# Patient Record
Sex: Male | Born: 1965 | Race: White | Hispanic: No | Marital: Single | Smoking: Former smoker
Health system: Southern US, Community
[De-identification: ages and names within clinical notes are randomized; demographics above are authoritative.]

## PROBLEM LIST (undated history)

## (undated) ENCOUNTER — Emergency Department (HOSPITAL_COMMUNITY): Admission: EM | Payer: Medicaid Other | Source: Home / Self Care

## (undated) DIAGNOSIS — G7111 Myotonic muscular dystrophy: Secondary | ICD-10-CM

## (undated) DIAGNOSIS — I10 Essential (primary) hypertension: Secondary | ICD-10-CM

## (undated) DIAGNOSIS — R1310 Dysphagia, unspecified: Secondary | ICD-10-CM

## (undated) DIAGNOSIS — R066 Hiccough: Secondary | ICD-10-CM

## (undated) DIAGNOSIS — I82409 Acute embolism and thrombosis of unspecified deep veins of unspecified lower extremity: Secondary | ICD-10-CM

## (undated) DIAGNOSIS — Z87442 Personal history of urinary calculi: Secondary | ICD-10-CM

## (undated) HISTORY — PX: CHOLECYSTECTOMY: SHX55

## (undated) HISTORY — DX: Hiccough: R06.6

## (undated) HISTORY — DX: Essential (primary) hypertension: I10

## (undated) HISTORY — DX: Myotonic muscular dystrophy: G71.11

---

## 2000-04-28 ENCOUNTER — Encounter (INDEPENDENT_AMBULATORY_CARE_PROVIDER_SITE_OTHER): Payer: Self-pay | Admitting: *Deleted

## 2000-04-28 ENCOUNTER — Encounter: Payer: Self-pay | Admitting: Surgery

## 2000-04-28 ENCOUNTER — Ambulatory Visit (HOSPITAL_COMMUNITY): Admission: RE | Admit: 2000-04-28 | Discharge: 2000-04-29 | Payer: Self-pay | Admitting: Surgery

## 2002-05-15 ENCOUNTER — Ambulatory Visit (HOSPITAL_COMMUNITY): Admission: RE | Admit: 2002-05-15 | Discharge: 2002-05-15 | Payer: Self-pay | Admitting: Internal Medicine

## 2002-05-15 ENCOUNTER — Encounter: Payer: Self-pay | Admitting: Internal Medicine

## 2005-11-01 HISTORY — PX: MULTIPLE TOOTH EXTRACTIONS: SHX2053

## 2006-10-17 ENCOUNTER — Ambulatory Visit: Payer: Self-pay | Admitting: Family Medicine

## 2006-10-27 ENCOUNTER — Ambulatory Visit: Payer: Self-pay | Admitting: *Deleted

## 2006-11-14 ENCOUNTER — Ambulatory Visit: Payer: Self-pay | Admitting: Family Medicine

## 2007-09-15 ENCOUNTER — Ambulatory Visit: Payer: Self-pay | Admitting: Internal Medicine

## 2007-09-15 LAB — CONVERTED CEMR LAB
ALT: 27 units/L (ref 0–53)
AST: 28 units/L (ref 0–37)
Albumin: 4.7 g/dL (ref 3.5–5.2)
Alkaline Phosphatase: 78 units/L (ref 39–117)
Anti Nuclear Antibody(ANA): NEGATIVE
BUN: 10 mg/dL (ref 6–23)
Basophils Absolute: 0 10*3/uL (ref 0.0–0.1)
Basophils Relative: 1 % (ref 0–1)
CO2: 25 meq/L (ref 19–32)
Calcium: 9.8 mg/dL (ref 8.4–10.5)
Chloride: 107 meq/L (ref 96–112)
Creatinine, Ser: 0.6 mg/dL (ref 0.40–1.50)
Eosinophils Absolute: 0 10*3/uL — ABNORMAL LOW (ref 0.2–0.7)
Eosinophils Relative: 1 % (ref 0–5)
Glucose, Bld: 84 mg/dL (ref 70–99)
HCT: 49.8 % (ref 39.0–52.0)
Hemoglobin: 15.7 g/dL (ref 13.0–17.0)
Lymphocytes Relative: 33 % (ref 12–46)
Lymphs Abs: 1.2 10*3/uL (ref 0.7–4.0)
MCHC: 31.5 g/dL (ref 30.0–36.0)
MCV: 96.7 fL (ref 78.0–100.0)
Monocytes Absolute: 0.3 10*3/uL (ref 0.1–1.0)
Monocytes Relative: 7 % (ref 3–12)
Neutro Abs: 2.2 10*3/uL (ref 1.7–7.7)
Neutrophils Relative %: 59 % (ref 43–77)
Platelets: 286 10*3/uL (ref 150–400)
Potassium: 5.4 meq/L — ABNORMAL HIGH (ref 3.5–5.3)
RBC: 5.15 M/uL (ref 4.22–5.81)
RDW: 15.5 % (ref 11.5–15.5)
Sed Rate: 10 mm/hr (ref 0–16)
Sodium: 145 meq/L (ref 135–145)
TSH: 0.886 microintl units/mL (ref 0.350–5.50)
Total Bilirubin: 0.6 mg/dL (ref 0.3–1.2)
Total Protein: 6.8 g/dL (ref 6.0–8.3)
Vitamin B-12: 223 pg/mL (ref 211–911)
WBC: 3.8 10*3/uL — ABNORMAL LOW (ref 4.0–10.5)

## 2007-09-18 ENCOUNTER — Ambulatory Visit: Payer: Self-pay | Admitting: Internal Medicine

## 2007-09-24 ENCOUNTER — Ambulatory Visit (HOSPITAL_COMMUNITY): Admission: RE | Admit: 2007-09-24 | Discharge: 2007-09-24 | Payer: Self-pay | Admitting: Family Medicine

## 2007-10-02 ENCOUNTER — Encounter (INDEPENDENT_AMBULATORY_CARE_PROVIDER_SITE_OTHER): Payer: Self-pay | Admitting: Family Medicine

## 2007-10-02 ENCOUNTER — Ambulatory Visit: Payer: Self-pay | Admitting: Internal Medicine

## 2007-10-02 LAB — CONVERTED CEMR LAB
CK-MB: 3.6 ng/mL (ref 0.3–4.0)
Cortisol, Plasma: 6 ug/dL
Relative Index: 1.8 (ref 0.0–2.5)
Rhuematoid fact SerPl-aCnc: <20 intl units/mL (ref 0–20)
Total CK: 198 units/L (ref 7–232)

## 2007-10-03 ENCOUNTER — Ambulatory Visit (HOSPITAL_COMMUNITY): Admission: RE | Admit: 2007-10-03 | Discharge: 2007-10-03 | Payer: Self-pay | Admitting: Family Medicine

## 2007-11-06 ENCOUNTER — Ambulatory Visit: Payer: Self-pay | Admitting: Internal Medicine

## 2007-11-07 ENCOUNTER — Encounter (INDEPENDENT_AMBULATORY_CARE_PROVIDER_SITE_OTHER): Payer: Self-pay | Admitting: Internal Medicine

## 2007-11-07 LAB — CONVERTED CEMR LAB
ALT: 18 units/L (ref 0–53)
AST: 22 units/L (ref 0–37)
Albumin: 4.5 g/dL (ref 3.5–5.2)
Alkaline Phosphatase: 88 units/L (ref 39–117)
BUN: 10 mg/dL (ref 6–23)
Basophils Absolute: 0 10*3/uL (ref 0.0–0.1)
Basophils Relative: 0 % (ref 0–1)
CO2: 20 meq/L (ref 19–32)
Calcium: 9.9 mg/dL (ref 8.4–10.5)
Chloride: 107 meq/L (ref 96–112)
Creatinine, Ser: 0.58 mg/dL (ref 0.40–1.50)
Eosinophils Absolute: 0 10*3/uL (ref 0.0–0.7)
Eosinophils Relative: 0 % (ref 0–5)
Glucose, Bld: 88 mg/dL (ref 70–99)
HCT: 49.7 % (ref 39.0–52.0)
Hemoglobin: 15.8 g/dL (ref 13.0–17.0)
Lymphocytes Relative: 29 % (ref 12–46)
Lymphs Abs: 2.2 10*3/uL (ref 0.7–4.0)
MCHC: 31.8 g/dL (ref 30.0–36.0)
MCV: 93.6 fL (ref 78.0–100.0)
Monocytes Absolute: 0.3 10*3/uL (ref 0.1–1.0)
Monocytes Relative: 4 % (ref 3–12)
Neutro Abs: 5 10*3/uL (ref 1.7–7.7)
Neutrophils Relative %: 67 % (ref 43–77)
Platelets: 226 10*3/uL (ref 150–400)
Potassium: 4.5 meq/L (ref 3.5–5.3)
RBC: 5.31 M/uL (ref 4.22–5.81)
RDW: 16.4 % — ABNORMAL HIGH (ref 11.5–15.5)
Sodium: 143 meq/L (ref 135–145)
Total Bilirubin: 0.4 mg/dL (ref 0.3–1.2)
Total Protein: 7.2 g/dL (ref 6.0–8.3)
WBC: 7.5 10*3/uL (ref 4.0–10.5)

## 2007-12-11 ENCOUNTER — Ambulatory Visit: Payer: Self-pay | Admitting: Internal Medicine

## 2008-01-24 ENCOUNTER — Encounter (INDEPENDENT_AMBULATORY_CARE_PROVIDER_SITE_OTHER): Payer: Self-pay | Admitting: Family Medicine

## 2008-01-24 ENCOUNTER — Ambulatory Visit: Payer: Self-pay | Admitting: Internal Medicine

## 2008-01-24 LAB — CONVERTED CEMR LAB: Helicobacter Pylori Antibody-IgG: <0.4

## 2008-04-26 ENCOUNTER — Ambulatory Visit: Payer: Self-pay | Admitting: Internal Medicine

## 2008-04-26 LAB — CONVERTED CEMR LAB
Basophils Absolute: 0 10*3/uL (ref 0.0–0.1)
Basophils Relative: 1 % (ref 0–1)
Eosinophils Absolute: 0.1 10*3/uL (ref 0.0–0.7)
Eosinophils Relative: 1 % (ref 0–5)
HCT: 44.8 % (ref 39.0–52.0)
HCV Ab: NEGATIVE
Hemoglobin: 14.3 g/dL (ref 13.0–17.0)
Hep A Total Ab: NEGATIVE
Hep B Core Total Ab: NEGATIVE
Hep B E Ab: NEGATIVE
Hep B S Ab: NEGATIVE
Lymphocytes Relative: 32 % (ref 12–46)
Lymphs Abs: 1.9 10*3/uL (ref 0.7–4.0)
MCHC: 31.9 g/dL (ref 30.0–36.0)
MCV: 94.1 fL (ref 78.0–100.0)
Monocytes Absolute: 0.4 10*3/uL (ref 0.1–1.0)
Monocytes Relative: 7 % (ref 3–12)
Neutro Abs: 3.7 10*3/uL (ref 1.7–7.7)
Neutrophils Relative %: 60 % (ref 43–77)
Platelets: 258 10*3/uL (ref 150–400)
RBC: 4.76 M/uL (ref 4.22–5.81)
RDW: 15.6 % — ABNORMAL HIGH (ref 11.5–15.5)
WBC: 6.1 10*3/uL (ref 4.0–10.5)

## 2010-05-01 ENCOUNTER — Encounter (INDEPENDENT_AMBULATORY_CARE_PROVIDER_SITE_OTHER): Payer: Self-pay | Admitting: Internal Medicine

## 2010-05-01 ENCOUNTER — Ambulatory Visit: Payer: Self-pay | Admitting: Internal Medicine

## 2010-05-01 ENCOUNTER — Inpatient Hospital Stay (HOSPITAL_COMMUNITY)
Admission: EM | Admit: 2010-05-01 | Discharge: 2010-05-02 | Payer: Self-pay | Source: Home / Self Care | Admitting: Emergency Medicine

## 2010-11-22 ENCOUNTER — Encounter: Payer: Self-pay | Admitting: Family Medicine

## 2010-12-26 ENCOUNTER — Inpatient Hospital Stay (INDEPENDENT_AMBULATORY_CARE_PROVIDER_SITE_OTHER)
Admission: RE | Admit: 2010-12-26 | Discharge: 2010-12-26 | Disposition: A | Discharge status: Home / Self Care | Payer: Self-pay | Source: Ambulatory Visit | Attending: Family Medicine | Admitting: Family Medicine

## 2010-12-26 DIAGNOSIS — S0990XA Unspecified injury of head, initial encounter: Secondary | ICD-10-CM

## 2011-01-17 LAB — CBC
HCT: 40.8 % (ref 39.0–52.0)
HCT: 44.8 % (ref 39.0–52.0)
Hemoglobin: 13.7 g/dL (ref 13.0–17.0)
Hemoglobin: 15.3 g/dL (ref 13.0–17.0)
MCH: 31.6 pg (ref 26.0–34.0)
MCH: 31.6 pg (ref 26.0–34.0)
MCHC: 33.6 g/dL (ref 30.0–36.0)
MCHC: 34.2 g/dL (ref 30.0–36.0)
MCV: 92.6 fL (ref 78.0–100.0)
MCV: 94 fL (ref 78.0–100.0)
Platelets: 196 10*3/uL (ref 150–400)
Platelets: 210 10*3/uL (ref 150–400)
RBC: 4.34 MIL/uL (ref 4.22–5.81)
RBC: 4.83 MIL/uL (ref 4.22–5.81)
RDW: 15.4 % (ref 11.5–15.5)
RDW: 15.4 % (ref 11.5–15.5)
WBC: 6.7 10*3/uL (ref 4.0–10.5)
WBC: 6.9 10*3/uL (ref 4.0–10.5)

## 2011-01-17 LAB — CULTURE, BLOOD (ROUTINE X 2)
Culture: NO GROWTH
Culture: NO GROWTH

## 2011-01-17 LAB — HIV ANTIBODY (ROUTINE TESTING W REFLEX): HIV: NONREACTIVE

## 2011-01-17 LAB — POCT CARDIAC MARKERS
CKMB, poc: 2.9 ng/mL (ref 1.0–8.0)
Myoglobin, poc: 206 ng/mL (ref 12–200)
Troponin i, poc: <0.05 ng/mL (ref 0.00–0.09)

## 2011-01-17 LAB — COMPREHENSIVE METABOLIC PANEL
ALT: 56 U/L — ABNORMAL HIGH (ref 0–53)
AST: 45 U/L — ABNORMAL HIGH (ref 0–37)
Albumin: 3.6 g/dL (ref 3.5–5.2)
Alkaline Phosphatase: 109 U/L (ref 39–117)
BUN: 10 mg/dL (ref 6–23)
CO2: 26 mEq/L (ref 19–32)
Calcium: 9.5 mg/dL (ref 8.4–10.5)
Chloride: 106 mEq/L (ref 96–112)
Creatinine, Ser: 0.57 mg/dL (ref 0.4–1.5)
GFR calc Af Amer: >60 mL/min (ref >60)
GFR calc non Af Amer: >60 mL/min (ref >60)
Glucose, Bld: 94 mg/dL (ref 70–99)
Potassium: 4.8 mEq/L (ref 3.5–5.1)
Sodium: 141 mEq/L (ref 135–145)
Total Bilirubin: 1.1 mg/dL (ref 0.3–1.2)
Total Protein: 6.1 g/dL (ref 6.0–8.3)

## 2011-01-17 LAB — HEPATIC FUNCTION PANEL
ALT: 47 U/L (ref 0–53)
AST: 30 U/L (ref 0–37)
Albumin: 3.3 g/dL — ABNORMAL LOW (ref 3.5–5.2)
Alkaline Phosphatase: 99 U/L (ref 39–117)
Bilirubin, Direct: <0.1 mg/dL (ref 0.0–0.3)
Total Bilirubin: 0.5 mg/dL (ref 0.3–1.2)
Total Protein: 5.7 g/dL — ABNORMAL LOW (ref 6.0–8.3)

## 2011-01-17 LAB — T4, FREE: Free T4: 0.98 ng/dL (ref 0.80–1.80)

## 2011-01-17 LAB — DIFFERENTIAL
Basophils Absolute: 0 10*3/uL (ref 0.0–0.1)
Basophils Relative: 1 % (ref 0–1)
Eosinophils Absolute: 0.1 10*3/uL (ref 0.0–0.7)
Eosinophils Relative: 1 % (ref 0–5)
Lymphocytes Relative: 34 % (ref 12–46)
Lymphs Abs: 2.3 10*3/uL (ref 0.7–4.0)
Monocytes Absolute: 0.4 10*3/uL (ref 0.1–1.0)
Monocytes Relative: 6 % (ref 3–12)
Neutro Abs: 4 10*3/uL (ref 1.7–7.7)
Neutrophils Relative %: 59 % (ref 43–77)

## 2011-01-17 LAB — TSH: TSH: 0.582 u[IU]/mL (ref 0.350–4.500)

## 2011-01-17 LAB — URINALYSIS, ROUTINE W REFLEX MICROSCOPIC
Bilirubin Urine: NEGATIVE
Glucose, UA: NEGATIVE mg/dL
Hgb urine dipstick: NEGATIVE
Ketones, ur: NEGATIVE mg/dL
Nitrite: NEGATIVE
Protein, ur: NEGATIVE mg/dL
Specific Gravity, Urine: 1.008 (ref 1.005–1.030)
Urobilinogen, UA: 0.2 mg/dL (ref 0.0–1.0)
pH: 7.5 (ref 5.0–8.0)

## 2011-01-17 LAB — LIPID PANEL
Cholesterol: 153 mg/dL (ref 0–200)
HDL: 41 mg/dL (ref >39)
LDL Cholesterol: 85 mg/dL (ref 0–99)
Total CHOL/HDL Ratio: 3.7 RATIO
Triglycerides: 134 mg/dL (ref <150)
VLDL: 27 mg/dL (ref 0–40)

## 2011-01-17 LAB — CK TOTAL AND CKMB (NOT AT ARMC)
CK, MB: 3.7 ng/mL (ref 0.3–4.0)
Relative Index: 2.8 — ABNORMAL HIGH (ref 0.0–2.5)
Total CK: 132 U/L (ref 7–232)

## 2011-01-17 LAB — D-DIMER, QUANTITATIVE: D-Dimer, Quant: 0.29 ug/mL-FEU (ref 0.00–0.48)

## 2011-01-17 LAB — URINE CULTURE
Colony Count: NO GROWTH
Culture: NO GROWTH

## 2011-01-17 LAB — CARDIAC PANEL(CRET KIN+CKTOT+MB+TROPI)
CK, MB: 3.7 ng/mL (ref 0.3–4.0)
CK, MB: 4.1 ng/mL — ABNORMAL HIGH (ref 0.3–4.0)
Relative Index: 2.8 — ABNORMAL HIGH (ref 0.0–2.5)
Relative Index: 3.6 — ABNORMAL HIGH (ref 0.0–2.5)
Total CK: 115 U/L (ref 7–232)
Total CK: 133 U/L (ref 7–232)
Troponin I: 0.02 ng/mL (ref 0.00–0.06)
Troponin I: 0.03 ng/mL (ref 0.00–0.06)

## 2011-01-17 LAB — LIPASE, BLOOD: Lipase: 37 U/L (ref 11–59)

## 2011-01-17 LAB — LACTIC ACID, PLASMA: Lactic Acid, Venous: 2 mmol/L (ref 0.5–2.2)

## 2011-01-17 LAB — PROTIME-INR
INR: 0.88 (ref 0.00–1.49)
Prothrombin Time: 11.9 seconds (ref 11.6–15.2)

## 2011-01-17 LAB — T3, FREE: T3, Free: 2.8 pg/mL (ref 2.3–4.2)

## 2011-01-17 LAB — TROPONIN I: Troponin I: 0.01 ng/mL (ref 0.00–0.06)

## 2011-01-17 LAB — APTT: aPTT: 26 seconds (ref 24–37)

## 2011-03-19 NOTE — Op Note (Signed)
[place]  Patient:    Malik [name], Malik [name]                     MRN: [id number] Proc. Date: 04/28/00 Adm. Date:  [id number] Attending:  Charlton Haws CC:         Elvina Sidle, M.D.                           Operative Report  CCS#: [id number]  PREOPERATIVE DIAGNOSIS:  Chronic calculus cholecystitis.  POSTOPERATIVE DIAGNOSIS:  Chronic calculus cholecystitis.  OPERATION:  Laparoscopic cholecystectomy.  SURGEON:  Currie Paris, M.D.  ASSISTANT:  Velora Heckler, M.D.  ANESTHESIA:  General endotracheal.  CLINICAL HISTORY:  This patient is a 45 year old male with some symptoms typical for biliary colic.  He had a gallbladder with multiple gallstones and was contracted around the gallstones.  DESCRIPTION OF PROCEDURE:  The patient was brought to the operating room. After satisfactory general endotracheal anesthesia had been obtained, the abdomen was prepped and draped as a sterile field, and 0.25% plain Xylocaine was infiltrated around the umbilicus.  An incision was made, and the fascia was picked up and entered.  A Hasson was introduced.  The abdomen was insufflated to 15.  No gross abnormalities, other than some adhesions around the gallbladder, were noted.  The ______ were placed in the usual positions, and the patient was placed in the reverse Trendelenburg.  The gallbladder was grasped, and the cystic duct area was dissected out.  The gallbladder was contracted around multiple stones, and there was some sclerotic tissue here consistent with old, chronic cholecystitis.  I was able to identify the cystic duct and its junction with the gallbladder, and down to the common duct.  I could see the cystic artery with what appeared to be the right hepatic arching up a little bit towards the gallbladder.  This was all dissected out and nicely identified with the peritoneum opened on both sides of the gallbladder to see the Triangle of  Calot well.  Once the anatomy appeared assured, we put two clips on the stay side of the gallbladder, two on the go side, and divided the cystic duct.  The cystic artery was likewise divided.  The gallbladder was removed from below to above with cautery.  A few bleeding areas were coagulated at the end to make sure that everything was dry.  The gallbladder was brought out through the umbilical port.  I had to make the incision a little bit bigger because of so many stones in the gallbladder, but we got the gallbladder out intact.  The abdomen was re-insufflated and checked for hemostasis to make sure that everything was dry.  Again, no blood had accumulated, and we suctioned out the irrigant.  The lateral port was removed and remained dry.  The purse-string was closed at the umbilicus, but there was still a little bit of a gap where we had opened it a little longer, so I closed that with another suture of #0 Vicryl.  The skin was closed with 4-0 Monocryl subcuticular after removing the final epigastric trocar and deflating the abdomen.  The patient tolerated the procedure well.  There were no operative complications.  All counts were correct. DD:  04/28/00 TD:  04/29/00 Job: [id number]

## 2011-04-16 ENCOUNTER — Encounter: Payer: Self-pay | Admitting: Family Medicine

## 2011-04-16 ENCOUNTER — Ambulatory Visit (INDEPENDENT_AMBULATORY_CARE_PROVIDER_SITE_OTHER): Payer: Medicaid Other | Admitting: Family Medicine

## 2011-04-16 VITALS — BP 118/81 | HR 78 | Temp 97.6°F | Ht 68.0 in | Wt 124.0 lb

## 2011-04-16 DIAGNOSIS — R638 Other symptoms and signs concerning food and fluid intake: Secondary | ICD-10-CM | POA: Insufficient documentation

## 2011-04-16 DIAGNOSIS — F17200 Nicotine dependence, unspecified, uncomplicated: Secondary | ICD-10-CM

## 2011-04-16 DIAGNOSIS — Z87891 Personal history of nicotine dependence: Secondary | ICD-10-CM | POA: Insufficient documentation

## 2011-04-16 DIAGNOSIS — R634 Abnormal weight loss: Secondary | ICD-10-CM

## 2011-04-16 DIAGNOSIS — M6281 Muscle weakness (generalized): Secondary | ICD-10-CM

## 2011-04-16 DIAGNOSIS — Z720 Tobacco use: Secondary | ICD-10-CM

## 2011-04-16 LAB — CBC WITH DIFFERENTIAL/PLATELET
Basophils Absolute: 0 10*3/uL (ref 0.0–0.1)
Basophils Relative: 0 % (ref 0–1)
Eosinophils Absolute: 0.1 10*3/uL (ref 0.0–0.7)
Eosinophils Relative: 1 % (ref 0–5)
HCT: 47.6 % (ref 39.0–52.0)
Hemoglobin: 15.8 g/dL (ref 13.0–17.0)
Lymphocytes Relative: 28 % (ref 12–46)
Lymphs Abs: 1.7 10*3/uL (ref 0.7–4.0)
MCH: 31.5 pg (ref 26.0–34.0)
MCHC: 33.2 g/dL (ref 30.0–36.0)
MCV: 95 fL (ref 78.0–100.0)
Monocytes Absolute: 0.3 10*3/uL (ref 0.1–1.0)
Monocytes Relative: 5 % (ref 3–12)
Neutro Abs: 4 10*3/uL (ref 1.7–7.7)
Neutrophils Relative %: 66 % (ref 43–77)
Platelets: 227 10*3/uL (ref 150–400)
RBC: 5.01 MIL/uL (ref 4.22–5.81)
RDW: 15.6 % — ABNORMAL HIGH (ref 11.5–15.5)
WBC: 6.1 10*3/uL (ref 4.0–10.5)

## 2011-04-16 LAB — URINALYSIS
Bilirubin Urine: NEGATIVE
Glucose, UA: NEGATIVE mg/dL
Hgb urine dipstick: NEGATIVE
Ketones, ur: NEGATIVE mg/dL
Leukocytes, UA: NEGATIVE
Nitrite: NEGATIVE
Protein, ur: NEGATIVE mg/dL
Specific Gravity, Urine: 1.024 (ref 1.005–1.030)
Urobilinogen, UA: 0.2 mg/dL (ref 0.0–1.0)
pH: 6 (ref 5.0–8.0)

## 2011-04-16 LAB — TSH: TSH: 1.042 u[IU]/mL (ref 0.350–4.500)

## 2011-04-16 LAB — COMPREHENSIVE METABOLIC PANEL
ALT: 14 U/L (ref 0–53)
AST: 19 U/L (ref 0–37)
Albumin: 4.6 g/dL (ref 3.5–5.2)
Alkaline Phosphatase: 71 U/L (ref 39–117)
BUN: 12 mg/dL (ref 6–23)
CO2: 30 mEq/L (ref 19–32)
Calcium: 10.2 mg/dL (ref 8.4–10.5)
Chloride: 110 mEq/L (ref 96–112)
Creat: 0.57 mg/dL (ref 0.50–1.35)
Glucose, Bld: 89 mg/dL (ref 70–99)
Potassium: 4.3 mEq/L (ref 3.5–5.3)
Sodium: 146 mEq/L — ABNORMAL HIGH (ref 135–145)
Total Bilirubin: 0.6 mg/dL (ref 0.3–1.2)
Total Protein: 6.9 g/dL (ref 6.0–8.3)

## 2011-04-16 LAB — CK: Total CK: 176 U/L (ref 7–232)

## 2011-04-16 LAB — RPR

## 2011-04-16 LAB — HIV ANTIBODY (ROUTINE TESTING W REFLEX): HIV: NONREACTIVE

## 2011-04-16 NOTE — Assessment & Plan Note (Signed)
I feel this and the weakness are connected.  Please see A/P for Weakness.

## 2011-04-16 NOTE — Patient Instructions (Signed)
It was nice to meet you.  I am sorry you have been feeling weak so long.  I am going to draw some lab tests today to see if I can find out what is causing your weakness and weight loss.   I am going to do a TB skin test.  You have to come back on Monday to have it checked by a nurse.  Please let the front office know you need a nurse visit for Monday.  I am going to send you home with some stool cards, these are to test your stool for blood.   I want you to get a Chest X-ray done, it can be done at [facility] on the first floor in the radiology department, or it can be done at the [facility] Imaging office on [address].   Please make an appointment on your way out to see me again in one week (next Thursday or Friday).  We will go over your labs then and decide what the next step is.

## 2011-04-16 NOTE — Assessment & Plan Note (Signed)
Pt appears cachectic and ill, extreme weakness abnormal for age.  Differential wide and symptoms non-specific. Ddx includes Cancer (most concerned for lung vs. Colon), Inflammatory bowel disease, HIV, Syphilis, Hepatitis, TB, or some sort of autoimmune disease, especially a myositis.  Will draw initial labs today.    Will have pt follow up in one week to go over labs and decide next part of work up.

## 2011-04-16 NOTE — Progress Notes (Signed)
  Subjective:    Patient ID: Malik [name], male    DOB: [birth year], 45 y.o.   MRN: [id number]  HPI  Patient presents to establish care.  He complains that he is having a lot of trouble with weakness.  He has been getting gradually weaker for the past 2 years.  He has also lost weight in the past two years, at least 20 lbs.  He says he has had a few falls because of his weakness.  The weakness is in all large muscle groups.  He has difficulty walking, is now walking with a cane.  He cannot stand from sitting position without pushing off of something.    [facility] but his doctor left and he has not been evaluated for his weight loss and weakness.    Review of Systems  Constitutional: Positive for fatigue and unexpected weight change. Negative for fever and chills.  HENT: Negative for rhinorrhea.   Eyes: Negative for visual disturbance.  Respiratory: Negative for shortness of breath.   Cardiovascular: Negative for chest pain.  Gastrointestinal: Positive for diarrhea, blood in stool and abdominal distention.       Pt states he that about 6 months ago he had blood in his stool about once a month for 3-4 months.  He says currently he is having loose stools every day and that beans and any junk food in particular bother him.   Genitourinary: Negative for dysuria, discharge and difficulty urinating.  Musculoskeletal: Negative for myalgias and arthralgias.  Skin: Negative for rash.  Neurological: Positive for weakness. Negative for dizziness and numbness.  Hematological: Negative for adenopathy. Does not bruise/bleed easily.       Objective:   Physical Exam  Constitutional: Vital signs are normal. He appears cachectic. He is cooperative.  Non-toxic appearance. He has a sickly appearance.  HENT:  Mouth/Throat: He does not have dentures. Abnormal dentition.       Tongue with white film that does not scrape off.   Eyes: EOM are normal. Pupils are equal, round, and reactive  to light.  Neck: Neck supple. No thyromegaly present.  Cardiovascular: Normal rate, regular rhythm and normal heart sounds.   Pulmonary/Chest: Effort normal and breath sounds normal.  Abdominal: Soft. Bowel sounds are normal. He exhibits no distension and no mass. There is no tenderness.  Musculoskeletal: He exhibits no edema and no tenderness.       Gait: Pt unable to stand from sitting position without assistance of cane and someone on opposite arm.  Walks with wide gait, stooped over.   Lymphadenopathy:    He has no cervical adenopathy.    He has no axillary adenopathy.  Neurological: He is alert. He has normal reflexes. He displays atrophy. No cranial nerve deficit or sensory deficit. Gait abnormal. Coordination normal. GCS eye subscore is 4. GCS verbal subscore is 5. GCS motor subscore is 6.       Strength 3/5 in upper and lower extremities, both flexor and extensor muscle groups bilaterally.   Skin: No rash noted.          Assessment & Plan:

## 2011-04-17 LAB — SEDIMENTATION RATE: Sed Rate: 4 mm/hr (ref 0–16)

## 2011-04-19 ENCOUNTER — Ambulatory Visit
Admission: RE | Admit: 2011-04-19 | Discharge: 2011-04-19 | Disposition: A | Discharge status: Home / Self Care | Payer: Medicaid Other | Source: Ambulatory Visit | Attending: Family Medicine | Admitting: Family Medicine

## 2011-04-19 ENCOUNTER — Ambulatory Visit (INDEPENDENT_AMBULATORY_CARE_PROVIDER_SITE_OTHER): Payer: Medicaid Other | Admitting: *Deleted

## 2011-04-19 DIAGNOSIS — R634 Abnormal weight loss: Secondary | ICD-10-CM

## 2011-04-19 DIAGNOSIS — Z111 Encounter for screening for respiratory tuberculosis: Secondary | ICD-10-CM

## 2011-04-19 DIAGNOSIS — IMO0001 Reserved for inherently not codable concepts without codable children: Secondary | ICD-10-CM

## 2011-04-19 DIAGNOSIS — M6281 Muscle weakness (generalized): Secondary | ICD-10-CM

## 2011-04-19 LAB — TB SKIN TEST
Induration: 0
TB Skin Test: NEGATIVE mm

## 2011-04-23 ENCOUNTER — Ambulatory Visit (INDEPENDENT_AMBULATORY_CARE_PROVIDER_SITE_OTHER): Payer: Medicaid Other | Admitting: Family Medicine

## 2011-04-23 ENCOUNTER — Encounter: Payer: Self-pay | Admitting: Family Medicine

## 2011-04-23 DIAGNOSIS — Z720 Tobacco use: Secondary | ICD-10-CM

## 2011-04-23 DIAGNOSIS — F17200 Nicotine dependence, unspecified, uncomplicated: Secondary | ICD-10-CM

## 2011-04-23 DIAGNOSIS — R634 Abnormal weight loss: Secondary | ICD-10-CM

## 2011-04-23 DIAGNOSIS — M6281 Muscle weakness (generalized): Secondary | ICD-10-CM

## 2011-04-23 LAB — VITAMIN B12: Vitamin B-12: 194 pg/mL — ABNORMAL LOW (ref 211–911)

## 2011-04-23 NOTE — Patient Instructions (Addendum)
It was good to see you.  I am sorry we have not found out what is causing your weakness yet.  I am referring you to see the Neurologist and the Physical therapist. The office will call you with those appointments.  I will call you when I see the results of your labs.  I am going to give you another set of stool cards to test your stool for blood.  Please bring those back to the office (to the lab) to be tested within the next few weeks.  Please call the office with any worsening symptoms or questions or concerns. I am giving you a prescription for an ankle brace, I want you to go to the [facility] to have it made.

## 2011-04-23 NOTE — Assessment & Plan Note (Signed)
On further history taking, Patient's symptoms have been going on longer than initially stated, perhaps 10 years, but with a very gradual onset of weakness, not truly noticeable until 2 or 2 1/2 years ago.  Patient with normal CBC, CMET (including liver function, renal function, and albumin), PPD, HIV, RPR, ESR, TSH, CK, UA, and CXR.  Etiology unclear at this time, but Infectious etiology (TB, HIV, Syphilis) ruled out, Poor nutritional status or malabsorption syndromes much less likely considering normal albumin and serum protein, Liver disease (including hepatitis) less likely, Autoimmune syndromes less likely due to normal ESR and CK, TSH, and WBC count. Cardio pulmonary causes (including CHF and Lung metastasis) unlikely due to normal CXR.  Will give patient another set of stool cards as colon cancer could still be a possibility, but less likely considering WBC, Hemoglobin, and inflammatory markers normal.  Will order UDS, Vitamin D, and Vitamin B 12, while I do not think any of these labs could fully explain patients symptoms, they could be contributing factors.  Will refer patient to Neurology for continued work up. He could benefit from an EMG study.  Gave Rx Referal to Production assistant, radio for an AFO for left foot to help with pronation and help prevent falls.  Will refer patient to Physical Therapy- while etiology of weakness unclear at this time the patient would likely benefit from PT, especially considering he states his biggest fear is "being stuck in a wheel chair the rest of his life."

## 2011-04-23 NOTE — Assessment & Plan Note (Signed)
Please see weakness for A & P.

## 2011-04-23 NOTE — Progress Notes (Signed)
Subjective:    Patient ID: Malik [name], male    DOB: 11-Mar-1966, 45 y.o.   MRN: [id number]  HPI  Patient presents for follow up of his weakness and to review lab work.  He has had no changes in the last week in his weakness or functional status.  Patient says he has never had elevated BP before but admits to feeling anxious about labs.    Labs reviewed, pt notified they were all WNL.  Patient states he lost his stool cards, but says he will bring them in if we give him another set.    On further questioning, patient mentions that he has been in jail a few times, last time for about  9 years, he was released in 2000.  He says he was in for theft.  He reports that the toothpaste he got in there was not very good, and that is what he thinks caused his teeth to rot.  He says that when he got out of jail he worked at a [employer] and had some trouble moving heavy (30 lb) boxes, but no trouble with walking.  Patient states his mom died about 2 or 2 1/2 years ago, and since then is when he really started to notice being weak, and having trouble walking.  He admits to no real spinal, neck, knee, ankle injury.  He says sometimes when he is on his feet for a long time his knee and ankle (left greater than right) will hurt, but admits to no muscle pains.    Review of Systems  Constitutional: Negative for fever and activity change.  HENT: Negative for neck pain.   Eyes: Negative for visual disturbance.  Respiratory: Negative for cough.   Cardiovascular: Negative for chest pain.  Gastrointestinal: Positive for diarrhea.  Genitourinary: Negative for hematuria and difficulty urinating.  Musculoskeletal: Positive for arthralgias and gait problem. Negative for myalgias and joint swelling.  Skin: Negative for rash and wound.  Neurological: Positive for weakness. Negative for dizziness.  Hematological: Negative for adenopathy. Does not bruise/bleed easily.       Objective:   Physical Exam  Vitals  reviewed. Constitutional: He appears cachectic.  Eyes: EOM are normal. Pupils are equal, round, and reactive to light.  Neck: Neck supple. No thyromegaly present.  Cardiovascular: Normal rate, regular rhythm and normal heart sounds.   Pulmonary/Chest: Effort normal and breath sounds normal. He has no wheezes. He has no rales.  Abdominal: Soft. Bowel sounds are normal. He exhibits no distension and no mass.  Musculoskeletal:       Patient with no joint tenderness or effusions of knees or ankles.  Atrophy of muscles.  Left foot with significant pronation.   Gait: Patient must use both arms on counter to stand.  He walks with wide based gait, with left ankle pronating when weight on that foot.   Lymphadenopathy:    He has no cervical adenopathy.  Neurological: He is alert. He has normal reflexes. He displays atrophy.       Strength 3/5 in upper extremities proximally, 4/5 distally.  4/5 in lower extremities both proximally and distally.   Skin: No rash noted.          Assessment & Plan:  Weakness and weight loss:   On further history taking, Patient's symptoms have been going on longer than initially stated, perhaps 10 years, but with a very gradual onset of weakness, not truly noticeable until 2 or 2 1/2 years ago.   Patient with  normal CBC, CMET (including liver function, renal function, and albumin), PPD, HIV, RPR, ESR, TSH, CK, UA, and CXR.   Etiology unclear at this time, but Infectious etiology (TB, HIV, Syphilis) ruled out, Poor nutritional status or malabsorption syndromes much less likely considering normal albumin and serum protein, Liver disease (including hepatitis) less likely, Autoimmune syndromes less likely due to normal ESR and CK, TSH, and WBC count.  Cardio pulmonary causes (including CHF and Lung metastasis) unlikely due to normal CXR.    Will give patient another set of stool cards as colon cancer could still be a possibility, but less likely considering WBC,  Hemoglobin, and inflammatory markers normal.   Will order UDS, Vitamin D, and Vitamin B 12, while I do not think any of these labs could fully explain patients symptoms, they could be contributing factors.   Will refer patient to Neurology for continued work up.  He could benefit from an EMG study.  Gave Rx Referal to Production assistant, radio for an AFO for left foot to help with pronation and help prevent falls.  Will refer patient to Physical Therapy- while etiology of weakness unclear at this time the patient would likely benefit from PT, especially considering he states his biggest fear is "being stuck in a wheel chair the rest of his life."

## 2011-04-24 LAB — DRUG SCREEN, URINE
Amphetamine Screen, Ur: NEGATIVE
Barbiturate Quant, Ur: NEGATIVE
Benzodiazepines.: NEGATIVE
Cocaine Metabolites: NEGATIVE
Creatinine,U: 209.2 mg/dL
Marijuana Metabolite: POSITIVE — AB
Methadone: NEGATIVE
Opiates: NEGATIVE
Phencyclidine (PCP): NEGATIVE
Propoxyphene: NEGATIVE

## 2011-04-24 LAB — VITAMIN D 25 HYDROXY (VIT D DEFICIENCY, FRACTURES): Vit D, 25-Hydroxy: 13 ng/mL — ABNORMAL LOW (ref 30–89)

## 2011-05-18 ENCOUNTER — Encounter: Payer: Self-pay | Admitting: Family Medicine

## 2011-05-18 DIAGNOSIS — G7111 Myotonic muscular dystrophy: Secondary | ICD-10-CM | POA: Insufficient documentation

## 2011-05-27 ENCOUNTER — Ambulatory Visit (INDEPENDENT_AMBULATORY_CARE_PROVIDER_SITE_OTHER): Payer: Medicaid Other

## 2011-05-27 ENCOUNTER — Other Ambulatory Visit (HOSPITAL_COMMUNITY): Payer: Self-pay | Admitting: Radiology

## 2011-05-27 ENCOUNTER — Ambulatory Visit (HOSPITAL_COMMUNITY): Payer: Medicaid Other | Attending: Family Medicine | Admitting: Radiology

## 2011-05-27 DIAGNOSIS — I428 Other cardiomyopathies: Secondary | ICD-10-CM

## 2011-05-27 DIAGNOSIS — F17200 Nicotine dependence, unspecified, uncomplicated: Secondary | ICD-10-CM | POA: Insufficient documentation

## 2011-05-27 DIAGNOSIS — G7111 Myotonic muscular dystrophy: Secondary | ICD-10-CM

## 2011-05-27 DIAGNOSIS — I379 Nonrheumatic pulmonary valve disorder, unspecified: Secondary | ICD-10-CM | POA: Insufficient documentation

## 2011-05-27 DIAGNOSIS — M6281 Muscle weakness (generalized): Secondary | ICD-10-CM | POA: Insufficient documentation

## 2011-05-27 DIAGNOSIS — Z79899 Other long term (current) drug therapy: Secondary | ICD-10-CM

## 2011-05-28 ENCOUNTER — Encounter (HOSPITAL_COMMUNITY): Payer: Self-pay | Admitting: Neurology

## 2011-07-14 ENCOUNTER — Telehealth: Payer: Self-pay | Admitting: *Deleted

## 2011-07-14 ENCOUNTER — Other Ambulatory Visit: Payer: Self-pay | Admitting: Family Medicine

## 2011-07-14 DIAGNOSIS — E538 Deficiency of other specified B group vitamins: Secondary | ICD-10-CM | POA: Insufficient documentation

## 2011-07-14 MED ORDER — CYANOCOBALAMIN 1000 MCG/ML IJ SOLN: INTRAMUSCULAR | Status: DC

## 2011-07-14 NOTE — Progress Notes (Signed)
Addended by: Ardyth Gal on: 07/14/2011 02:32 PM   Modules accepted: Orders

## 2011-07-14 NOTE — Telephone Encounter (Signed)
Patient notified to come in 07/19/2011 to start B12 injections as directed.

## 2011-07-14 NOTE — Progress Notes (Signed)
Received fax from Mr. Jiron's Neurologist, Dr. Terrace Arabia at [facility].  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  The patient has asked to do the B 12 shots at our office as it is more convenient for him.     Plan: Vitamin B 12 1000 mcg IM Once daily x 5 days, Then once weekly x 4 weeks, Then once monthly.

## 2011-07-19 ENCOUNTER — Ambulatory Visit (INDEPENDENT_AMBULATORY_CARE_PROVIDER_SITE_OTHER): Payer: Medicaid Other | Admitting: *Deleted

## 2011-07-19 DIAGNOSIS — E538 Deficiency of other specified B group vitamins: Secondary | ICD-10-CM

## 2011-07-19 MED ORDER — CYANOCOBALAMIN 1000 MCG/ML IJ SOLN
1000.0000 ug | Freq: Once | INTRAMUSCULAR | Status: AC
  Administered 2011-07-19: 1000 ug via INTRAMUSCULAR

## 2011-07-20 ENCOUNTER — Ambulatory Visit (INDEPENDENT_AMBULATORY_CARE_PROVIDER_SITE_OTHER): Payer: Medicaid Other | Admitting: *Deleted

## 2011-07-20 DIAGNOSIS — E538 Deficiency of other specified B group vitamins: Secondary | ICD-10-CM

## 2011-07-20 MED ORDER — CYANOCOBALAMIN 1000 MCG/ML IJ SOLN
1000.0000 ug | Freq: Once | INTRAMUSCULAR | Status: AC
  Administered 2011-07-20: 1000 ug via INTRAMUSCULAR

## 2011-07-21 ENCOUNTER — Ambulatory Visit (INDEPENDENT_AMBULATORY_CARE_PROVIDER_SITE_OTHER): Payer: Medicaid Other | Admitting: *Deleted

## 2011-07-21 VITALS — Temp 97.8°F

## 2011-07-21 DIAGNOSIS — E538 Deficiency of other specified B group vitamins: Secondary | ICD-10-CM

## 2011-07-21 DIAGNOSIS — Z23 Encounter for immunization: Secondary | ICD-10-CM

## 2011-07-21 MED ORDER — CYANOCOBALAMIN 1000 MCG/ML IJ SOLN
1000.0000 ug | Freq: Once | INTRAMUSCULAR | Status: AC
  Administered 2011-07-21: 1000 ug via INTRAMUSCULAR

## 2011-07-22 ENCOUNTER — Ambulatory Visit (INDEPENDENT_AMBULATORY_CARE_PROVIDER_SITE_OTHER): Payer: Medicaid Other | Admitting: *Deleted

## 2011-07-22 DIAGNOSIS — E538 Deficiency of other specified B group vitamins: Secondary | ICD-10-CM

## 2011-07-22 MED ORDER — CYANOCOBALAMIN 1000 MCG/ML IJ SOLN
1000.0000 ug | Freq: Once | INTRAMUSCULAR | Status: AC
  Administered 2011-07-22: 1000 ug via INTRAMUSCULAR

## 2011-07-23 ENCOUNTER — Ambulatory Visit (INDEPENDENT_AMBULATORY_CARE_PROVIDER_SITE_OTHER): Payer: Medicaid Other | Admitting: *Deleted

## 2011-07-23 DIAGNOSIS — E538 Deficiency of other specified B group vitamins: Secondary | ICD-10-CM

## 2011-07-23 MED ORDER — CYANOCOBALAMIN 1000 MCG/ML IJ SOLN
1000.0000 ug | Freq: Once | INTRAMUSCULAR | Status: AC
  Administered 2011-07-23: 1000 ug via INTRAMUSCULAR

## 2011-07-23 NOTE — Progress Notes (Signed)
Patient requests handicap card. Form placed in MD box.

## 2011-07-30 ENCOUNTER — Ambulatory Visit (INDEPENDENT_AMBULATORY_CARE_PROVIDER_SITE_OTHER): Payer: Medicaid Other | Admitting: Family Medicine

## 2011-07-30 ENCOUNTER — Ambulatory Visit: Payer: Medicaid Other

## 2011-07-30 ENCOUNTER — Encounter: Payer: Self-pay | Admitting: Family Medicine

## 2011-07-30 VITALS — BP 120/90 | HR 64 | Temp 98.1°F | Wt 118.0 lb

## 2011-07-30 DIAGNOSIS — R42 Dizziness and giddiness: Secondary | ICD-10-CM

## 2011-07-30 DIAGNOSIS — E538 Deficiency of other specified B group vitamins: Secondary | ICD-10-CM

## 2011-07-30 LAB — COMPREHENSIVE METABOLIC PANEL
ALT: 33 U/L (ref 0–53)
AST: 21 U/L (ref 0–37)
Albumin: 4.3 g/dL (ref 3.5–5.2)
Alkaline Phosphatase: 128 U/L — ABNORMAL HIGH (ref 39–117)
BUN: 14 mg/dL (ref 6–23)
CO2: 24 mEq/L (ref 19–32)
Calcium: 9.8 mg/dL (ref 8.4–10.5)
Chloride: 105 mEq/L (ref 96–112)
Creat: 0.55 mg/dL (ref 0.50–1.35)
Glucose, Bld: 80 mg/dL (ref 70–99)
Potassium: 4.4 mEq/L (ref 3.5–5.3)
Sodium: 144 mEq/L (ref 135–145)
Total Bilirubin: 0.8 mg/dL (ref 0.3–1.2)
Total Protein: 6.9 g/dL (ref 6.0–8.3)

## 2011-07-30 LAB — CBC
HCT: 46.2 % (ref 39.0–52.0)
Hemoglobin: 15.5 g/dL (ref 13.0–17.0)
MCH: 31 pg (ref 26.0–34.0)
MCHC: 33.5 g/dL (ref 30.0–36.0)
MCV: 92.4 fL (ref 78.0–100.0)
Platelets: 261 10*3/uL (ref 150–400)
RBC: 5 MIL/uL (ref 4.22–5.81)
RDW: 15.5 % (ref 11.5–15.5)
WBC: 9.5 10*3/uL (ref 4.0–10.5)

## 2011-07-30 MED ORDER — CYANOCOBALAMIN 1000 MCG/ML IJ SOLN
1000.0000 ug | Freq: Once | INTRAMUSCULAR | Status: AC
  Administered 2011-07-30: 1000 ug via INTRAMUSCULAR

## 2011-07-30 NOTE — Progress Notes (Signed)
Patient in for B12 injection. Complains with dizziness and weakness for 3-4 days. Especially when rising from a lying to sitting position.   Ask Dr. Ashley Royalty to see patient to evaluate.

## 2011-08-02 NOTE — Assessment & Plan Note (Signed)
Orthostatics normal.  Differential includes anemia, hypovolemia, or cardiogenic causes.  Encouraged to increase fluid intake.  Anemia a possibility given B12 deficiency although he did have a normal CBC when checked in June.  I will recheck chemistry and cbc today.  Has MMD listed as diagnosis but he says they are still working him up for this, I could not find any information in epic from his neurologist regarding this.  If he does have MMD he may warrant a cardiac workup if he continues to have symptoms to be sure this is not related to a cardiomyopathy.

## 2011-08-02 NOTE — Progress Notes (Signed)
  Subjective:    Patient ID: Malik [name], male    DOB: October 03, 1966, 45 y.o.   MRN: [id number]  HPI 1.  Weakness/Dizziness:  Was here today for nurse visit for B12 injection. Recently diagnosed with B12 deficiency by his neurologist Dr. Terrace Arabia.  During nurse visit today he complained of increasing dizziness and weakness over the past few days.  Has feels like he has less energy since starting the B12 shots.  Describes dizziness as light headed feeling when going from a lying to sitting position.  Denies nausea with this.  Review of his chart shows that he has MMD but he says that they are not sure that is what he has.  He has had multiple tests that he says he has not heard the results of.  He has not had a muscle biopsy.   Review of Systems     Objective:   Physical Exam  Constitutional: He is oriented to person, place, and time. No distress.       Cachectic, with diffuse atrophy.   Temporal wasting present.  Eyes: Conjunctivae and EOM are normal. Pupils are equal, round, and reactive to light.       No conjunctival pallor   Neck: Normal range of motion. Neck supple. No thyromegaly present.  Cardiovascular: Normal rate, regular rhythm and normal heart sounds.   Pulmonary/Chest: Effort normal and breath sounds normal. No respiratory distress.  Abdominal: Soft. Bowel sounds are normal.  Neurological: He is alert and oriented to person, place, and time. He displays atrophy. No cranial nerve deficit.  Skin: No pallor.  Psychiatric: He has a normal mood and affect. His behavior is normal. Judgment and thought content normal.          Assessment & Plan:

## 2011-08-05 ENCOUNTER — Ambulatory Visit (INDEPENDENT_AMBULATORY_CARE_PROVIDER_SITE_OTHER): Payer: Medicaid Other | Admitting: Family Medicine

## 2011-08-05 ENCOUNTER — Encounter: Payer: Self-pay | Admitting: Family Medicine

## 2011-08-05 VITALS — BP 135/85 | HR 68 | Temp 97.5°F | Ht 68.0 in | Wt 119.8 lb

## 2011-08-05 DIAGNOSIS — E559 Vitamin D deficiency, unspecified: Secondary | ICD-10-CM

## 2011-08-05 DIAGNOSIS — G7111 Myotonic muscular dystrophy: Secondary | ICD-10-CM

## 2011-08-05 DIAGNOSIS — M6281 Muscle weakness (generalized): Secondary | ICD-10-CM

## 2011-08-05 DIAGNOSIS — E538 Deficiency of other specified B group vitamins: Secondary | ICD-10-CM

## 2011-08-05 MED ORDER — ERGOCALCIFEROL 1.25 MG (50000 UT) PO CAPS: 50000.0000 [IU] | ORAL_CAPSULE | ORAL | Status: DC

## 2011-08-05 MED ORDER — CYANOCOBALAMIN 1000 MCG/ML IJ SOLN
1000.0000 ug | Freq: Once | INTRAMUSCULAR | Status: AC
  Administered 2011-08-05: 1000 ug via INTRAMUSCULAR

## 2011-08-05 NOTE — Progress Notes (Signed)
  Subjective:    Patient ID: Malik [name], male    DOB: 1965-11-26, 45 y.o.   MRN: [id number]  HPI  Patient presents for follow up.  He has been to see Neurology and he says that they did not do anything but "take his blood and his money."  He says they have not done any muscle biopsy or EMG testing, and have not discussed a diagnosis with him.   Malik [name] says he never heard from Physical therapy.   Pt says he is no longer feeling dizzy.  He is tolerating is B12 shots well.  He is taking an over the counter Vitamin D and Calcium supplement.    Review of Systems No chest pain, dizziness, no tarry stools, no further light-headedness.     Objective:   Physical Exam  Vitals reviewed. Constitutional: He appears cachectic.  Eyes: Conjunctivae and EOM are normal. Pupils are equal, round, and reactive to light.  Cardiovascular: Normal rate and regular rhythm.   Pulmonary/Chest: Effort normal and breath sounds normal.  Abdominal: Soft. There is no tenderness.  Musculoskeletal: Normal range of motion.  Neurological: He is alert. He displays atrophy. No cranial nerve deficit.       Pt with 3/5 strength in upper extremities, 4/5 in lower.  Gait improved with left ankle brace.           Assessment & Plan:

## 2011-08-05 NOTE — Assessment & Plan Note (Signed)
Will re-refer to physical therapy.  Brace seems to be helping with gain.

## 2011-08-05 NOTE — Assessment & Plan Note (Signed)
Received diagnosis from Neuro, but unclear how this diagnosis was made.  Will obtain records to better understand and help patient understand.

## 2011-08-05 NOTE — Assessment & Plan Note (Signed)
Continue B12 injections.   

## 2011-08-05 NOTE — Assessment & Plan Note (Signed)
Will Rx replacement dose Vit D.

## 2011-08-13 ENCOUNTER — Ambulatory Visit (INDEPENDENT_AMBULATORY_CARE_PROVIDER_SITE_OTHER): Payer: Medicaid Other | Admitting: *Deleted

## 2011-08-13 DIAGNOSIS — E538 Deficiency of other specified B group vitamins: Secondary | ICD-10-CM

## 2011-08-13 MED ORDER — CYANOCOBALAMIN 1000 MCG/ML IJ SOLN
1000.0000 ug | Freq: Once | INTRAMUSCULAR | Status: AC
  Administered 2011-08-13: 1000 ug via INTRAMUSCULAR

## 2011-08-17 ENCOUNTER — Ambulatory Visit: Payer: Medicaid Other | Attending: Physical Therapy | Admitting: Physical Therapy

## 2011-08-20 ENCOUNTER — Ambulatory Visit (INDEPENDENT_AMBULATORY_CARE_PROVIDER_SITE_OTHER): Payer: Medicaid Other | Admitting: *Deleted

## 2011-08-20 DIAGNOSIS — E538 Deficiency of other specified B group vitamins: Secondary | ICD-10-CM

## 2011-08-20 MED ORDER — CYANOCOBALAMIN 1000 MCG/ML IJ SOLN
1000.0000 ug | Freq: Once | INTRAMUSCULAR | Status: AC
  Administered 2011-08-20: 1000 ug via INTRAMUSCULAR

## 2011-09-01 ENCOUNTER — Encounter: Payer: Self-pay | Admitting: Family Medicine

## 2011-09-01 ENCOUNTER — Ambulatory Visit (INDEPENDENT_AMBULATORY_CARE_PROVIDER_SITE_OTHER): Payer: Medicaid Other | Admitting: Family Medicine

## 2011-09-01 DIAGNOSIS — R634 Abnormal weight loss: Secondary | ICD-10-CM

## 2011-09-01 DIAGNOSIS — E559 Vitamin D deficiency, unspecified: Secondary | ICD-10-CM

## 2011-09-01 DIAGNOSIS — G7111 Myotonic muscular dystrophy: Secondary | ICD-10-CM

## 2011-09-01 DIAGNOSIS — E538 Deficiency of other specified B group vitamins: Secondary | ICD-10-CM

## 2011-09-01 MED ORDER — CALCIUM CITRATE 950 (200 CA) MG PO TABS: 1.0000 | ORAL_TABLET | Freq: Every day | ORAL | Status: DC

## 2011-09-01 MED ORDER — ERGOCALCIFEROL 1.25 MG (50000 UT) PO CAPS: 50000.0000 [IU] | ORAL_CAPSULE | ORAL | Status: DC

## 2011-09-01 NOTE — Assessment & Plan Note (Signed)
Continue B12 injections monthly

## 2011-09-01 NOTE — Assessment & Plan Note (Signed)
Continue replacement dose of D3.  Will re-check level next visit and plan to switch to maintenance dose.

## 2011-09-01 NOTE — Patient Instructions (Signed)
It was good to see you.  I would like to refer you to a clinic that is specific for your disorder. Please call them on Friday and ask to speak to Georgiann Hahn.  Please try to see the Physical Therapist to work on your strength.  Also, you need to get your dentures made so you can eat enough calories.  Also, there is a website for Muscular Dystrophy: http://robles.biz/ that can help you educate yourself and give information about resources.    Here's the Clinic's information: [facility] [address] [place], [place] [address]  Darden Amber   Phone:  [phone]  Office Email:  [email or web address]

## 2011-09-01 NOTE — Assessment & Plan Note (Signed)
This is most likely due to Myotonic Muscular dystrophy.  However, patient has had blood in his stool from time to time, will refer for Colonoscopy to rule out confounding illness.

## 2011-09-01 NOTE — Progress Notes (Signed)
  Subjective:    Patient ID: Malik [name], male    DOB: 1966-05-07, 45 y.o.   MRN: [id number]  HPI  Patient comes in for followup of his myotonic muscular dystrophy. He says that he has not made it to physical therapy, because there was confusion about the address and his transportation, so he could not go. He says he has not been back to see his neurologist. Also, she has not gotten his dentures maybe yet. He reports no real change in his physical condition.  Patient does have questions about his diagnosis, and prognosis. He is agreeable to going to the muscular dystrophy clinic in [place]. He denies any further symptoms of dizziness, worsened fatigue.    He says that his ankle brace helps a lot.  However, he complains that it is hard to hold his head up when he walks, and that he gets tired easily.    Review of Systems Per HPI    Objective:   Physical Exam BP 157/93  Pulse 76  Temp(Src) 98.1 F (36.7 C) (Oral)  Ht 5\' 8"  (1.727 m)  Wt 120 lb (54.432 kg)  BMI 18.25 kg/m2 General appearance: alert, cooperative and cachectic Head: Normocephalic, without obvious abnormality, atraumatic Eyes: EOMIT Extremities: atrophy of musculature of all 4 limbs.  Neurologic: Mental status: Alert, oriented, thought content appropriate Cranial nerves: normal Sensory: normal Motor: grade 3 In all 4 extreimities.  Gait: Abnormal, pt walks with cane.  Unable to go from sitting to standing without assistance of arms.        Assessment & Plan:

## 2011-09-01 NOTE — Assessment & Plan Note (Signed)
Discussed diagnosis with patient.  Gave information from mda.org about this specific illness.  I am recommending he see the [facility] clinic in [place], and will make the referral.  Patient is to call the clinic to schedule an appointment.

## 2011-09-08 ENCOUNTER — Ambulatory Visit
Admission: RE | Admit: 2011-09-08 | Discharge: 2011-09-08 | Disposition: A | Discharge status: Home / Self Care | Payer: Medicaid Other | Source: Ambulatory Visit | Attending: Family Medicine | Admitting: Family Medicine

## 2011-09-08 DIAGNOSIS — R634 Abnormal weight loss: Secondary | ICD-10-CM

## 2011-09-08 DIAGNOSIS — E538 Deficiency of other specified B group vitamins: Secondary | ICD-10-CM

## 2011-09-14 ENCOUNTER — Ambulatory Visit: Payer: Medicaid Other | Attending: Family Medicine | Admitting: Physical Therapy

## 2011-09-14 DIAGNOSIS — M6281 Muscle weakness (generalized): Secondary | ICD-10-CM | POA: Insufficient documentation

## 2011-09-14 DIAGNOSIS — IMO0001 Reserved for inherently not codable concepts without codable children: Secondary | ICD-10-CM | POA: Insufficient documentation

## 2011-09-14 DIAGNOSIS — G7111 Myotonic muscular dystrophy: Secondary | ICD-10-CM | POA: Insufficient documentation

## 2011-09-20 ENCOUNTER — Ambulatory Visit (INDEPENDENT_AMBULATORY_CARE_PROVIDER_SITE_OTHER): Payer: Medicaid Other | Admitting: *Deleted

## 2011-09-20 DIAGNOSIS — E538 Deficiency of other specified B group vitamins: Secondary | ICD-10-CM

## 2011-09-20 MED ORDER — CYANOCOBALAMIN 1000 MCG/ML IJ SOLN
1000.0000 ug | Freq: Once | INTRAMUSCULAR | Status: AC
  Administered 2011-09-20: 1000 ug via INTRAMUSCULAR

## 2011-09-30 ENCOUNTER — Ambulatory Visit: Payer: Medicaid Other | Admitting: Physical Therapy

## 2011-10-14 ENCOUNTER — Ambulatory Visit: Payer: Medicaid Other | Admitting: Physical Therapy

## 2011-10-18 ENCOUNTER — Ambulatory Visit (INDEPENDENT_AMBULATORY_CARE_PROVIDER_SITE_OTHER): Payer: Medicaid Other | Admitting: *Deleted

## 2011-10-18 DIAGNOSIS — E538 Deficiency of other specified B group vitamins: Secondary | ICD-10-CM

## 2011-10-18 MED ORDER — CYANOCOBALAMIN 1000 MCG/ML IJ SOLN
1000.0000 ug | Freq: Once | INTRAMUSCULAR | Status: AC
  Administered 2011-10-18: 1000 ug via INTRAMUSCULAR

## 2011-10-27 ENCOUNTER — Ambulatory Visit (INDEPENDENT_AMBULATORY_CARE_PROVIDER_SITE_OTHER): Payer: Medicaid Other | Admitting: Family Medicine

## 2011-10-27 ENCOUNTER — Encounter: Payer: Self-pay | Admitting: Family Medicine

## 2011-10-27 DIAGNOSIS — E559 Vitamin D deficiency, unspecified: Secondary | ICD-10-CM

## 2011-10-27 DIAGNOSIS — E538 Deficiency of other specified B group vitamins: Secondary | ICD-10-CM

## 2011-10-27 DIAGNOSIS — G7111 Myotonic muscular dystrophy: Secondary | ICD-10-CM

## 2011-10-27 NOTE — Assessment & Plan Note (Signed)
Will check level in one month and anticipate switch from replacement to maintenance dose.

## 2011-10-27 NOTE — Patient Instructions (Signed)
It was nice to see you.  Please come back for lab work to have your Vitamin D checked in about 1 month.  Please make an appointment to see me in about 2 months.  Please ask the Neurologist to send me a note when you see them.

## 2011-10-27 NOTE — Assessment & Plan Note (Signed)
Pt's Lower extremity function improved with PT, he will have several more sessions.  Will make referral for OT as pt having difficulty with some ADL's and upper extremity strength.   Discussed MMD and risks of general anesthesia.  He and his Aunt do not know what kind of Anesthesia dentist is planning to use.  I wrote note saying pt has had cardiac eval, ECHO normal, no known involvement of heart or respiratory muscles.  Also, pt had conscious sedation recently for colonoscopy, and no difficulty.  Attached hand out from MD website for general principals of anesthesia in patients with MD.   Pt to see [facility] neuro next month, asked them to send records.

## 2011-10-27 NOTE — Progress Notes (Signed)
  Subjective:    Patient ID: Malik [name], male    DOB: 09/26/1966, 45 y.o.   MRN: [id number]  HPI  [name] comes in for follow up of his Myotonic Muscular Dystrophy.  He has not yet seen the [facility] clinic that specializes in muscular dystrophy, but is supposed to see them in January.  He has been going to Physical therapy, which has been helping him with his strength, and his walking.  However, he is still having difficulty with things like feeding himself and shaving.  He and his Aunt would like to know if I think Occupational therapy would help him.   He is now taking oral Vitamin B 12, no side effects from the pills.   He is completing his second month of Vitamin D replacement, taking the once weekly pills without difficulty.   Pt and Aunt curious about recent test results (Bone Density and Colonoscopy).  Bone density was normal.  Pt had a polyp removed during colonoscopy, tubular adenoma, no malignancy, will need to follow up in 5 years.   The patient has not yet gotten his dentures, he needs some sort of dental procedure to remove a bone that is poking through his gums (although he says it is not bothering him) to get dentures.  His dentist has requested a clearance note.   Review of Systems Pertinent Items noted in HPI.     Objective:   Physical Exam BP 143/87  Pulse 66  Temp(Src) 97.8 F (36.6 C) (Oral)  Ht 5\' 8"  (1.727 m)  Wt 117 lb (53.071 kg)  BMI 17.79 kg/m2 General appearance: alert, cooperative and cachectic Head: Normocephalic, without obvious abnormality, atraumatic Eyes: PERRL, EOMIT Throat: Oral mucosa moist, no lesions.  Teeth s/p removal, no obvious bony abnormality.  Lungs: clear to auscultation bilaterally Heart: regular rate and rhythm, S1, S2 normal, no murmur, click, rub or gallop Extremities: atrophy in all 5 extremities.  Pulses: 2+ and symmetric Skin: Skin color, texture, turgor normal. No rashes or lesions Neuro: Strength 3/5 in flexor and  extensor muscles in upper extremities.  Strength 4/5 in lower extremities in flexor and extensor muscle groups.        Assessment & Plan:

## 2011-10-27 NOTE — Assessment & Plan Note (Signed)
S/P injections, now taking oral replacement, continue.

## 2011-10-28 ENCOUNTER — Ambulatory Visit: Payer: Medicaid Other | Admitting: Physical Therapy

## 2011-11-18 ENCOUNTER — Ambulatory Visit (INDEPENDENT_AMBULATORY_CARE_PROVIDER_SITE_OTHER): Payer: Medicaid Other | Admitting: *Deleted

## 2011-11-18 ENCOUNTER — Other Ambulatory Visit: Payer: Self-pay | Admitting: Family Medicine

## 2011-11-18 ENCOUNTER — Other Ambulatory Visit: Payer: Medicaid Other

## 2011-11-18 DIAGNOSIS — E538 Deficiency of other specified B group vitamins: Secondary | ICD-10-CM

## 2011-11-18 NOTE — Progress Notes (Signed)
Patient in for B12 injection. However after reviewing Dr. Melina Modena last office note  Unsure whether patient needs to continue. Consulted with her and she advises that if patient is taking B12 by mouth he does not need the injection. Patient states he has been taking OTC B12 for 2.5 months.   Will send message to Dr. Lula Olszewski to put in D/C order.

## 2011-11-18 NOTE — Progress Notes (Signed)
Vit d done today [name] 

## 2011-11-19 LAB — VITAMIN D 25 HYDROXY (VIT D DEFICIENCY, FRACTURES): Vit D, 25-Hydroxy: 24 ng/mL — ABNORMAL LOW (ref 30–89)

## 2011-11-21 ENCOUNTER — Encounter: Payer: Self-pay | Admitting: Family Medicine

## 2011-11-23 ENCOUNTER — Encounter: Payer: Self-pay | Admitting: Family Medicine

## 2011-11-23 ENCOUNTER — Ambulatory Visit (INDEPENDENT_AMBULATORY_CARE_PROVIDER_SITE_OTHER): Payer: Medicaid Other | Admitting: Family Medicine

## 2011-11-23 DIAGNOSIS — E538 Deficiency of other specified B group vitamins: Secondary | ICD-10-CM

## 2011-11-23 DIAGNOSIS — E559 Vitamin D deficiency, unspecified: Secondary | ICD-10-CM

## 2011-11-23 MED ORDER — ERGOCALCIFEROL 1.25 MG (50000 UT) PO CAPS: 50000.0000 [IU] | ORAL_CAPSULE | ORAL | Status: DC

## 2011-11-23 NOTE — Patient Instructions (Addendum)
Thank you for coming in today. Please schedule with Dr. Lula Olszewski in 2 months.  You can follow up your visit with [facility] with her.  Get your labs about 1 week before you see Dr. Lula Olszewski. We will check B12 and Vit D.

## 2011-11-23 NOTE — Progress Notes (Signed)
Malik [name] is a 46 year old male presents to clinic today to followup his low vitamin D.  He had labs performed last week.  His vitamin D level was 25. He currently is taking 50,000 units of vitamin D weekly.  He feels about the same.  PMH reviewed.  ROS as above otherwise neg Medications reviewed. Current Outpatient Prescriptions  Medication Sig Dispense Refill  . calcium citrate (CALCITRATE - DOSED IN MG ELEMENTAL CALCIUM) 950 MG tablet Take 1 tablet (200 mg of elemental calcium total) by mouth daily.  30 tablet  11  . ergocalciferol (VITAMIN D2) 50000 UNITS capsule Take 1 capsule (50,000 Units total) by mouth once a week.  4 capsule  2    Exam:  BP 140/90  Pulse 78  Ht 5\' 8"  (1.727 m)  Wt 118 lb 12.8 oz (53.887 kg)  BMI 18.06 kg/m2 Gen: Well NAD, thin Gait: Walks well with a cane

## 2011-11-23 NOTE — Assessment & Plan Note (Signed)
Vitamin D deficiency continues. However it is a bit improved from the last measurement. Plan to continue weekly vitamin D 50,000 units.  Plan to recheck a level in 2 months and followup with primary care provider at that point.

## 2011-11-23 NOTE — Assessment & Plan Note (Signed)
Plan to check vitamin B12 at the next visit

## 2012-01-20 ENCOUNTER — Ambulatory Visit (INDEPENDENT_AMBULATORY_CARE_PROVIDER_SITE_OTHER): Payer: Medicaid Other | Admitting: Family Medicine

## 2012-01-20 VITALS — BP 150/83 | HR 60 | Temp 98.5°F | Ht 68.0 in | Wt 117.0 lb

## 2012-01-20 DIAGNOSIS — G7111 Myotonic muscular dystrophy: Secondary | ICD-10-CM

## 2012-01-20 DIAGNOSIS — I10 Essential (primary) hypertension: Secondary | ICD-10-CM

## 2012-01-20 DIAGNOSIS — E559 Vitamin D deficiency, unspecified: Secondary | ICD-10-CM

## 2012-01-20 MED ORDER — HYDROCHLOROTHIAZIDE 25 MG PO TABS: 25.0000 mg | ORAL_TABLET | Freq: Every day | ORAL | Status: DC

## 2012-01-20 NOTE — Assessment & Plan Note (Addendum)
Unfortunately I do not have any records about his visits with [facility]. Will try to obtain records, they signed release of info form today.

## 2012-01-20 NOTE — Progress Notes (Signed)
  Subjective:    Patient ID: Malik [name], male    DOB: 1966-04-14, 46 y.o.   MRN: [id number]  HPI  Malik [name] comes in for follow up.  He and his aunt are here today, and they are under the impression that I have records from [facility].  He has been to see them twice, and was told they were 99% sure of his diagnosis, but wanted to be 100% sure.   MDD- He is doing OK with his weaknss- wearing left ankle brace, which helps tremendously.  He is also walking with a 4 point cane.  He felt that PT helped. He has not had any falls, and is getting around [facility].   He is currently living with his aunt, but is interested in talking with our social worker about getting his own housing and meeting his needs as far as current and possible future disability.   HTN- He has had several elevated blood pressure readings- he denies ever having chest pain, dyspnea, palpitations, or LE swelling.  He does not like taking pills but is agreeable to taking a BP med if it dose not make him feel bad.   Vitamin deficiencies- taking D and B12 PO as prescribed.   Review of Systems Pertinent items in HPI.     Objective:   Physical Exam BP 150/83  Pulse 60  Temp(Src) 98.5 F (36.9 C) (Oral)  Ht 5\' 8"  (1.727 m)  Wt 117 lb (53.071 kg)  BMI 17.79 kg/m2 General appearance: alert, cooperative and no distress, cachectic.   Head: temporal wasting.  Neck: No thyromegaly, adenopathy, or JVD Eyes: conjunctivae/corneas clear. PERRL, EOM's intact. Fundi benign. Lungs: clear to auscultation bilaterally Heart: regular rate and rhythm, S1, S2 normal, no murmur, click, rub or gallop Extremities: thin with atropy.  Pulses: 2+ and symmetric Neuro: upper extremities with 3/5 strength and symmetric, lower with 4/5 and symmetric.       Assessment & Plan:

## 2012-01-20 NOTE — Assessment & Plan Note (Signed)
Patient is taking D replacement dose, will plan to re-check in 2 months, and hopefully at that time change to maintenance dosing.

## 2012-01-20 NOTE — Assessment & Plan Note (Signed)
Patient with multiple elevated pressures over past 6 months.  Discussed risks of uncontrolled HTN, advised starting HCTZ, patient agrees.

## 2012-01-20 NOTE — Patient Instructions (Signed)
Please make a lab appointment for around the middle of May to have your Vitamin D level checked again.  When you get that blood work done, please make an appointment to see me a week or two later.   We will try to get records from [facility], and I will call you when I see those results.

## 2012-02-04 ENCOUNTER — Emergency Department (HOSPITAL_COMMUNITY)
Admission: EM | Admit: 2012-02-04 | Discharge: 2012-02-04 | Disposition: A | Discharge status: Home / Self Care | Payer: Medicaid Other | Attending: Emergency Medicine | Admitting: Emergency Medicine

## 2012-02-04 ENCOUNTER — Encounter (HOSPITAL_COMMUNITY): Payer: Self-pay | Admitting: *Deleted

## 2012-02-04 ENCOUNTER — Emergency Department (HOSPITAL_COMMUNITY): Payer: Medicaid Other

## 2012-02-04 DIAGNOSIS — M79609 Pain in unspecified limb: Secondary | ICD-10-CM | POA: Insufficient documentation

## 2012-02-04 DIAGNOSIS — W19XXXA Unspecified fall, initial encounter: Secondary | ICD-10-CM | POA: Insufficient documentation

## 2012-02-04 DIAGNOSIS — G7100 Muscular dystrophy, unspecified: Secondary | ICD-10-CM

## 2012-02-04 DIAGNOSIS — G7111 Myotonic muscular dystrophy: Secondary | ICD-10-CM | POA: Insufficient documentation

## 2012-02-04 DIAGNOSIS — T07XXXA Unspecified multiple injuries, initial encounter: Secondary | ICD-10-CM | POA: Insufficient documentation

## 2012-02-04 DIAGNOSIS — M25469 Effusion, unspecified knee: Secondary | ICD-10-CM | POA: Insufficient documentation

## 2012-02-04 DIAGNOSIS — M25461 Effusion, right knee: Secondary | ICD-10-CM

## 2012-02-04 HISTORY — DX: Essential (primary) hypertension: I10

## 2012-02-04 MED ORDER — MORPHINE SULFATE 4 MG/ML IJ SOLN
4.0000 mg | Freq: Once | INTRAMUSCULAR | Status: AC
  Administered 2012-02-04: 4 mg via INTRAVENOUS
  Filled 2012-02-04: qty 1

## 2012-02-04 MED ORDER — HYDROCODONE-ACETAMINOPHEN 5-325 MG PO TABS: 1.0000 | ORAL_TABLET | Freq: Four times a day (QID) | ORAL | Status: AC | PRN

## 2012-02-04 MED ORDER — HYDROCODONE-ACETAMINOPHEN 5-325 MG PO TABS
1.0000 | ORAL_TABLET | Freq: Once | ORAL | Status: AC
  Administered 2012-02-04: 1 via ORAL
  Filled 2012-02-04: qty 1

## 2012-02-04 NOTE — Progress Notes (Signed)
Faxed copy of Rx for standard walker to [facility] at [phone] with return confirmation at 1530

## 2012-02-04 NOTE — Progress Notes (Signed)
Ed Cm contacted by ED RN to assist with a standard walker for pt Spoke with pt and sister at bedside. Answered questions about levels of therapy care and DME.  [facility]ose [facility] for DME but [facility] only carries standard walkers.  Referral completed to [facility] via fax Walker to be delivered to hospital prior to d/c EDP, Lynelle Doctor, wants pt to discuss home health therapies with pcp

## 2012-02-04 NOTE — ED Provider Notes (Signed)
History     CSN: [id number]  Arrival date & time 02/04/12  1112   First MD Initiated Contact with Patient 02/04/12 1114      Chief Complaint  Patient presents with  . Leg Pain    right    HPI Pt started having pain in his right leg today.  Pt has history of muscular dystrophy.  He has to use a cane to walk but he cannot even do that without pain today.  The pain starts in the foot and shoots up his leg.  He did fall yesterday.  He is not sure if he injured himself.  NO trouble breathing.  No numbness.  No vomiting. Past Medical History  Diagnosis Date  . Myotonic muscular dystrophy   . Hypertension     Past Surgical History  Procedure Date  . Multiple tooth extractions 2007    Pt had all teeth pulled, has not yet gotten dentures.   . Cholecystectomy     Family History  Problem Relation Age of Onset  . COPD Mother   . Diabetes Father     History  Substance Use Topics  . Smoking status: Current Some Day Smoker -- 1.0 packs/day    Types: Cigarettes  . Smokeless tobacco: Never Used   Comment: Pt states that he has not bought any since mid september, but will smoke if he finds one  . Alcohol Use: No      Review of Systems  Constitutional: Negative for fever.  Respiratory: Negative for shortness of breath.   Cardiovascular: Negative for chest pain.  Neurological: Negative for syncope and headaches.  All other systems reviewed and are negative.    Allergies  Review of patient's allergies indicates no known allergies.  Home Medications   Current Outpatient Rx  Name Route Sig Dispense Refill  . CALCIUM CITRATE 950 MG PO TABS Oral Take 1 tablet (200 mg of elemental calcium total) by mouth daily. 30 tablet 11  . ERGOCALCIFEROL 50000 UNITS PO CAPS Oral Take 1 capsule (50,000 Units total) by mouth once a week. 4 capsule 2  . HYDROCHLOROTHIAZIDE 25 MG PO TABS Oral Take 1 tablet (25 mg total) by mouth daily. 30 tablet 5  . VITAMIN B-12 1000 MCG PO TABS Oral Take 1,000  mcg by mouth daily.      BP 127/81  Pulse 71  Temp(Src) 97.8 F (36.6 C) (Oral)  Resp 20  SpO2 100%  Physical Exam  Nursing note and vitals reviewed. Constitutional: No distress.  HENT:  Head: Normocephalic and atraumatic.  Right Ear: External ear normal.  Left Ear: External ear normal.  Eyes: Conjunctivae are normal. Right eye exhibits no discharge. Left eye exhibits no discharge. No scleral icterus.  Neck: Neck supple. No tracheal deviation present.  Cardiovascular: Normal rate, regular rhythm and intact distal pulses.   Pulmonary/Chest: Effort normal and breath sounds normal. No stridor. No respiratory distress. He has no wheezes. He has no rales.  Abdominal: Soft. Bowel sounds are normal. He exhibits no distension. There is no tenderness. There is no rebound and no guarding.  Musculoskeletal: He exhibits no edema and no tenderness.       Left shoulder: He exhibits tenderness and bony tenderness. He exhibits no swelling and no deformity.       Right knee: He exhibits effusion. tenderness found.       Right ankle: He exhibits decreased range of motion. He exhibits no deformity. tenderness. No lateral malleolus and no medial malleolus tenderness found.  Right foot: He exhibits tenderness. He exhibits no swelling and no deformity.  Neurological: He is alert. He displays atrophy. No sensory deficit. Cranial nerve deficit:  no gross defecits noted. He exhibits normal muscle tone. He displays no seizure activity. Coordination normal.       General weakness in extremities,   Skin: Skin is warm and dry. No rash noted.  Psychiatric: He has a normal mood and affect.    ED Course  Procedures (including critical care time)  Labs Reviewed - No data to display Dg Ankle Complete Right  02/04/2012  *RADIOLOGY REPORT*  Clinical Data:  Right knee pain.  Unable to bear weight.  RIGHT ANKLE - COMPLETE 3+ VIEW  Comparison:  None.  Findings:  There is no evidence of fracture, dislocation, or  joint effusion.  There is no evidence of arthropathy or other focal bone abnormality.  Soft tissues are unremarkable.  IMPRESSION: Negative.  Original Report Authenticated By: [name], M.D.   Dg Shoulder Left  02/04/2012  *RADIOLOGY REPORT*  Clinical Data: Pain  LEFT SHOULDER - 2+ VIEW  Comparison:  None.  Findings:  There is no evidence of fracture or dislocation.  There is no evidence of arthropathy or other focal bone abnormality. Soft tissues are unremarkable.  IMPRESSION: Negative.  Original Report Authenticated By: [name], M.D.   Dg Knee Complete 4 Views Right  02/04/2012  *RADIOLOGY REPORT*  Clinical Data: Right knee swelling.  RIGHT KNEE - COMPLETE 4+ VIEW  Comparison: None.  Findings: There is no visible fracture or dislocation.  An effusion is present.  IMPRESSION: Positive for effusion.  No visible osseous injury.  Original Report Authenticated By: [name], M.D.   Dg Foot Complete Right  02/04/2012  *RADIOLOGY REPORT*  Clinical Data: Swelling with right knee pain.  Unable to bear weight.  RIGHT FOOT COMPLETE - 3+ VIEW  Comparison:  None.  Findings:  There is no evidence of fracture or dislocation.  There is no evidence of arthropathy or other focal bone abnormality. Soft tissues are unremarkable.  IMPRESSION: Negative.  Original Report Authenticated By: [name], M.D.     1. Multiple contusions   2. Knee effusion, right   3. Muscular dystrophy       MDM  Patient does not have evidence of a fracture. He does have an effusion of the right knee. At this time I do not feel there is any need for acute orthopedic intervention.  He'll be discharged home with a walker and a prescription for oral pain medications.      Celene Kras, MD 02/04/12 [phone]

## 2012-02-04 NOTE — ED Notes (Signed)
EMS called to home.  Found patient sitting in chair.  Complaining of right leg pain with Left shoulder pain Patient denies any LOC.  Patient has edema in the right lower extremity.  Pain rated 8 of 10 currently

## 2012-02-04 NOTE — Progress Notes (Signed)
CM received call from [facility] [place] to assist with standard walker. [facility] unable to delivery to hospital.  Spoke with Dr Lynelle Doctor and sister Sister agrees to pick up standard walker from [place] on [address] and copy of directions provided to sister

## 2012-02-04 NOTE — Progress Notes (Signed)
Ht 5'8" wt 177 lbs

## 2012-03-22 ENCOUNTER — Other Ambulatory Visit: Payer: Self-pay | Admitting: Family Medicine

## 2012-03-22 ENCOUNTER — Encounter: Payer: Self-pay | Admitting: Family Medicine

## 2012-03-22 ENCOUNTER — Ambulatory Visit (INDEPENDENT_AMBULATORY_CARE_PROVIDER_SITE_OTHER): Payer: Medicaid Other | Admitting: Family Medicine

## 2012-03-22 VITALS — BP 133/85 | HR 74 | Ht 68.0 in | Wt 112.0 lb

## 2012-03-22 DIAGNOSIS — I10 Essential (primary) hypertension: Secondary | ICD-10-CM

## 2012-03-22 DIAGNOSIS — G7111 Myotonic muscular dystrophy: Secondary | ICD-10-CM

## 2012-03-22 DIAGNOSIS — E559 Vitamin D deficiency, unspecified: Secondary | ICD-10-CM

## 2012-03-22 DIAGNOSIS — R634 Abnormal weight loss: Secondary | ICD-10-CM

## 2012-03-22 LAB — COMPREHENSIVE METABOLIC PANEL
ALT: 16 U/L (ref 0–53)
AST: 18 U/L (ref 0–37)
Albumin: 4.5 g/dL (ref 3.5–5.2)
Alkaline Phosphatase: 88 U/L (ref 39–117)
BUN: 14 mg/dL (ref 6–23)
CO2: 26 mEq/L (ref 19–32)
Calcium: 9.6 mg/dL (ref 8.4–10.5)
Chloride: 109 mEq/L (ref 96–112)
Creat: 0.49 mg/dL — ABNORMAL LOW (ref 0.50–1.35)
Glucose, Bld: 88 mg/dL (ref 70–99)
Potassium: 3.8 mEq/L (ref 3.5–5.3)
Sodium: 146 mEq/L — ABNORMAL HIGH (ref 135–145)
Total Bilirubin: 0.4 mg/dL (ref 0.3–1.2)
Total Protein: 6.3 g/dL (ref 6.0–8.3)

## 2012-03-22 MED ORDER — HYDROCHLOROTHIAZIDE 12.5 MG PO TABS: 12.5000 mg | ORAL_TABLET | Freq: Every day | ORAL | Status: DC

## 2012-03-22 NOTE — Assessment & Plan Note (Signed)
Received records from [facility], but nor results of blood work.  The notes did indicate that he is to follow up with them every 3 months or so, but patient did not realize this.  He also says they were supposed to send him a booklet in the mail which he never received.  I advised him to call them to schedule an appointment for follow up and to ask them to send him the book.  

## 2012-03-22 NOTE — Assessment & Plan Note (Deleted)
Received records from [facility], but nor results of blood work.  The notes did indicate that he is to follow up with them every 3 months or so, but patient did not realize this.  He also says they were supposed to send him a booklet in the mail which he never received.  I advised him to call them to schedule an appointment for follow up and to ask them to send him the book.

## 2012-03-22 NOTE — Assessment & Plan Note (Signed)
Will decrease HCTZ from 25 po daily to 12.5 po daily and see if BP controlled without side effects.  Follow up in 3 months.

## 2012-03-22 NOTE — Assessment & Plan Note (Signed)
Patient has lost more weight, which is probably due to MDD, but will check CMET to ensure nutrition is adequate.

## 2012-03-22 NOTE — Assessment & Plan Note (Signed)
Will check Vit D level today, anticipate changing to daily low dose Vit D maintenance.

## 2012-03-22 NOTE — Progress Notes (Signed)
  Subjective:    Patient ID: Malik [name], male    DOB: [birth year], 46 y.o.   MRN: [id number]  HPI  [name] comes in for follow up.  He says he has been doing OK lately, but is still wanting to know what the blood work the [facility] did showed.    HTN- He was having some light headedness, so he stopped taking his HCTZ a few days ago.  He denies chest pain or dyspnea.   Weight loss- he has his dentures now and says he is "eating like a horse" and he is surprised he has lost more weight. Denies further hematochezia. Denies any illnesses.   Vit D deficiency- taking replacement vitamin D pills weekly.  Level was checked a few months ago and it was still low, so replacement dose was continued.   Review of Systems Pertinent items in HPI.     Objective:   Physical Exam BP 133/85  Pulse 74  Ht 5\' 8"  (1.727 m)  Wt 112 lb (50.803 kg)  BMI 17.03 kg/m2 General appearance: alert, cooperative, cachectic and no distress Eyes: conjunctivae/corneas clear. PERRL, EOM's intact. Fundi benign. Lungs: clear to auscultation bilaterally Heart: regular rate and rhythm, S1, S2 normal, no murmur, click, rub or gallop Extremities: diffuse atrophy, no edema.  Pulses: 2+ and symmetric       Assessment & Plan:

## 2012-03-22 NOTE — Patient Instructions (Addendum)
It was good to see you.  I have received the records from [facility], and they do want you to follow up with them, so please call them this week to schedule an appointment for when your Malik [name] is back in town and can take you to [facility] to see them.    Since the blood pressure pill was making you dizzy, I am going to decrease the dose.  I have sent a new prescription to the pharmacy for the smaller pill, but you can cut the ones you have in half until you run out of the bottle you have.    I am checking your Vitamin D level to see if it is normal.  If it is, we will change your vitamin D pill from the high dose replacement to a low dose maintenance medication.  I am also checking your electrolytes, kidney function, and protein levels to make sure they are all OK.  I will send you a letter with your lab results when I get them back.

## 2012-03-23 ENCOUNTER — Encounter: Payer: Self-pay | Admitting: Family Medicine

## 2012-03-23 LAB — VITAMIN D 25 HYDROXY (VIT D DEFICIENCY, FRACTURES): Vit D, 25-Hydroxy: 37 ng/mL (ref 30–89)

## 2012-06-16 ENCOUNTER — Ambulatory Visit (INDEPENDENT_AMBULATORY_CARE_PROVIDER_SITE_OTHER): Payer: Medicaid Other | Admitting: Family Medicine

## 2012-06-16 ENCOUNTER — Encounter: Payer: Self-pay | Admitting: Family Medicine

## 2012-06-16 VITALS — BP 129/80 | HR 64 | Temp 97.9°F | Ht 68.0 in | Wt 111.0 lb

## 2012-06-16 DIAGNOSIS — G7111 Myotonic muscular dystrophy: Secondary | ICD-10-CM

## 2012-06-16 DIAGNOSIS — I10 Essential (primary) hypertension: Secondary | ICD-10-CM

## 2012-06-16 DIAGNOSIS — R634 Abnormal weight loss: Secondary | ICD-10-CM

## 2012-06-16 DIAGNOSIS — H5200 Hypermetropia, unspecified eye: Secondary | ICD-10-CM

## 2012-06-16 NOTE — Progress Notes (Signed)
I have seen and examined [name] with Malik [name].  I agree with his history, physical, and assessment and plan.   Briefly, Malik [name] is a 46 year old man with mild HTN and Myotonic Muscular Dystrophy.  He has noted some difficulty reading small print over the past 2-3 months.  He feels better on the reduced dose of HCTZ. He and his Malik [name] are asking for proof of his diagnosis, and want the records of the genetic test that was sent by his initial neurologist visit at [facility].    - Referral ordered for Opthalmology - Release of info faxed again to [facility] - continue HCTZ - F/U with Dr. Alphonzo Dublin at [facility] in September.

## 2012-06-16 NOTE — Patient Instructions (Signed)
It was good to see you.  Your blood pressure today was BP: 129/80 mmHg.  Remember your goal blood pressure is about 130/80.  Please be sure to take your medication every day.    I am going to make a referral for you to see an eye doctor to evaluate the vision changes you are having.  The office will contact you with that appointment.

## 2012-06-16 NOTE — Progress Notes (Signed)
Subjective:     Patient ID: Malik [name], male   DOB: 1966-07-18, 46 y.o.   MRN: [id number]  HPI  Malik [name] is a 46 yo male with myotonic muscular dystrophy who comes in for follow up and is accompanied by his aunt. He says he has been doing well other than a cold 2 weeks ago that lasted 1 week.  During his cold, he stopped taking all medications for fear that they would interact with his cold therapies (pseudophed, cough syrup, ibuprofen).  Pt was educated that his medications will not interact with the cold medicines and he was encouraged to continue to take his medications in the event he gets another cold.  He is currently taking all his medications.   Eyes: Pt reports worsening of close vision (farsightedness) over the pass 2-3 months.  He reports needing reading glasses recently to read small print.  Denies eye pain.  Pt continues to want to know what the blood work the [employer] showed.   HTN- no reported light headedness on HCTZ 12.5 mg.   He denies chest pain or dyspnea.   Weight loss- has dentures but reports not wearing them often.  Patient encouraged to keep wearing his dentures and continue to eat to prevent further weight loss.         Review of Systems See HPI    Objective:   Physical Exam Eyes: EOM intact, pupils reactive bilaterally Lungs: clear to ascultation bilaterally, no wheezes, no rales Heart: RRR, no M/R/G,  Neuro: generalized weakness    Assessment:     46 yo man with hx of myotonic muscular dystrophy here for f/u on HTN    Plan:     HTN: -continue taking HCTz 12.5 mg  Eyes: -referral to ophthalmologist for further evaluation  Weight loss: -encouraged to wear dentures and eat to maintain weight and prevent weight loss  Myotonic Muscular Dystrophy: -pt desires to know results for  [employer].  Pt signed release for to have result sent to clinic -appt scheduled with [facility] specialist for September 26th    -legs are much better with braces

## 2012-10-12 ENCOUNTER — Ambulatory Visit: Payer: Medicaid Other | Admitting: Family Medicine

## 2012-10-13 ENCOUNTER — Encounter: Payer: Self-pay | Admitting: Family Medicine

## 2012-10-13 ENCOUNTER — Ambulatory Visit (INDEPENDENT_AMBULATORY_CARE_PROVIDER_SITE_OTHER): Payer: Medicaid Other | Admitting: Family Medicine

## 2012-10-13 VITALS — BP 158/93 | HR 83 | Temp 98.2°F | Ht 68.0 in | Wt 108.0 lb

## 2012-10-13 DIAGNOSIS — I10 Essential (primary) hypertension: Secondary | ICD-10-CM

## 2012-10-13 DIAGNOSIS — G7111 Myotonic muscular dystrophy: Secondary | ICD-10-CM

## 2012-10-13 DIAGNOSIS — Z23 Encounter for immunization: Secondary | ICD-10-CM

## 2012-10-13 DIAGNOSIS — H5200 Hypermetropia, unspecified eye: Secondary | ICD-10-CM

## 2012-10-13 MED ORDER — HYDROCHLOROTHIAZIDE 12.5 MG PO TABS: 12.5000 mg | ORAL_TABLET | Freq: Every day | ORAL | Status: DC

## 2012-10-13 NOTE — Patient Instructions (Signed)
Please call your case manager or social worker to try to have your medicaid card changed from [facility] to [facility].  When this is done, please call the office and leave a message, I will refer you to the eye doctor.    Your blood pressure today was BP: 158/93 mmHg.  Remember your goal blood pressure is about 130/80.  Please be sure to take your medication every day.    I am referring you to physical therapy, they will contact you to schedule an appointment.

## 2012-10-13 NOTE — Assessment & Plan Note (Signed)
With increasing weakness, will refer back to PT for therapy.

## 2012-10-13 NOTE — Assessment & Plan Note (Signed)
Asked him to have Office name on medicaid card changed, then will refer him to eye doctor.

## 2012-10-13 NOTE — Assessment & Plan Note (Signed)
Poorly controlled today, but pt did not take medication today. Stressed importance of taking it daily, no changes today.

## 2012-10-13 NOTE — Progress Notes (Signed)
  Subjective:    Patient ID: Malik [name], male    DOB: 1966/05/28, 46 y.o.   MRN: [id number]  HPI  Pearce comes in for follow up.  He is doing OK.  He has seen Neurology at [facility] a few months ago, who advised the felt his Myotonic Muscular Dystrophy diagnosis was accurate.    He complains of having increasing difficulty with getting up out of bed, stepping into the shower, going up and down stairs.    HTN: Takes HCTZ daily, but did not take it this morning because he was in a rush.  Denies chest pain, dyspnea, palpitations, LE edema.   Eyes- still having problems with near sightedness.  He has not seen the eye doctor because he needs a referral (has Medicaid), but his medicaid card has a different practice on it.    I have reviewed the patient's medical history in detail and updated the computerized patient record.   Review of Systems Pertinent items in HPI    Objective:   Physical Exam  BP 158/93  Pulse 83  Temp 98.2 F (36.8 C) (Oral)  Ht 5\' 8"  (1.727 m)  Wt 108 lb (48.988 kg)  BMI 16.42 kg/m2 General appearance: alert, cooperative, cachectic and no distress Lungs: clear to auscultation bilaterally Heart: regular rate and rhythm, S1, S2 normal, no murmur, click, rub or gallop Pulses: 2+ and symmetric      Assessment & Plan:

## 2012-10-20 ENCOUNTER — Other Ambulatory Visit: Payer: Self-pay | Admitting: Family Medicine

## 2012-12-20 ENCOUNTER — Emergency Department (HOSPITAL_COMMUNITY)
Admission: EM | Admit: 2012-12-20 | Discharge: 2012-12-20 | Disposition: A | Discharge status: Home / Self Care | Payer: Medicaid Other | Attending: Emergency Medicine | Admitting: Emergency Medicine

## 2012-12-20 ENCOUNTER — Encounter (HOSPITAL_COMMUNITY): Payer: Self-pay | Admitting: Emergency Medicine

## 2012-12-20 ENCOUNTER — Emergency Department (HOSPITAL_COMMUNITY): Payer: Medicaid Other

## 2012-12-20 DIAGNOSIS — R296 Repeated falls: Secondary | ICD-10-CM | POA: Insufficient documentation

## 2012-12-20 DIAGNOSIS — F17200 Nicotine dependence, unspecified, uncomplicated: Secondary | ICD-10-CM | POA: Insufficient documentation

## 2012-12-20 DIAGNOSIS — Y929 Unspecified place or not applicable: Secondary | ICD-10-CM | POA: Insufficient documentation

## 2012-12-20 DIAGNOSIS — S82009A Unspecified fracture of unspecified patella, initial encounter for closed fracture: Secondary | ICD-10-CM | POA: Insufficient documentation

## 2012-12-20 DIAGNOSIS — I10 Essential (primary) hypertension: Secondary | ICD-10-CM | POA: Insufficient documentation

## 2012-12-20 DIAGNOSIS — Z79899 Other long term (current) drug therapy: Secondary | ICD-10-CM | POA: Insufficient documentation

## 2012-12-20 DIAGNOSIS — Z8739 Personal history of other diseases of the musculoskeletal system and connective tissue: Secondary | ICD-10-CM | POA: Insufficient documentation

## 2012-12-20 DIAGNOSIS — S92109A Unspecified fracture of unspecified talus, initial encounter for closed fracture: Secondary | ICD-10-CM | POA: Insufficient documentation

## 2012-12-20 DIAGNOSIS — Y939 Activity, unspecified: Secondary | ICD-10-CM | POA: Insufficient documentation

## 2012-12-20 DIAGNOSIS — S92253A Displaced fracture of navicular [scaphoid] of unspecified foot, initial encounter for closed fracture: Secondary | ICD-10-CM

## 2012-12-20 MED ORDER — OXYCODONE-ACETAMINOPHEN 5-325 MG PO TABS
2.0000 | ORAL_TABLET | Freq: Once | ORAL | Status: AC
  Administered 2012-12-20: 2 via ORAL
  Filled 2012-12-20: qty 2

## 2012-12-20 MED ORDER — OXYCODONE-ACETAMINOPHEN 5-325 MG PO TABS
1.0000 | ORAL_TABLET | Freq: Once | ORAL | Status: AC
  Administered 2012-12-20: 1 via ORAL
  Filled 2012-12-20: qty 1

## 2012-12-20 MED ORDER — HYDROCODONE-ACETAMINOPHEN 5-325 MG PO TABS: 2.0000 | ORAL_TABLET | ORAL | Status: DC | PRN

## 2012-12-20 NOTE — ED Provider Notes (Signed)
Medical screening examination/treatment/procedure(s) were performed by non-physician practitioner and as supervising physician I was immediately available for consultation/collaboration.  Omunique Pederson, MD 12/20/12 1654 

## 2012-12-20 NOTE — ED Notes (Signed)
Pt here via ems s/p fall 2/18/ atfer pt stated he was fine.Presented today with c/o of rt ankle and lt knee pain

## 2012-12-20 NOTE — ED Notes (Signed)
ZOX:[id number]<AV> Expected date:12/20/12<BR> Expected time:10:54 AM<BR> Means of arrival:Ambulance<BR> Comments:<BR> Fall-ankle, knee pain

## 2012-12-20 NOTE — Progress Notes (Signed)
[facility] ED CM consulted by ED SW after received a call from ED PA.  CM spoke with pt and his father at bedside about CM consult for home health. CM reviewed home health disciplines RN, PT/OT, SW aide and DME CM provided [place] home health agencies to assist with choice of agency.  Pt and father's questions answered about primary caregiver status CM discussed that home health covered by medicaid and that the agency will assist with the scheduling of the services in the home Pending orders and pt agency of choice

## 2012-12-20 NOTE — Progress Notes (Signed)
Pt states he lives with his aunt, his dad lives on the opposite side of town and he has a cousin or friend that can also come in to assist or to be primary care giver as needed

## 2012-12-20 NOTE — Progress Notes (Signed)
[facility] ED CM spoke with pt and his father who first choice of agency "unable to take case", second choice request faxing of clinicals and pt and dad chose [facility] CM called in referral to [facility] CM spoke with EDP Kohut for Face to face completion CM spoke with Darren for DME (bedside commode and w/c) to be delivered to the home The father spoke also with Darren.  EDP and PA updated Pt request d/c home via [facility] ED RN updated.  Pt has sheet of home health agencies to contact for questions and concerns  HOME HEALTH AGENCIES SERVING [place]   Agencies that are Medicare-Certified and are affiliated with The [facility] Agency  Telephone Number Address  [facility]   The [facility] has ownership interest in this company; however, you are under no obligation to use this agency. [phone] or  [phone] [address] [place], [place] [address] [email or web address]   Agencies that are Medicare-Certified and are not affiliated with [facility] Agency Telephone Number Address  [facility] [phone] Fax [phone] [address], [address] [place], [place]  [address] [email or web address]  [facility] [phone] or [phone] Fax [phone] [address] [place], [place] [address] [email or web address]  [facility] [phone] Fax [phone] [address] [place], [place] [address] [email or web address]  [facility] [phone] Fax [phone] [address] [place], [place]  [address] [email or web address]  [facility] [phone] Fax [phone] [address] [place], [place] [address] [email or web address]  [facility] [phone] [address] [place], [place] [address] [email or web address].dk  [facility] [phone]  [address] [place], [place] [address] [email or web address]  [facility] [phone] or [phone] Fax number [phone] [address] [place], [place]  [address] [email or web address]  [facility] [phone] Fax [phone] [address] [place], [place]  [address]  [facility]  [phone] Fax [phone] [address] [place], [place] [address] [email or web address]

## 2012-12-20 NOTE — ED Provider Notes (Signed)
History     CSN: [id number]  Arrival date & time 12/20/12  1100   First MD Initiated Contact with Patient 12/20/12 1108      Chief Complaint  Patient presents with  . Ankle Pain  . Knee Pain    (Consider location/radiation/quality/duration/timing/severity/associated sxs/prior treatment) The history is provided by the patient and medical records.    Thatcher Doberstein is a 47 y.o. male  with a hx of muscular dystrophy, HTN presents to the Emergency Department complaining of gradual, persistent, progressively worsening R ankle pain, L knee pain onset yesterday morning after a fall.  Pt states he got out of bed and his Right knee buckled creating a mechanical fall. Pt did not have an LOC, did not hit his head and has no neck or back pain. Associated symptoms include swelling and pain, gait disturbance 2/2 pain.  Rest, ice makes it better and movement, palpation, attempting to walk makes it worse.  Pt denies fever, chills headache, neck pain, back pain, numbness, tingling, syncope.       Past Medical History  Diagnosis Date  . Myotonic muscular dystrophy   . Hypertension     Past Surgical History  Procedure Laterality Date  . Multiple tooth extractions  2007    Pt had all teeth pulled, has not yet gotten dentures.   . Cholecystectomy      Family History  Problem Relation Age of Onset  . COPD Mother   . Diabetes Father     History  Substance Use Topics  . Smoking status: Current Some Day Smoker -- 0.30 packs/day    Types: Cigarettes  . Smokeless tobacco: Never Used     Comment: Pt states that he has not bought any since mid september, but will smoke if he finds one  . Alcohol Use: No      Review of Systems  Constitutional: Negative for fever, diaphoresis, appetite change, fatigue and unexpected weight change.  HENT: Negative for mouth sores and neck stiffness.   Eyes: Negative for visual disturbance.  Respiratory: Negative for cough, chest tightness, shortness of breath  and wheezing.   Cardiovascular: Negative for chest pain.  Gastrointestinal: Negative for nausea, vomiting, abdominal pain, diarrhea and constipation.  Endocrine: Negative for polydipsia, polyphagia and polyuria.  Genitourinary: Negative for dysuria, urgency, frequency and hematuria.  Musculoskeletal: Positive for joint swelling, arthralgias and gait problem (2/2 pain). Negative for back pain.  Skin: Negative for rash.  Allergic/Immunologic: Negative for immunocompromised state.  Neurological: Negative for syncope, light-headedness and headaches.  Hematological: Does not bruise/bleed easily.  Psychiatric/Behavioral: Negative for sleep disturbance. The patient is not nervous/anxious.   All other systems reviewed and are negative.    Allergies  Review of patient's allergies indicates no known allergies.  Home Medications   Current Outpatient Rx  Name  Route  Sig  Dispense  Refill  . hydrochlorothiazide (HYDRODIURIL) 12.5 MG tablet   Oral   Take 1 tablet (12.5 mg total) by mouth daily.   30 tablet   5   . ibuprofen (ADVIL,MOTRIN) 200 MG tablet   Oral   Take 400 mg by mouth every 6 (six) hours as needed for pain. For pain.         . vitamin C (ASCORBIC ACID) 500 MG tablet   Oral   Take 500 mg by mouth daily.         Marland Kitchen HYDROcodone-acetaminophen (NORCO/VICODIN) 5-325 MG per tablet   Oral   Take 2 tablets by mouth every 4 (four)  hours as needed for pain.   30 tablet   0   . vitamin B-12 (CYANOCOBALAMIN) 1000 MCG tablet   Oral   Take 1,000 mcg by mouth daily.         . Vitamin D, Ergocalciferol, (DRISDOL) 50000 UNITS CAPS      TAKE 1 CAPSULE BY MOUTH ONCE A WEEK   4 capsule   0     BP 138/87  Pulse 77  Temp(Src) 97.6 F (36.4 C) (Oral)  Resp 16  SpO2 100%  Physical Exam  Nursing note and vitals reviewed. Constitutional: He appears well-developed and well-nourished. No distress.  HENT:  Head: Normocephalic and atraumatic.  Mouth/Throat: Oropharynx is  clear and moist. No oropharyngeal exudate.  Eyes: Conjunctivae are normal. No scleral icterus.  Neck: Normal range of motion. Neck supple.  Cardiovascular: Normal rate, regular rhythm and intact distal pulses.   Pulmonary/Chest: Effort normal and breath sounds normal. No respiratory distress. He has no wheezes.  Abdominal: Soft. Bowel sounds are normal. He exhibits no mass. There is no tenderness. There is no rebound and no guarding.  Musculoskeletal: He exhibits no edema.       Left knee: He exhibits decreased range of motion, swelling, deformity and bony tenderness. He exhibits no ecchymosis, no laceration, no erythema and normal alignment. Tenderness found. Medial joint line tenderness noted.       Right ankle: He exhibits decreased range of motion, swelling and ecchymosis. He exhibits no deformity, no laceration and normal pulse. Tenderness. Lateral malleolus tenderness found. Achilles tendon normal.       Cervical back: He exhibits normal range of motion, no tenderness, no bony tenderness, no swelling, no pain and no spasm.       Thoracic back: He exhibits normal range of motion, no tenderness, no bony tenderness, no swelling, no pain and no spasm.       Lumbar back: Normal. He exhibits normal range of motion, no tenderness, no bony tenderness, no swelling, no pain and no spasm.       Right foot: He exhibits tenderness and swelling. He exhibits normal capillary refill, no crepitus and no deformity.       Feet:  Swelling of L knee that appears chronic, but is tender to palpation.   Swelling and ecchymosis of the R ankle and forefoot - pt is unable to ambulate or bear weight 2/2 pain   Neurological: He is alert.  Speech is clear and goal oriented Moves extremities without ataxia  Skin: Skin is warm and dry. He is not diaphoretic.  Psychiatric: He has a normal mood and affect.    ED Course  Procedures (including critical care time)  Labs Reviewed - No data to display Dg Ankle Complete  Right  12/20/2012  *RADIOLOGY REPORT*  Clinical Data: Fall, pain.  RIGHT ANKLE - COMPLETE 3+ VIEW  Comparison: Foot series performed today.  Findings: Again noted is the fracture through the anterior aspect of the navicular.  In addition, linear fragments noted inferior to the lateral malleolus, likely avulsed fragments from the lateral talus.  No fibula or tibia abnormality.  IMPRESSION: Fracture through the anterior navicular seen on the lateral view.  Linear avulsed fragments from the lateral talus inferior to the lateral malleolus.   Original Report Authenticated By: Charlett Nose, M.D.    Dg Knee Complete 4 Views Left  12/20/2012  *RADIOLOGY REPORT*  Clinical Data: Fall, pain.  LEFT KNEE - COMPLETE 4+ VIEW  Comparison: None.  Findings: Curvilinear lucency through  the patella, best seen on an oblique view.  Findings concerning for nondisplaced patellar fracture.  Mild overlying soft tissue swelling.  Trace joint effusion suspected.  IMPRESSION: Finding suspicious for nondisplaced patellar fracture.   Original Report Authenticated By: Charlett Nose, M.D.    Dg Foot Complete Right  12/20/2012  *RADIOLOGY REPORT*  Clinical Data: Fall.  RIGHT FOOT COMPLETE - 3+ VIEW  Comparison: 02/04/2012  Findings: Mild degenerative changes in the first MTP joint. Cortical irregularity and disruption noted along the anterior aspect of the navicular, likely avulsion fracture.  No additional acute bony abnormality.  Soft tissues are intact.  IMPRESSION: Suspect avulsed fragment off the anterior aspect of the navicular.   Original Report Authenticated By: Charlett Nose, M.D.      1. Navicular fracture of ankle   2. Fracture, talus closed   3. Patella fracture       MDM  Rene Kocher presents with swelling, pain and ecchymosis of the R ankle and pain, swelling in the L knee.  X-rays pending, concern for broken foot/ankle.  X-ray with Finding suspicious for nondisplaced patellar fracture along with Fracture through the  anterior navicular seen on the lateral view. Linear avulsed fragments from the lateral talus inferior to the lateral malleolus.  Pt to be placed in posterior ankle splint and knee immobilizer.  Discussed situation with pt and father.  There is no one to care for the pt at home.  Social work contacted for home health assistance.    Pt set up with home health, wheelchair and bedside commode.  Case Management is following and pt is otherwise ready for discharge.    1. Medications: vicodin, usual home medications 2. Treatment: rest, drink plenty of fluids, keep braces in place until evaluated by ortho; alternate ice and heat 3. Follow Up: Please followup with your primary doctor for discussion of your diagnoses and further evaluation after today's visit;               Dahlia Client Kyshawn Teal, PA-C 12/20/12 1511

## 2012-12-21 ENCOUNTER — Telehealth: Payer: Self-pay | Admitting: *Deleted

## 2012-12-21 NOTE — Telephone Encounter (Signed)
Patient was seen in ED yesterday with ankle fx.  Will need follow-up appts with [facility].  Due to patient having Medicaid, office calling to request NPI #  to authorize appt and number of visits.  Called office back and left message to call our office back.    Gaylene Brooks, RN

## 2012-12-22 ENCOUNTER — Telehealth: Payer: Self-pay | Admitting: Family Medicine

## 2012-12-22 NOTE — Telephone Encounter (Signed)
Patient fell and fractured knee and ankle . Patient needs a Microbiologist. [facility] needs a rx for this. [phone]

## 2012-12-22 NOTE — Telephone Encounter (Addendum)
Patient has multiple fractures--ankle, talus, and patella.  Office requesting unlimited visits for one year.  NPI # given.  Gaylene Brooks, RN

## 2012-12-22 NOTE — Telephone Encounter (Signed)
To MD. Contina Strain Dawn  

## 2012-12-27 ENCOUNTER — Ambulatory Visit: Payer: Medicaid Other | Admitting: Family Medicine

## 2012-12-27 MED ORDER — TRANSFER BOARD MISC: 1.0000 | Freq: Once | Status: DC

## 2012-12-27 NOTE — Telephone Encounter (Signed)
Called and left message to clarify- I think they mean Transfer Board, not transport board.  Will Print Rx and fax.

## 2012-12-27 NOTE — Telephone Encounter (Signed)
Rx placed in fax box.

## 2013-01-04 ENCOUNTER — Other Ambulatory Visit (HOSPITAL_COMMUNITY): Payer: Self-pay | Admitting: Orthopedic Surgery

## 2013-01-04 DIAGNOSIS — S8263XA Displaced fracture of lateral malleolus of unspecified fibula, initial encounter for closed fracture: Secondary | ICD-10-CM

## 2013-01-04 DIAGNOSIS — M25562 Pain in left knee: Secondary | ICD-10-CM

## 2013-01-05 ENCOUNTER — Ambulatory Visit: Payer: Medicaid Other | Admitting: Family Medicine

## 2013-01-09 ENCOUNTER — Ambulatory Visit (HOSPITAL_COMMUNITY)
Admission: RE | Admit: 2013-01-09 | Discharge: 2013-01-09 | Disposition: A | Discharge status: Home / Self Care | Payer: Medicaid Other | Source: Ambulatory Visit | Attending: Orthopedic Surgery | Admitting: Orthopedic Surgery

## 2013-01-09 ENCOUNTER — Telehealth: Payer: Self-pay | Admitting: Family Medicine

## 2013-01-09 ENCOUNTER — Ambulatory Visit (HOSPITAL_COMMUNITY): Payer: Medicaid Other

## 2013-01-09 DIAGNOSIS — S82009A Unspecified fracture of unspecified patella, initial encounter for closed fracture: Secondary | ICD-10-CM | POA: Insufficient documentation

## 2013-01-09 DIAGNOSIS — M25569 Pain in unspecified knee: Secondary | ICD-10-CM | POA: Insufficient documentation

## 2013-01-09 DIAGNOSIS — X58XXXA Exposure to other specified factors, initial encounter: Secondary | ICD-10-CM | POA: Insufficient documentation

## 2013-01-09 DIAGNOSIS — M25562 Pain in left knee: Secondary | ICD-10-CM

## 2013-01-09 NOTE — Telephone Encounter (Signed)
Contacted Annette back.  She states that per the ER they were to have PT come out for pt.  Per pt they came out and taught him how to trasfer and got him a transfer board.  She thinks he would benefit upper body strengthening and p4cc will take care of nutrition (pt told Anette his diet mostly consisted of [facility].)  Pt has appt tomorrow, so we decided that MD could decide what was best for him at that time.   Dr. Lula Olszewski, [name] would like for you to give her a callback after pts appointment to discuss next steps. Aaryan Essman, [name] [facility] [facility] [place] [phone]

## 2013-01-09 NOTE — Telephone Encounter (Signed)
Went to see patient last Thursday and he is not having any nurse coming out to help him - thought that [facility] was coming out to do PT and help with his food/medications, but has not seen anyone.  The only thing they did was bring him a transfer board - would like to speak with nurse/doctor to see what we can do to help him. States that he is very underweight and can see his whole skeleton - no muscle mass at all

## 2013-01-10 ENCOUNTER — Encounter: Payer: Self-pay | Admitting: Family Medicine

## 2013-01-10 ENCOUNTER — Ambulatory Visit (INDEPENDENT_AMBULATORY_CARE_PROVIDER_SITE_OTHER): Payer: Medicaid Other | Admitting: Family Medicine

## 2013-01-10 VITALS — BP 111/77 | HR 75 | Temp 98.8°F

## 2013-01-10 DIAGNOSIS — S82891D Other fracture of right lower leg, subsequent encounter for closed fracture with routine healing: Secondary | ICD-10-CM

## 2013-01-10 DIAGNOSIS — I10 Essential (primary) hypertension: Secondary | ICD-10-CM

## 2013-01-10 DIAGNOSIS — S8290XD Unspecified fracture of unspecified lower leg, subsequent encounter for closed fracture with routine healing: Secondary | ICD-10-CM

## 2013-01-10 DIAGNOSIS — S82891A Other fracture of right lower leg, initial encounter for closed fracture: Secondary | ICD-10-CM | POA: Insufficient documentation

## 2013-01-10 DIAGNOSIS — G7111 Myotonic muscular dystrophy: Secondary | ICD-10-CM

## 2013-01-10 NOTE — Assessment & Plan Note (Signed)
Well controlled, continue HCTZ 

## 2013-01-10 NOTE — Assessment & Plan Note (Signed)
Reviewed films- will defer to Ortho for management at this time, but did discuss with patient to plan on physical therapy when Ortho clears him.

## 2013-01-10 NOTE — Patient Instructions (Signed)
Your blood pressure today was BP: 111/77 mmHg.  Remember your goal blood pressure is about 130/80.  Please be sure to take your medication every day.    Please ask your orthopedist to send me a note after you see them.

## 2013-01-10 NOTE — Assessment & Plan Note (Signed)
Concern that his weakness is worsening.  Patient not self-advocate as far as Myotonic Muscular Dystrophy and following up with Neurology.  I have told him that this is a disease that must be managed by a subspecialist such as those at [facility].  Unfortunately, there is not much in the way of treatment for this disease.  I will ask or [facility] team to contact patient to see what assistance we can offer.

## 2013-01-10 NOTE — Progress Notes (Signed)
  Subjective:    Patient ID: Malik [name], male    DOB: 17-Apr-1966, 47 y.o.   MRN: [id number]  HPI:  Malik [name] comes in for follow up.  He had a fall on 2/19 and has a right navicular avulsion fracture and a left anterior navicular fracture and lateral talus avulsion fracture.  He had an MRI of his left knee yesterday which I have reviewed and it shows a patellar fracture.    Malik [name] says he fell because his leg "buckled" when he was trying to stand.  He says he was not using his cane and that he almost never uses his walker.   He is currently non-ambulatory.  He has home health nurse aid and friends and aunts that are helping care for him.  He says he feels like he is pretty well cared for.  Myotonic Muscular dystrophy: He was seen at [facility] last September, he seems displeased with them as he says they were supposed to call him with his follow up appointment but he has not heard from them.   HTN: Taking HCTZ, no chest pain, dizziness, palpitations, LE edema.   Past Medical History  Diagnosis Date  . Myotonic muscular dystrophy   . Hypertension     History  Substance Use Topics  . Smoking status: Current Some Day Smoker -- 0.30 packs/day    Types: Cigarettes  . Smokeless tobacco: Never Used     Comment: Pt states that he has not bought any since mid september, but will smoke if he finds one  . Alcohol Use: No    Family History  Problem Relation Age of Onset  . COPD Mother   . Diabetes Father      ROS Pertinent items in HPI    Objective:  Physical Exam:  BP 111/77  Pulse 75  Temp(Src) 98.8 F (37.1 C) (Oral) General: cachectic alert, cooperative and no distress, lying in stretcher.  Head: Normocephalic, without obvious abnormality, atraumatic Lungs: clear to auscultation bilaterally Heart: regular rate and rhythm, S1, S2 normal, no murmur, click, rub or gallop Pulses: 2+ and symmetric       Assessment & Plan:

## 2013-01-11 NOTE — Progress Notes (Signed)
Left message for patient contact me.

## 2013-01-11 NOTE — Telephone Encounter (Signed)
Patient seen yesterday and care plan discussed with Aunt.

## 2013-01-22 ENCOUNTER — Telehealth: Payer: Self-pay | Admitting: Family Medicine

## 2013-01-22 DIAGNOSIS — S82891D Other fracture of right lower leg, subsequent encounter for closed fracture with routine healing: Secondary | ICD-10-CM

## 2013-01-22 DIAGNOSIS — G7111 Myotonic muscular dystrophy: Secondary | ICD-10-CM

## 2013-01-22 DIAGNOSIS — M6281 Muscle weakness (generalized): Secondary | ICD-10-CM

## 2013-01-22 NOTE — Telephone Encounter (Signed)
Wife is calling wishing to discuss home therapy for Mr. Revard that was faxed over by Nila Nephew.

## 2013-01-23 NOTE — Telephone Encounter (Signed)
Spoke with Mr. Kail, he states that Nila Nephew is from [facility].  States that he thought she was faxing something for Dr. Lula Olszewski to sign.  To MD .Milas Gain, [name]

## 2013-01-25 MED ORDER — MIRTAZAPINE 15 MG PO TABS: 15.0000 mg | ORAL_TABLET | Freq: Every day | ORAL | Status: DC

## 2013-01-25 NOTE — Telephone Encounter (Signed)
Rx for Mirtazapine, antidepressant with appetite stimulation sent in to start taking at bedtime.   Rx for home health PT written.   Form filled out a second time, will be faxed today.

## 2013-01-25 NOTE — Telephone Encounter (Signed)
I filled out paperwork last week or the week before- I have not seen anything recently from Home health for Malik [name].  If there is still something missing we need a new fax from home health.  Please let him know.

## 2013-01-26 NOTE — Telephone Encounter (Signed)
New Home health order written- [facility] will not do HHPT, please see if a different agency will.

## 2013-01-26 NOTE — Addendum Note (Signed)
Addended by: Ardyth Gal on: 01/26/2013 09:24 AM   Modules accepted: Orders

## 2013-01-26 NOTE — Telephone Encounter (Signed)
Have attempted two different Home Health Agencies with same response.  Medicaid only approves 3 PT visits a year, and [facility] has probably used a couple when teaching him to transfer.  Have LMOVM for [name] to call me back on Monday, and LMOVM informing Aunt that we were still working on it. Malik [name], [name]

## 2013-01-29 NOTE — Telephone Encounter (Signed)
Spoke with [name] [facility]) and advised that [facility] PT will not be an option.   Have LMOVM asking aunt to call us back to see if she thinks that outpatient PT would be an option. Will await callback from them.  [place], [name]

## 2013-01-29 NOTE — Telephone Encounter (Signed)
Referral for ambulatory PT and OT ordered.

## 2013-01-29 NOTE — Telephone Encounter (Signed)
Ambulance will be able to take him and we just need to set the appt up for OP PT and she can call them the morning of appt for them to take him

## 2013-01-29 NOTE — Telephone Encounter (Signed)
Couple of problems with referral:  1. We are not on Medicaid card, per [place] Tracks  2.  I contacted [facility] and they have never seen the patient, so will not authorize visit.  3. LMOVM for Aunt to callback to have it changed.  He can either call the social worker and have them change the card, or come here and sign a form.  Unfortunately, unless he gets a letter from Social services stating that we can go ahead with the referral, the name change will not be on the card until May 1st, therefore the referral cant be done until then.  4. LMOVM for [name] to callback as well. Khrista Braun, [name]

## 2013-01-29 NOTE — Telephone Encounter (Signed)
Aunt called back very upset stating that they have "called Social Service twice and nothing has happened"  I apologized but stated "legally our hands are tied and we are unable to complete referral until this is changed"  Malik [name] states "ok I  Know its not your fault but I have to go"  Will check [name] tracks tomorrow to see if maybe it was changed. [name], [name]

## 2013-01-29 NOTE — Addendum Note (Signed)
Addended by: Ardyth Gal on: 01/29/2013 02:50 PM   Modules accepted: Orders

## 2013-01-30 NOTE — Telephone Encounter (Signed)
Per [place] Tracks, Mr. Shimko's Medicaid card has been updated.  I called [facility] to try and expedite this referral.  They are going to call him today to get him scheduled.  Ileana Ladd

## 2013-02-09 ENCOUNTER — Telehealth: Payer: Self-pay | Admitting: *Deleted

## 2013-02-09 NOTE — Telephone Encounter (Signed)
Patient has an appt today at 2:00 pm with Dr. Victorino Dike for ankle follow-up.  Due to patient having Medicaid, office calling to request NPI #  to authorize appt.  NPI # given.  Gaylene Brooks, RN

## 2013-02-20 ENCOUNTER — Ambulatory Visit: Payer: Medicaid Other

## 2013-02-21 ENCOUNTER — Ambulatory Visit: Payer: Medicaid Other | Attending: Family Medicine | Admitting: Rehabilitative and Restorative Service Providers"

## 2013-02-21 DIAGNOSIS — M25669 Stiffness of unspecified knee, not elsewhere classified: Secondary | ICD-10-CM | POA: Insufficient documentation

## 2013-02-21 DIAGNOSIS — M25569 Pain in unspecified knee: Secondary | ICD-10-CM | POA: Insufficient documentation

## 2013-02-21 DIAGNOSIS — R5381 Other malaise: Secondary | ICD-10-CM | POA: Insufficient documentation

## 2013-02-21 DIAGNOSIS — IMO0001 Reserved for inherently not codable concepts without codable children: Secondary | ICD-10-CM | POA: Insufficient documentation

## 2013-02-21 DIAGNOSIS — R269 Unspecified abnormalities of gait and mobility: Secondary | ICD-10-CM | POA: Insufficient documentation

## 2013-02-22 ENCOUNTER — Ambulatory Visit: Payer: Medicaid Other | Admitting: Physical Therapy

## 2013-02-27 ENCOUNTER — Telehealth: Payer: Self-pay | Admitting: Family Medicine

## 2013-02-27 NOTE — Telephone Encounter (Signed)
Is calling about his son - he is not able to care for himself or even walk - they are wanting help to get him into a assisted living and/or rehab place.  They are realizing that this is a 24/7 job to take care of him and they don't have enough help to do that. He just needs advise as to what to do and where to turn.

## 2013-02-27 NOTE — Telephone Encounter (Signed)
Malik [name], Malik [name] is a 47 year old with Myotonic Multiple Dystrophy, which is a progressive disease where people's muscles slowly get weaker.  There is no cure for it.  Do you have any ideas about the best way to get him into a care facility or other resources? Thanks for your help.    Edgel Degnan 02/27/2013 3:05 PM

## 2013-03-01 ENCOUNTER — Other Ambulatory Visit: Payer: Self-pay | Admitting: Family Medicine

## 2013-03-05 NOTE — Telephone Encounter (Signed)
Malik [name] is out sick for the next few weeks and father is needing help in finding a place that will take his son in his condition.  pls advise

## 2013-03-05 NOTE — Telephone Encounter (Signed)
Called and spoke to [place], patient's father.  Nathanyel is so weak he can no longer walk.  He cannot take care of himself, and they are having trouble making sure someone is with him at all times, but do not think he is safe to be home alone.   I apologized for the delay- I had sent the note to our Child psychotherapist and she is out sick. He understands.   I discussed the case with attendings and clinic staff.  I have put in a referral to case management to see what assistance they can offer.  I also asked pt's father to schedule a face-to-face visit so that we can fill out an FL2.  From there, patient and his family will be able to look for long-term-care facilities.    Khale Nigh 03/05/2013 3:34 PM

## 2013-03-05 NOTE — Telephone Encounter (Signed)
Father called back asking if we have heard anything -

## 2013-03-06 ENCOUNTER — Ambulatory Visit: Payer: Medicaid Other | Attending: Family Medicine | Admitting: Physical Therapy

## 2013-03-06 DIAGNOSIS — M25569 Pain in unspecified knee: Secondary | ICD-10-CM | POA: Insufficient documentation

## 2013-03-06 DIAGNOSIS — R5381 Other malaise: Secondary | ICD-10-CM | POA: Insufficient documentation

## 2013-03-06 DIAGNOSIS — R269 Unspecified abnormalities of gait and mobility: Secondary | ICD-10-CM | POA: Insufficient documentation

## 2013-03-06 DIAGNOSIS — IMO0001 Reserved for inherently not codable concepts without codable children: Secondary | ICD-10-CM | POA: Insufficient documentation

## 2013-03-06 DIAGNOSIS — M25669 Stiffness of unspecified knee, not elsewhere classified: Secondary | ICD-10-CM | POA: Insufficient documentation

## 2013-03-07 ENCOUNTER — Other Ambulatory Visit: Payer: Self-pay | Admitting: *Deleted

## 2013-03-07 MED ORDER — VITAMIN D (ERGOCALCIFEROL) 1.25 MG (50000 UNIT) PO CAPS: 50000.0000 [IU] | ORAL_CAPSULE | ORAL | Status: DC

## 2013-03-09 ENCOUNTER — Ambulatory Visit (INDEPENDENT_AMBULATORY_CARE_PROVIDER_SITE_OTHER): Payer: Medicaid Other | Admitting: Family Medicine

## 2013-03-09 ENCOUNTER — Encounter: Payer: Self-pay | Admitting: Family Medicine

## 2013-03-09 VITALS — BP 127/81 | HR 71 | Temp 98.1°F

## 2013-03-09 DIAGNOSIS — G7111 Myotonic muscular dystrophy: Secondary | ICD-10-CM

## 2013-03-09 NOTE — Progress Notes (Signed)
  Subjective:    Patient ID: Malik [name], male    DOB: September 11, 1966, 47 y.o.   MRN: [id number]  HPI:  [name] comes in for face to face to discuss worsening clinical status.  He has myotonic muscular dystrophy and is getting weaker and weaker. He can no longer walk without another person helping, can barely stand.  He cannot dress himself, cannot get to the bathroom alone, cannot bathe himself.  He can still feed himself when food plate placed in front of him, but cannot prepare meals.  His father and his aunts have been caring for him, and he is now requiring 24 hour supervision, which they cannot always provide.  They are interested in looking into him being placed at an Assisted living facility.   We discussed the likely progression of MMD, and patient understands that he may develop weakness in chewing/swallowing muscles, may have weakness of respiratory muscles, or of cardiac muscle.  He says he would not want a feeding tube, to have a trach/vent, any artificial life support.  He also says he would like to be a DNR.  He says if it is his time to go, to let him go.   Past Medical History  Diagnosis Date  . Myotonic muscular dystrophy   . Hypertension     History  Substance Use Topics  . Smoking status: Current Some Day Smoker -- 0.30 packs/day    Types: Cigarettes  . Smokeless tobacco: Never Used     Comment: Pt states that he has not bought any since mid september, but will smoke if he finds one  . Alcohol Use: No    Family History  Problem Relation Age of Onset  . COPD Mother   . Diabetes Father      ROS Pertinent items in HPI    Objective:  Physical Exam:  BP 127/81  Pulse 71  Temp(Src) 98.1 F (36.7 C) (Oral) General appearance: alert, cooperative cachectic  Head: temporal wasting Lungs: clear to auscultation bilaterally Heart: regular rate and rhythm, S1, S2 normal, no murmur, click, rub or gallop Pulses: 2+ and symmetric Neuro: strength in upper extremities is  2-3/5 and symmetric, in LE is 1-2/5 and symmetric.  Sensation in tact.  Extrem: Muscle wasting throughout       Assessment & Plan:

## 2013-03-09 NOTE — Assessment & Plan Note (Addendum)
Now with weakness so severe he is requiring 24 hour care.  I have made a case management referral who are working on a list of facilities that take Medicaid.  I will fill out the FL2.    Also had DNR filled out and notarized today, and gave patient and his father information about HCPOA and living will.   Spent >25 minutes of face to face time with patient and his father discussing care goals and arrangement of future plans.

## 2013-03-09 NOTE — Patient Instructions (Signed)
It was good to see you.  I am sorry you are having a difficult time.  I will fill out your FL-2 form, we will call you next week when it is ready.  Your Medicaid card is updated appropriately.    If you do not hear from Case Management next week please call the office and let me know.   I also recommend calling [facility] and making an appointment.

## 2013-03-12 ENCOUNTER — Telehealth: Payer: Self-pay | Admitting: Family Medicine

## 2013-03-12 NOTE — Telephone Encounter (Signed)
Please call patient or his father Malik [name]) and let them know that I have filled out the [facility] and that case management sent a list of facilities that accept Medicaid.  They are in the box up front for them to pick up.

## 2013-03-12 NOTE — Telephone Encounter (Signed)
LMOM advising pt of papers ready to be picked up.

## 2013-03-13 ENCOUNTER — Ambulatory Visit: Payer: Medicaid Other | Admitting: Physical Therapy

## 2013-03-20 ENCOUNTER — Ambulatory Visit: Payer: Medicaid Other | Admitting: Physical Therapy

## 2013-03-22 ENCOUNTER — Telehealth: Payer: Self-pay | Admitting: Family Medicine

## 2013-03-22 NOTE — Telephone Encounter (Signed)
Father is confused about FL2. Should it be skilled nursing or assisted living? Please advise

## 2013-03-22 NOTE — Telephone Encounter (Signed)
According to Dr. Melina Modena office visit note from 03/09/13, assisted living is what is needed.  LMOVM with patient's father regarding this.  Eladio Dentremont, Darlyne Russian, CMA

## 2013-03-22 NOTE — Telephone Encounter (Signed)
Father states pt needs TB shot Please advise

## 2013-03-22 NOTE — Telephone Encounter (Signed)
Called left voice mail- I had Dr. Leveda Anna help me fill out the [facility], he felt [facility] was more appropriate for Skilled nursing, but I know K[facility] was hoping to go to assisted living.  I asked them to let us know if we need to re-do the form.

## 2013-03-22 NOTE — Telephone Encounter (Signed)
Will refer to MD for clarification before I call patient.  Malik [name], Darlyne Russian, CMA

## 2013-03-22 NOTE — Telephone Encounter (Signed)
Pt's father called. The FL2 shows skilled nursing not assisted living. Is another correct form needed?

## 2013-03-23 NOTE — Telephone Encounter (Signed)
Attempted x 3 to return 03/22/13. LMOVM for patients father to call and schedule RN visit for TB skin test on a M, Tu, W. .Rosy Estabrook L, CMA

## 2013-03-25 ENCOUNTER — Other Ambulatory Visit: Payer: Self-pay | Admitting: Family Medicine

## 2013-03-27 ENCOUNTER — Telehealth: Payer: Self-pay | Admitting: Clinical

## 2013-03-27 NOTE — Telephone Encounter (Signed)
Is asking that the FL2 shot that he is needing Assisted living NOT skilled - pls admend and call when ready

## 2013-03-27 NOTE — Telephone Encounter (Signed)
FL2 printed, signed, and placed in envelope up front for pick up.  Please notify patient's family.

## 2013-03-27 NOTE — Telephone Encounter (Signed)
I have contacted Theresia Bough, our social worker to assist with filling out the FL2.

## 2013-03-27 NOTE — Telephone Encounter (Signed)
Pt's father called and notified.  He is on his way to pick up forms.  Janaiya Beauchesne, Darlyne Russian, CMA

## 2013-03-27 NOTE — Telephone Encounter (Signed)
Clinical Child psychotherapist (CSW) contacted pt father and informed him that CSW would be completing requested FL2 today. CSW also contacted [facility] [phone], Tammy in admissions, and faxed over pt FL2 and pt pasarr #. Pt father very appreciative of assistance and states he hopes for pt to be admitted into the facility this week. Pt father will pick-up FL2 from clinic once signed by MD.  CSW has emailed FL2 to MD for signature and review.  Theresia Bough, MSW, Theresia Majors [phone]

## 2013-03-27 NOTE — Telephone Encounter (Signed)
Will fwd to MD.  Rylyn Ranganathan L, CMA  

## 2013-03-28 ENCOUNTER — Ambulatory Visit (INDEPENDENT_AMBULATORY_CARE_PROVIDER_SITE_OTHER): Payer: Medicaid Other | Admitting: *Deleted

## 2013-03-28 DIAGNOSIS — Z111 Encounter for screening for respiratory tuberculosis: Secondary | ICD-10-CM

## 2013-03-28 NOTE — Progress Notes (Signed)
PPD Placement note Malik [name], 47 y.o. male is here today for placement of PPD test Reason for PPD test: entry into nursing home Pt taken PPD test before: no Verified in allergy area and with patient that they are not allergic to the products PPD is made of (Phenol or Tween). Yes Is patient taking any oral or IV steroid medication now or have they taken it in the last month? no Has the patient ever received the BCG vaccine?: no Has the patient been in recent contact with anyone known or suspected of having active TB disease?: no    O: Alert and oriented in NAD. P:  PPD placed on 03/28/2013.  Patient advised to return for reading within 48-72 hours.

## 2013-03-30 ENCOUNTER — Ambulatory Visit (INDEPENDENT_AMBULATORY_CARE_PROVIDER_SITE_OTHER): Payer: Medicaid Other | Admitting: *Deleted

## 2013-03-30 ENCOUNTER — Encounter: Payer: Self-pay | Admitting: *Deleted

## 2013-03-30 DIAGNOSIS — Z111 Encounter for screening for respiratory tuberculosis: Secondary | ICD-10-CM

## 2013-03-30 LAB — TB SKIN TEST
Induration: 0 mm
TB Skin Test: NEGATIVE

## 2013-03-30 NOTE — Progress Notes (Signed)
PPD Reading Note PPD read and results entered in EpicCare. Result 0 mm induration. Interpretation: Negative Documentation given to patient via [facility] transportation personnel.  Gaylene Brooks, RN

## 2013-04-02 ENCOUNTER — Telehealth: Payer: Self-pay | Admitting: Family Medicine

## 2013-04-02 DIAGNOSIS — G7111 Myotonic muscular dystrophy: Secondary | ICD-10-CM

## 2013-04-02 DIAGNOSIS — S82891D Other fracture of right lower leg, subsequent encounter for closed fracture with routine healing: Secondary | ICD-10-CM

## 2013-04-02 DIAGNOSIS — I10 Essential (primary) hypertension: Secondary | ICD-10-CM

## 2013-04-02 MED ORDER — VITAMIN D (ERGOCALCIFEROL) 1.25 MG (50000 UNIT) PO CAPS: 50000.0000 [IU] | ORAL_CAPSULE | ORAL | Status: DC

## 2013-04-02 MED ORDER — MIRTAZAPINE 15 MG PO TABS: 15.0000 mg | ORAL_TABLET | Freq: Every day | ORAL | Status: DC

## 2013-04-02 MED ORDER — HYDROCODONE-ACETAMINOPHEN 5-325 MG PO TABS: 2.0000 | ORAL_TABLET | ORAL | Status: DC | PRN

## 2013-04-02 MED ORDER — VITAMIN C 500 MG PO TABS: 500.0000 mg | ORAL_TABLET | Freq: Every day | ORAL | Status: DC

## 2013-04-02 MED ORDER — VITAMIN B-12 1000 MCG PO TABS: 1000.0000 ug | ORAL_TABLET | Freq: Every day | ORAL | Status: DC

## 2013-04-02 MED ORDER — IBUPROFEN 200 MG PO TABS: 400.0000 mg | ORAL_TABLET | Freq: Four times a day (QID) | ORAL | Status: DC | PRN

## 2013-04-02 MED ORDER — HYDROCHLOROTHIAZIDE 12.5 MG PO TABS: 12.5000 mg | ORAL_TABLET | Freq: Every day | ORAL | Status: DC

## 2013-04-02 NOTE — Telephone Encounter (Signed)
Rx's printed and placed in fax pile to go to [facility].

## 2013-04-02 NOTE — Telephone Encounter (Signed)
Patient needs Rx for all meds sent to [facility] for HCTZ 12.5 mg, 1tab by mouth daily, Norco/Vicodin 5/325 take 2 tabs q 4 hours by mouth as needed for pain, Ibuprofen 200mg  take 400mg  every 6 hourse as needed for pain, Remeron 15mg  1 tab by mouth at bedtime, Vitamin B12 , take by mouth daily, Vitamin c 500mg  take 500mg  by mouth daily and Vitamin d 50,000 take 1 capsule by mouth every 7 days.  He is being admitted to the unit today and they need this today.  The number for the pharmacy is [phone]/Rx Care, ask for [facility].

## 2013-04-02 NOTE — Telephone Encounter (Signed)
will FWD to MD for medication approval.  Radene Ou, CMA

## 2013-04-03 ENCOUNTER — Telehealth: Payer: Self-pay | Admitting: Family Medicine

## 2013-04-03 NOTE — Telephone Encounter (Signed)
Called and asked for Malik [name] at [facility], said I was [name]'s doctor, was placed on hold for >5 minutes at which time I hung up.

## 2013-04-03 NOTE — Telephone Encounter (Signed)
Victorino Dike with [facility] is requesting a dietary change for pt. Orders show high calorie diet but father requests regular diet with chopped meat. Victorino Dike requests [employer] daily with meals Please advise phone [phone]

## 2013-04-06 ENCOUNTER — Encounter (HOSPITAL_COMMUNITY): Payer: Self-pay | Admitting: Emergency Medicine

## 2013-04-06 ENCOUNTER — Emergency Department (HOSPITAL_COMMUNITY)
Admission: EM | Admit: 2013-04-06 | Discharge: 2013-04-06 | Disposition: A | Discharge status: Home / Self Care | Payer: Medicaid Other | Attending: Emergency Medicine | Admitting: Emergency Medicine

## 2013-04-06 DIAGNOSIS — F17200 Nicotine dependence, unspecified, uncomplicated: Secondary | ICD-10-CM | POA: Insufficient documentation

## 2013-04-06 DIAGNOSIS — W1809XA Striking against other object with subsequent fall, initial encounter: Secondary | ICD-10-CM | POA: Insufficient documentation

## 2013-04-06 DIAGNOSIS — Y9210 Unspecified residential institution as the place of occurrence of the external cause: Secondary | ICD-10-CM | POA: Insufficient documentation

## 2013-04-06 DIAGNOSIS — G7111 Myotonic muscular dystrophy: Secondary | ICD-10-CM | POA: Insufficient documentation

## 2013-04-06 DIAGNOSIS — Z79899 Other long term (current) drug therapy: Secondary | ICD-10-CM | POA: Insufficient documentation

## 2013-04-06 DIAGNOSIS — I10 Essential (primary) hypertension: Secondary | ICD-10-CM | POA: Insufficient documentation

## 2013-04-06 DIAGNOSIS — S0990XA Unspecified injury of head, initial encounter: Secondary | ICD-10-CM

## 2013-04-06 DIAGNOSIS — Y939 Activity, unspecified: Secondary | ICD-10-CM | POA: Insufficient documentation

## 2013-04-06 MED ORDER — ACETAMINOPHEN 325 MG PO TABS
650.0000 mg | ORAL_TABLET | Freq: Once | ORAL | Status: AC
  Administered 2013-04-06: 650 mg via ORAL
  Filled 2013-04-06: qty 2

## 2013-04-06 NOTE — ED Notes (Signed)
Assessed pt's head no redness, bleeding, or swelling to are, only tenderness to touch to left side.

## 2013-04-06 NOTE — ED Notes (Signed)
Per EMS: Pt from [facility]. pt was on toilet and slipped hitting head on sink and wall. Tenderness to left side of head, no bruising or redness to area. No other complaints.

## 2013-04-06 NOTE — ED Provider Notes (Signed)
History     CSN: [id number]  Arrival date & time 04/06/13  1124   First MD Initiated Contact with Patient 04/06/13 1126      Chief Complaint  Patient presents with  . Fall    HPI Patient presents from [facility] after slipping and falling hitting the back of his head on the sink and wall.  No loss consciousness.  No anticoagulant use.  The patient reports he had a mild headache earlier today but reports his headache is significantly improved.  He was sent to the emergency apartment by staff per protocol.  He denies chest pain shortness breath.  No abdominal pain.  No weakness of his upper lower extremities.  No neck pain.  No loss consciousness.  No nausea or vomiting.  Symptoms are mild and resolving on her on a   Past Medical History  Diagnosis Date  . Myotonic muscular dystrophy   . Hypertension     Past Surgical History  Procedure Laterality Date  . Multiple tooth extractions  2007    Pt had all teeth pulled, has not yet gotten dentures.   . Cholecystectomy      Family History  Problem Relation Age of Onset  . COPD Mother   . Diabetes Father     History  Substance Use Topics  . Smoking status: Current Some Day Smoker -- 0.30 packs/day    Types: Cigarettes  . Smokeless tobacco: Never Used     Comment: Pt states that he has not bought any since mid september, but will smoke if he finds one  . Alcohol Use: No      Review of Systems  All other systems reviewed and are negative.    Allergies  Review of patient's allergies indicates no known allergies.  Home Medications   Current Outpatient Rx  Name  Route  Sig  Dispense  Refill  . hydrochlorothiazide (MICROZIDE) 12.5 MG capsule   Oral   Take 12.5 mg by mouth every morning.         Marland Kitchen HYDROcodone-acetaminophen (NORCO/VICODIN) 5-325 MG per tablet   Oral   Take 2 tablets by mouth every 4 (four) hours as needed for pain.         Marland Kitchen ibuprofen (ADVIL,MOTRIN) 200 MG tablet   Oral   Take  400 mg by mouth every 6 (six) hours as needed for pain.         . mirtazapine (REMERON) 15 MG tablet   Oral   Take 15 mg by mouth at bedtime.         . vitamin B-12 (CYANOCOBALAMIN) 1000 MCG tablet   Oral   Take 1,000 mcg by mouth daily.         . vitamin C (ASCORBIC ACID) 500 MG tablet   Oral   Take 500 mg by mouth daily.         . Vitamin D, Ergocalciferol, (DRISDOL) 50000 UNITS CAPS   Oral   Take 50,000 Units by mouth every Sunday.           BP 109/75  Pulse 68  Temp(Src) 98.3 F (36.8 C) (Oral)  Resp 16  SpO2 97%  Physical Exam  Nursing note and vitals reviewed. Constitutional: He is oriented to person, place, and time. He appears well-developed and well-nourished.  HENT:  Head: Normocephalic and atraumatic.  Eyes: EOM are normal.  Neck: Normal range of motion.  Cardiovascular: Normal rate, regular rhythm, normal heart sounds and intact distal pulses.  Pulmonary/Chest: Effort normal and breath sounds normal. No respiratory distress.  Abdominal: Soft. He exhibits no distension. There is no tenderness.  Genitourinary: Rectum normal.  Musculoskeletal: Normal range of motion.  Neurological: He is alert and oriented to person, place, and time.  Skin: Skin is warm and dry.  Psychiatric: He has a normal mood and affect. Judgment normal.    ED Course  Procedures (including critical care time)  Labs Reviewed - No data to display No results found.   No diagnosis found.    MDM  Minor closed head injury. Normal neuro exam. No indication for imaging at this time. Doubt intracranial bleed. Doubt skull fracture. Pt given information on head trauma including strict instructions to return for nausea/vomiting, confusion, altered level of consciousness or new weakness. Pt understands and is agreeable to the plan         Lyanne Co, MD 04/06/13 1233

## 2013-04-06 NOTE — ED Notes (Signed)
[id number]<AV> Expected date:<BR> Expected time:<BR> Means of arrival:<BR> Comments:<BR> ems- fall, hit head on sink, no complaints but bump on head

## 2013-05-10 ENCOUNTER — Telehealth: Payer: Self-pay | Admitting: Family Medicine

## 2013-05-10 NOTE — Telephone Encounter (Signed)
The facility needs documentation for the patients chart about what kind of diet the patient can be on.  The currently have him on a regular diet with Mighty Shakes twice daily.  Can this be ok'd and faxed to [phone].

## 2013-05-11 NOTE — Telephone Encounter (Signed)
Has this been addressed?

## 2013-05-11 NOTE — Telephone Encounter (Signed)
The order needs to be signed and faxed to nursing home by Dr. Karie Schwalbe. - Diet regular meals with Mighty Shakes. Thanks!

## 2013-05-12 NOTE — Telephone Encounter (Signed)
Called facility and placed verbal order as it is the weekend and I cannot fax today because I am not in the office. They are okay with verbal until Monday at which point I will send fax.

## 2013-05-17 NOTE — Telephone Encounter (Signed)
Diet order faxed to number provided and called to let [facility] know.

## 2013-05-26 ENCOUNTER — Inpatient Hospital Stay (HOSPITAL_COMMUNITY)
Admission: EM | Admit: 2013-05-26 | Discharge: 2013-05-28 | DRG: 871 | Disposition: A | Discharge status: Skilled Nursing Facility | Payer: Medicaid Other | Attending: Family Medicine | Admitting: Family Medicine

## 2013-05-26 ENCOUNTER — Encounter (HOSPITAL_COMMUNITY): Payer: Self-pay | Admitting: Emergency Medicine

## 2013-05-26 ENCOUNTER — Emergency Department (HOSPITAL_COMMUNITY): Payer: Medicaid Other

## 2013-05-26 ENCOUNTER — Telehealth: Payer: Self-pay | Admitting: Family Medicine

## 2013-05-26 DIAGNOSIS — F17200 Nicotine dependence, unspecified, uncomplicated: Secondary | ICD-10-CM | POA: Diagnosis present

## 2013-05-26 DIAGNOSIS — E440 Moderate protein-calorie malnutrition: Secondary | ICD-10-CM | POA: Diagnosis present

## 2013-05-26 DIAGNOSIS — K219 Gastro-esophageal reflux disease without esophagitis: Secondary | ICD-10-CM | POA: Diagnosis present

## 2013-05-26 DIAGNOSIS — Z66 Do not resuscitate: Secondary | ICD-10-CM | POA: Diagnosis present

## 2013-05-26 DIAGNOSIS — E559 Vitamin D deficiency, unspecified: Secondary | ICD-10-CM | POA: Diagnosis present

## 2013-05-26 DIAGNOSIS — G7111 Myotonic muscular dystrophy: Secondary | ICD-10-CM | POA: Diagnosis present

## 2013-05-26 DIAGNOSIS — I10 Essential (primary) hypertension: Secondary | ICD-10-CM | POA: Diagnosis present

## 2013-05-26 DIAGNOSIS — R066 Hiccough: Secondary | ICD-10-CM | POA: Diagnosis present

## 2013-05-26 DIAGNOSIS — A419 Sepsis, unspecified organism: Principal | ICD-10-CM | POA: Diagnosis present

## 2013-05-26 DIAGNOSIS — J189 Pneumonia, unspecified organism: Secondary | ICD-10-CM | POA: Diagnosis present

## 2013-05-26 DIAGNOSIS — R6520 Severe sepsis without septic shock: Secondary | ICD-10-CM | POA: Diagnosis present

## 2013-05-26 DIAGNOSIS — R42 Dizziness and giddiness: Secondary | ICD-10-CM

## 2013-05-26 DIAGNOSIS — M6281 Muscle weakness (generalized): Secondary | ICD-10-CM

## 2013-05-26 DIAGNOSIS — R0902 Hypoxemia: Secondary | ICD-10-CM

## 2013-05-26 DIAGNOSIS — E876 Hypokalemia: Secondary | ICD-10-CM | POA: Diagnosis present

## 2013-05-26 DIAGNOSIS — E538 Deficiency of other specified B group vitamins: Secondary | ICD-10-CM | POA: Diagnosis present

## 2013-05-26 DIAGNOSIS — Z720 Tobacco use: Secondary | ICD-10-CM

## 2013-05-26 DIAGNOSIS — IMO0002 Reserved for concepts with insufficient information to code with codable children: Secondary | ICD-10-CM

## 2013-05-26 DIAGNOSIS — Z833 Family history of diabetes mellitus: Secondary | ICD-10-CM

## 2013-05-26 LAB — COMPREHENSIVE METABOLIC PANEL
ALT: 63 U/L — ABNORMAL HIGH (ref 0–53)
AST: 52 U/L — ABNORMAL HIGH (ref 0–37)
Albumin: 3.2 g/dL — ABNORMAL LOW (ref 3.5–5.2)
Alkaline Phosphatase: 109 U/L (ref 39–117)
BUN: 14 mg/dL (ref 6–23)
CO2: 26 mEq/L (ref 19–32)
Calcium: 9.9 mg/dL (ref 8.4–10.5)
Chloride: 95 mEq/L — ABNORMAL LOW (ref 96–112)
Creatinine, Ser: 0.49 mg/dL — ABNORMAL LOW (ref 0.50–1.35)
GFR calc Af Amer: >90 mL/min (ref >90)
GFR calc non Af Amer: >90 mL/min (ref >90)
Glucose, Bld: 111 mg/dL — ABNORMAL HIGH (ref 70–99)
Potassium: 3.7 mEq/L (ref 3.5–5.1)
Sodium: 135 mEq/L (ref 135–145)
Total Bilirubin: 1.8 mg/dL — ABNORMAL HIGH (ref 0.3–1.2)
Total Protein: 7.3 g/dL (ref 6.0–8.3)

## 2013-05-26 LAB — CBC
HCT: 44.8 % (ref 39.0–52.0)
Hemoglobin: 15.4 g/dL (ref 13.0–17.0)
MCH: 31.4 pg (ref 26.0–34.0)
MCHC: 34.4 g/dL (ref 30.0–36.0)
MCV: 91.4 fL (ref 78.0–100.0)
Platelets: 199 10*3/uL (ref 150–400)
RBC: 4.9 MIL/uL (ref 4.22–5.81)
RDW: 15.1 % (ref 11.5–15.5)
WBC: 11.2 10*3/uL — ABNORMAL HIGH (ref 4.0–10.5)

## 2013-05-26 LAB — CG4 I-STAT (LACTIC ACID): Lactic Acid, Venous: 1.87 mmol/L (ref 0.5–2.2)

## 2013-05-26 MED ORDER — MIRTAZAPINE 15 MG PO TABS
15.0000 mg | ORAL_TABLET | Freq: Every day | ORAL | Status: DC
  Administered 2013-05-26 – 2013-05-27 (×2): 15 mg via ORAL
  Filled 2013-05-26 (×3): qty 1

## 2013-05-26 MED ORDER — VANCOMYCIN HCL IN DEXTROSE 1-5 GM/200ML-% IV SOLN
1000.0000 mg | Freq: Once | INTRAVENOUS | Status: AC
  Administered 2013-05-26: 1000 mg via INTRAVENOUS
  Filled 2013-05-26: qty 200

## 2013-05-26 MED ORDER — VITAMIN B-12 1000 MCG PO TABS
1000.0000 ug | ORAL_TABLET | Freq: Every day | ORAL | Status: DC
  Administered 2013-05-27 – 2013-05-28 (×2): 1000 ug via ORAL
  Filled 2013-05-26 (×2): qty 1

## 2013-05-26 MED ORDER — SODIUM CHLORIDE 0.9 % IV SOLN
INTRAVENOUS | Status: DC
  Administered 2013-05-26: via INTRAVENOUS

## 2013-05-26 MED ORDER — IBUPROFEN 400 MG PO TABS: 400.0000 mg | ORAL_TABLET | Freq: Four times a day (QID) | ORAL | Status: DC | PRN

## 2013-05-26 MED ORDER — HEPARIN SODIUM (PORCINE) 5000 UNIT/ML IJ SOLN
5000.0000 [IU] | Freq: Three times a day (TID) | INTRAMUSCULAR | Status: DC
  Administered 2013-05-26 – 2013-05-28 (×5): 5000 [IU] via SUBCUTANEOUS
  Filled 2013-05-26 (×6): qty 1

## 2013-05-26 MED ORDER — PIPERACILLIN-TAZOBACTAM 3.375 G IVPB
3.3750 g | Freq: Once | INTRAVENOUS | Status: AC
  Administered 2013-05-26: 3.375 g via INTRAVENOUS
  Filled 2013-05-26: qty 50

## 2013-05-26 MED ORDER — VITAMIN C 500 MG PO TABS
500.0000 mg | ORAL_TABLET | Freq: Every day | ORAL | Status: DC
  Administered 2013-05-27 – 2013-05-28 (×2): 500 mg via ORAL
  Filled 2013-05-26 (×2): qty 1

## 2013-05-26 MED ORDER — VANCOMYCIN HCL IN DEXTROSE 1-5 GM/200ML-% IV SOLN
1000.0000 mg | Freq: Two times a day (BID) | INTRAVENOUS | Status: DC
  Administered 2013-05-27: 1000 mg via INTRAVENOUS
  Filled 2013-05-26 (×2): qty 200

## 2013-05-26 MED ORDER — DEXTROSE 5 % IV SOLN
1.0000 g | Freq: Three times a day (TID) | INTRAVENOUS | Status: DC
  Administered 2013-05-26 – 2013-05-27 (×2): 1 g via INTRAVENOUS
  Filled 2013-05-26 (×4): qty 1

## 2013-05-26 MED ORDER — SODIUM CHLORIDE 0.9 % IV SOLN
INTRAVENOUS | Status: DC
  Administered 2013-05-26: 23:00:00 via INTRAVENOUS

## 2013-05-26 MED ORDER — VITAMIN D (ERGOCALCIFEROL) 1.25 MG (50000 UNIT) PO CAPS
50000.0000 [IU] | ORAL_CAPSULE | ORAL | Status: DC
  Administered 2013-05-27: 50000 [IU] via ORAL
  Filled 2013-05-26: qty 1

## 2013-05-26 MED ORDER — SODIUM CHLORIDE 0.9 % IV BOLUS (SEPSIS)
1000.0000 mL | Freq: Once | INTRAVENOUS | Status: AC
  Administered 2013-05-26: 1000 mL via INTRAVENOUS

## 2013-05-26 NOTE — ED Notes (Signed)
Spoke to charge nurse and f/u with patient and family and made them aware that all patient followed by family practice [facility]. So it is medically necessary for [facility]. Family was okay with the transferr.

## 2013-05-26 NOTE — ED Notes (Signed)
[id number]<AV> Expected date:05/26/13<BR> Expected time: 5:16 PM<BR> Means of arrival:Ambulance<BR> Comments:<BR> Cough and fever

## 2013-05-26 NOTE — Progress Notes (Signed)
ANTIBIOTIC CONSULT NOTE - INITIAL  Pharmacy Consult for Vancomycin Indication: pneumonia  No Known Allergies  Patient Measurements: Height: 5\' 8"  (172.7 cm) Weight: 127 lb 10.3 oz (57.9 kg) IBW/kg (Calculated) : 68.4  Vital Signs: Temp: 99 F (37.2 C) (07/26 2214) Temp src: Oral (07/26 2214) BP: 110/64 mmHg (07/26 2214) Pulse Rate: 85 (07/26 2214) Intake/Output from previous day:   Intake/Output from this shift: Total I/O In: 800 [I.V.:800] Out: -   Labs:  Recent Labs  05/26/13 1806  WBC 11.2*  HGB 15.4  PLT 199  CREATININE 0.49*   Estimated Creatinine Clearance: 92.5 ml/min (by C-G formula based on Cr of 0.49). No results found for this basename: VANCOTROUGH, VANCOPEAK, VANCORANDOM, GENTTROUGH, GENTPEAK, GENTRANDOM, TOBRATROUGH, TOBRAPEAK, TOBRARND, AMIKACINPEAK, AMIKACINTROU, AMIKACIN,  in the last 72 hours   Microbiology: No results found for this or any previous visit (from the past 720 hour(s)).  Medical History: Past Medical History  Diagnosis Date  . Myotonic muscular dystrophy   . Hypertension     Medications:  Prescriptions prior to admission  Medication Sig Dispense Refill  . hydrochlorothiazide (MICROZIDE) 12.5 MG capsule Take 12.5 mg by mouth every morning.      Marland Kitchen HYDROcodone-acetaminophen (NORCO/VICODIN) 5-325 MG per tablet Take 2 tablets by mouth every 4 (four) hours as needed for pain.      Marland Kitchen ibuprofen (ADVIL,MOTRIN) 200 MG tablet Take 400 mg by mouth every 6 (six) hours as needed for pain.      . mirtazapine (REMERON) 15 MG tablet Take 15 mg by mouth at bedtime.      . vitamin B-12 (CYANOCOBALAMIN) 1000 MCG tablet Take 1,000 mcg by mouth daily.      . vitamin C (ASCORBIC ACID) 500 MG tablet Take 500 mg by mouth daily.      . Vitamin D, Ergocalciferol, (DRISDOL) 50000 UNITS CAPS Take 50,000 Units by mouth every Sunday.       Assessment: 47 yo male with fevers/PNA for empiric antibiotics.  Vancomycin 1 g IV given in ED at  1900  Goal of  Therapy:  Vancomycin trough 15-20  Plan:  Vancomycin 1 g IV q12h  Eddie Candle 05/26/2013,11:01 PM

## 2013-05-26 NOTE — ED Notes (Signed)
Per EMS, nursing facility wants patient evaluated for cough X 4 days-fever-states he has felt bad since eating at [facility]

## 2013-05-26 NOTE — Discharge Instructions (Signed)
Transfer to [name] for admission - page the [facility] resident on call on pts arrival to room.

## 2013-05-26 NOTE — H&P (Signed)
Family Medicine [facility] Admission History and Physical Service Pager: [phone]  Patient name: Malik [name] Medical record number: [id number] Date of birth: 09-24-1966 Age: 47 y.o. Gender: male  Primary Care Provider: Simone Curia, MD Consultants: None  Code Status: DNR   Chief Complaint: SOB/Productive Cough   Assessment and Plan: Malik [name] is a 47 y.o. year old male presenting with LUL PNA . PMH is significant for Myotonic Muscular Dystrophy requiring   #Severe Sepsis Secondary to HCAP - Pt w/ LUL PNA, leukocytosis, tachycardic, hypotensive and coming from nursing home.   -Will admit to telemetry, as pt responded well to fluid bolus.  Monitor closely as would have low threshold to transfer  -Will get BCx, although received one dose of ABx as expecting these to be negative. -Repeat daily CBCAD -Sputum Cx, Urine S. Pneumo/legionella, HIV Antibody -O2 PRN > 92%, vitals per unit, continuous pulse ox -Will tx for HCAP with Cefepime/Vancomycin and narrow ABx pending clinical and laboratory improvement  #Hx of Myotonic muscular dystrophy - Now requiring 24 hr supervision at [facility] -Managed by [facility] -Continue medications   #Vitamin B 12 deficiency  -Continue 100 mcg qd  #HTN  -Holding home HCTZ pending BP improvement   #Transaminitis w/ Elevated Bilirubin -Unsure of etiology, will hold hepatic toxins -Repeat CMET in AM   FEN/GI: Regular Diet, NS @ 125 cc/hr Prophylaxis: SQ heparin   Disposition: Pending clinical improvement.  CSW consult to help with transfer of pt back to [facility] when medically ready.   History of Present Illness: Malik [name] is a 47 y.o. year old male presenting with fever, chills, and productive cough x 2 days.  Pt states he was in his normal state of health up until two nights ago.  He ate dinner and went to bed, waking up around 4 AM to cough.  Pt started to have  generalized myalgias and chills/sweats throughout the day with productive brown sputum.  This progressed until this AM when he had his temperature taken and was 101.  At this point, on call family physician was called and recommended evaluation in the ED.   In the ED pt had multiple evaluations performed inclduing CBC w/ leukocytosis to 11.2, CXR w/ LUL PNA, CMET nml, and borderline hypotensive/tacycardic.  He was given a 1 L NS bolus and started on Vancomycin/Zosyn x 1 dose along with [place] O2 keep saturations > 92%.    Pt denies nausea, vomiting, diarrhea, abdominal pain, chest pain, HA, blurred vision, dyspepsia, tinnitus, sore throat, neck stiffness, dizziness, leg edema.   Pt currently lives in [facility] due to progressive myotonic muscular dystrophy.  He requires 24 hr care and help with his ADL's.  He smokes cigarettes when he can obtain them, denies alcohol use, and occasional marijuana use but no IV drug abuse.   Review Of Systems: Per HPI   Patient Active Problem List   Diagnosis Date Noted  . Bilateral ankle fractures 01/10/2013  . 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 Hypertension, benign 01/20/2012  . Vitamin D deficiency 08/05/2011  . Dizziness 07/30/2011  . Vitamin B 12 deficiency 07/14/2011  . Myotonic muscular dystrophy 05/18/2011  . Muscle weakness (generalized) 04/16/2011  . Weight loss, abnormal 04/16/2011  . Tobacco user 04/16/2011   Past Medical History: Past Medical History  Diagnosis Date  . Myotonic muscular dystrophy   . Hypertension    Past Surgical History: Past Surgical History  Procedure Laterality Date  . Multiple tooth extractions  2007  Pt had all teeth pulled, has not yet gotten dentures.   . Cholecystectomy     Social History: History  Substance Use Topics  . Smoking status: Current Some Day Smoker -- 0.30 packs/day    Types: Cigarettes  . Smokeless tobacco: Never Used     Comment: Pt states that he has not bought any since mid  september, but will smoke if he finds one  . Alcohol Use: No   Family History: Family History  Problem Relation Age of Onset  . COPD Mother   . Diabetes Father    Allergies and Medications: No Known Allergies No current facility-administered medications on file prior to encounter.   Current Outpatient Prescriptions on File Prior to Encounter  Medication Sig Dispense Refill  . hydrochlorothiazide (MICROZIDE) 12.5 MG capsule Take 12.5 mg by mouth every morning.      Marland Kitchen HYDROcodone-acetaminophen (NORCO/VICODIN) 5-325 MG per tablet Take 2 tablets by mouth every 4 (four) hours as needed for pain.      Marland Kitchen ibuprofen (ADVIL,MOTRIN) 200 MG tablet Take 400 mg by mouth every 6 (six) hours as needed for pain.      . mirtazapine (REMERON) 15 MG tablet Take 15 mg by mouth at bedtime.      . vitamin B-12 (CYANOCOBALAMIN) 1000 MCG tablet Take 1,000 mcg by mouth daily.      . vitamin C (ASCORBIC ACID) 500 MG tablet Take 500 mg by mouth daily.      . Vitamin D, Ergocalciferol, (DRISDOL) 50000 UNITS CAPS Take 50,000 Units by mouth every Sunday.        Objective: BP 95/65  Pulse 73  Temp(Src) 98.6 F (37 C) (Oral)  Resp 20  SpO2 93% Exam: General: NAD, in bed, no increased WOB, cachectic  HEENT: [place]/AT, temporalis wasting B/L, no scleral icterus, yellow tinged mustache Neck: no neck stiffness, no LAD  Cardiovascular: RRR, no murmurs appreciated  Respiratory: Rhonchi LUL/LLL, Bibasilar crackles, no increased WOB, no accessory muscle use Abdomen: Soft/NT/ND, no CVA tenderness, no HSM  Extremities: WWP, no prolonged grip relaxation  Skin: no rashes/ulcerations Neuro: no focal deficits   Labs and Imaging: CBC BMET   Recent Labs Lab 05/26/13 1806  WBC 11.2*  HGB 15.4  HCT 44.8  PLT 199    CXR - LUL PNA  Recent Labs Lab 05/26/13 1806  NA 135  K 3.7  CL 95*  CO2 26  BUN 14  CREATININE 0.49*  GLUCOSE 111*  CALCIUM 9.9       Briscoe Deutscher, DO 05/26/2013, 8:28 PM PGY-2, [facility] FPTS Intern pager: [phone], text pages welcome

## 2013-05-26 NOTE — Telephone Encounter (Signed)
RN from [facility] called afterhours line.  Pt has been having fever of 101.7, productive brown phlegm, and sore throat with some chills that all started today.  Since clinic is not open, will send to ED for evaluation of PNA.    Twana First Paulina Fusi, DO of [facility] 05/26/2013, 4:27 PM

## 2013-05-26 NOTE — ED Notes (Signed)
Patient and friend/family at bedside are concerned about why they have to transferr to [facility]. Will f/u with attending.

## 2013-05-26 NOTE — ED Provider Notes (Addendum)
CSN: [id number]     Arrival date & time 05/26/13  1724 History     First MD Initiated Contact with Patient 05/26/13 1735     Chief Complaint  Patient presents with  . Cough   (Consider location/radiation/quality/duration/timing/severity/associated sxs/prior Treatment) Patient is a 47 y.o. male presenting with cough. The history is provided by the patient and the EMS personnel.  Cough Associated symptoms: fever   Associated symptoms: no chest pain, no chills, no headaches, no rash, no shortness of breath and no sore throat   pt with hx myotonic muscular dystrophy, c/o productive cough, fever, for the past few days. Cough episodic. Moderate. No specific exacerbating or alleviating factors. Denies sob or chest pain. No sore throat, runny nose, or other uri c/o. No known ill contacts. Denies hx chronic lung disease.  +smoker.   Past Medical History  Diagnosis Date  . Myotonic muscular dystrophy   . Hypertension    Past Surgical History  Procedure Laterality Date  . Multiple tooth extractions  2007    Pt had all teeth pulled, has not yet gotten dentures.   . Cholecystectomy     Family History  Problem Relation Age of Onset  . COPD Mother   . Diabetes Father    History  Substance Use Topics  . Smoking status: Current Some Day Smoker -- 0.30 packs/day    Types: Cigarettes  . Smokeless tobacco: Never Used     Comment: Pt states that he has not bought any since mid september, but will smoke if he finds one  . Alcohol Use: No    Review of Systems  Constitutional: Positive for fever. Negative for chills.  HENT: Negative for sore throat, neck pain and neck stiffness.   Eyes: Negative for redness.  Respiratory: Positive for cough. Negative for shortness of breath.   Cardiovascular: Negative for chest pain.  Gastrointestinal: Negative for vomiting, abdominal pain and diarrhea.  Genitourinary: Negative for flank pain.  Musculoskeletal: Negative for back pain.  Skin: Negative for  rash.  Neurological: Negative for headaches.  Hematological: Does not bruise/bleed easily.  Psychiatric/Behavioral: Negative for confusion.    Allergies  Review of patient's allergies indicates no known allergies.  Home Medications   Current Outpatient Rx  Name  Route  Sig  Dispense  Refill  . hydrochlorothiazide (MICROZIDE) 12.5 MG capsule   Oral   Take 12.5 mg by mouth every morning.         Marland Kitchen HYDROcodone-acetaminophen (NORCO/VICODIN) 5-325 MG per tablet   Oral   Take 2 tablets by mouth every 4 (four) hours as needed for pain.         Marland Kitchen ibuprofen (ADVIL,MOTRIN) 200 MG tablet   Oral   Take 400 mg by mouth every 6 (six) hours as needed for pain.         . mirtazapine (REMERON) 15 MG tablet   Oral   Take 15 mg by mouth at bedtime.         . vitamin B-12 (CYANOCOBALAMIN) 1000 MCG tablet   Oral   Take 1,000 mcg by mouth daily.         . vitamin C (ASCORBIC ACID) 500 MG tablet   Oral   Take 500 mg by mouth daily.         . Vitamin D, Ergocalciferol, (DRISDOL) 50000 UNITS CAPS   Oral   Take 50,000 Units by mouth every Sunday.          BP 95/65  Pulse 73  Temp(Src) 98.6 F (37 C) (Oral)  Resp 20  SpO2 93% Physical Exam  Nursing note and vitals reviewed. Constitutional: He is oriented to person, place, and time. He appears well-developed and well-nourished. No distress.  HENT:  Head: Atraumatic.  Mouth/Throat: Oropharynx is clear and moist.  Eyes: Conjunctivae are normal.  Neck: Normal range of motion. Neck supple. No tracheal deviation present.  No stiffness or rigidity  Cardiovascular: Normal rate, regular rhythm, normal heart sounds and intact distal pulses.   No murmur heard. Pulmonary/Chest: Effort normal. No accessory muscle usage. No respiratory distress.  Rhonchi left  Abdominal: Soft. Bowel sounds are normal. He exhibits no distension and no mass. There is no tenderness. There is no rebound and no guarding.  Genitourinary:  No cva  tenderness  Musculoskeletal: Normal range of motion. He exhibits no edema and no tenderness.  Neurological: He is alert and oriented to person, place, and time.  Skin: Skin is warm and dry.  Psychiatric: He has a normal mood and affect.    ED Course   Procedures (including critical care time)  Results for orders placed during the hospital encounter of 05/26/13  CBC      Result Value Range   WBC 11.2 (*) 4.0 - 10.5 K/uL   RBC 4.90  4.22 - 5.81 MIL/uL   Hemoglobin 15.4  13.0 - 17.0 g/dL   HCT 54.0  98.1 - 19.1 %   MCV 91.4  78.0 - 100.0 fL   MCH 31.4  26.0 - 34.0 pg   MCHC 34.4  30.0 - 36.0 g/dL   RDW 47.8  29.5 - 62.1 %   Platelets 199  150 - 400 K/uL  COMPREHENSIVE METABOLIC PANEL      Result Value Range   Sodium 135  135 - 145 mEq/L   Potassium 3.7  3.5 - 5.1 mEq/L   Chloride 95 (*) 96 - 112 mEq/L   CO2 26  19 - 32 mEq/L   Glucose, Bld 111 (*) 70 - 99 mg/dL   BUN 14  6 - 23 mg/dL   Creatinine, Ser 3.08 (*) 0.50 - 1.35 mg/dL   Calcium 9.9  8.4 - 65.7 mg/dL   Total Protein 7.3  6.0 - 8.3 g/dL   Albumin 3.2 (*) 3.5 - 5.2 g/dL   AST 52 (*) 0 - 37 U/L   ALT 63 (*) 0 - 53 U/L   Alkaline Phosphatase 109  39 - 117 U/L   Total Bilirubin 1.8 (*) 0.3 - 1.2 mg/dL   GFR calc non Af Amer >90  >90 mL/min   GFR calc Af Amer >90  >90 mL/min  CG4 I-STAT (LACTIC ACID)      Result Value Range   Lactic Acid, Venous 1.87  0.5 - 2.2 mmol/L   Dg Chest 2 View  05/26/2013   *RADIOLOGY REPORT*  Clinical Data: Weakness with shortness of breath and cold sweats for 2 days.  History of hypertension and smoking.  CHEST - 2 VIEW  Comparison: 04/19/2011.  Findings: There is patchy perihilar and lower lobe air space disease on the left consistent with early pneumonia.  The right lung is clear.  There is no pleural effusion.  The heart size and mediastinal contours are normal.  IMPRESSION: Patchy left lung air space opacities consistent with pneumonia. This should be followed radiographic clearing.    Original Report Authenticated By: Carey Bullocks, M.D.      MDM  Cxr, labs. On room air pulse ox 87%, 95% on  2 liters [place].  Reviewed nursing notes and prior charts for additional history.   Iv ns bolus.  cxr c/w pna. Pt at [facility] therefore will rx for hcap.  Zosyn and vanc iv.  Recheck pt, no change in condition from prior x bp improved.  pcp is [facility]  - discussed w roc who accepts in transfer to [facility].     Suzi Roots, MD 05/26/13 1902  Suzi Roots, MD 05/26/13 [phone]

## 2013-05-27 DIAGNOSIS — F17200 Nicotine dependence, unspecified, uncomplicated: Secondary | ICD-10-CM

## 2013-05-27 DIAGNOSIS — M6281 Muscle weakness (generalized): Secondary | ICD-10-CM

## 2013-05-27 LAB — COMPREHENSIVE METABOLIC PANEL
ALT: 39 U/L (ref 0–53)
AST: 25 U/L (ref 0–37)
Albumin: 2.6 g/dL — ABNORMAL LOW (ref 3.5–5.2)
Alkaline Phosphatase: 113 U/L (ref 39–117)
BUN: 11 mg/dL (ref 6–23)
CO2: 27 mEq/L (ref 19–32)
Calcium: 8.8 mg/dL (ref 8.4–10.5)
Chloride: 101 mEq/L (ref 96–112)
Creatinine, Ser: 0.48 mg/dL — ABNORMAL LOW (ref 0.50–1.35)
GFR calc Af Amer: >90 mL/min (ref >90)
GFR calc non Af Amer: >90 mL/min (ref >90)
Glucose, Bld: 107 mg/dL — ABNORMAL HIGH (ref 70–99)
Potassium: 2.7 mEq/L — CL (ref 3.5–5.1)
Sodium: 138 mEq/L (ref 135–145)
Total Bilirubin: 1.6 mg/dL — ABNORMAL HIGH (ref 0.3–1.2)
Total Protein: 5.7 g/dL — ABNORMAL LOW (ref 6.0–8.3)

## 2013-05-27 LAB — CBC WITH DIFFERENTIAL/PLATELET
Basophils Absolute: 0 10*3/uL (ref 0.0–0.1)
Basophils Relative: 0 % (ref 0–1)
Eosinophils Absolute: 0 10*3/uL (ref 0.0–0.7)
Eosinophils Relative: 0 % (ref 0–5)
HCT: 36.5 % — ABNORMAL LOW (ref 39.0–52.0)
Hemoglobin: 12.7 g/dL — ABNORMAL LOW (ref 13.0–17.0)
Lymphocytes Relative: 15 % (ref 12–46)
Lymphs Abs: 1.3 10*3/uL (ref 0.7–4.0)
MCH: 31.6 pg (ref 26.0–34.0)
MCHC: 34.8 g/dL (ref 30.0–36.0)
MCV: 90.8 fL (ref 78.0–100.0)
Monocytes Absolute: 0.5 10*3/uL (ref 0.1–1.0)
Monocytes Relative: 6 % (ref 3–12)
Neutro Abs: 7.1 10*3/uL (ref 1.7–7.7)
Neutrophils Relative %: 79 % — ABNORMAL HIGH (ref 43–77)
Platelets: 193 10*3/uL (ref 150–400)
RBC: 4.02 MIL/uL — ABNORMAL LOW (ref 4.22–5.81)
RDW: 15.2 % (ref 11.5–15.5)
WBC: 9 10*3/uL (ref 4.0–10.5)

## 2013-05-27 LAB — CBC
HCT: 38 % — ABNORMAL LOW (ref 39.0–52.0)
Hemoglobin: 12.7 g/dL — ABNORMAL LOW (ref 13.0–17.0)
MCH: 30.6 pg (ref 26.0–34.0)
MCHC: 33.4 g/dL (ref 30.0–36.0)
MCV: 91.6 fL (ref 78.0–100.0)
Platelets: 186 10*3/uL (ref 150–400)
RBC: 4.15 MIL/uL — ABNORMAL LOW (ref 4.22–5.81)
RDW: 15.3 % (ref 11.5–15.5)
WBC: 6.4 10*3/uL (ref 4.0–10.5)

## 2013-05-27 LAB — CREATININE, SERUM
Creatinine, Ser: 0.56 mg/dL (ref 0.50–1.35)
GFR calc Af Amer: >90 mL/min (ref >90)
GFR calc non Af Amer: >90 mL/min (ref >90)

## 2013-05-27 LAB — HIV ANTIBODY (ROUTINE TESTING W REFLEX): HIV: NONREACTIVE

## 2013-05-27 LAB — STREP PNEUMONIAE URINARY ANTIGEN: Strep Pneumo Urinary Antigen: NEGATIVE

## 2013-05-27 LAB — MAGNESIUM: Magnesium: 2.1 mg/dL (ref 1.5–2.5)

## 2013-05-27 LAB — TSH: TSH: 0.667 u[IU]/mL (ref 0.350–4.500)

## 2013-05-27 LAB — CORTISOL: Cortisol, Plasma: 10.7 ug/dL

## 2013-05-27 MED ORDER — POTASSIUM CHLORIDE CRYS ER 20 MEQ PO TBCR
40.0000 meq | EXTENDED_RELEASE_TABLET | Freq: Two times a day (BID) | ORAL | Status: AC
  Administered 2013-05-27 – 2013-05-28 (×3): 40 meq via ORAL
  Filled 2013-05-27: qty 1
  Filled 2013-05-27 (×3): qty 2

## 2013-05-27 MED ORDER — LEVOFLOXACIN 750 MG PO TABS
750.0000 mg | ORAL_TABLET | Freq: Every day | ORAL | Status: DC
  Administered 2013-05-27 – 2013-05-28 (×2): 750 mg via ORAL
  Filled 2013-05-27 (×2): qty 1

## 2013-05-27 MED ORDER — PANTOPRAZOLE SODIUM 40 MG PO TBEC
40.0000 mg | DELAYED_RELEASE_TABLET | Freq: Every day | ORAL | Status: DC
  Administered 2013-05-27 – 2013-05-28 (×2): 40 mg via ORAL
  Filled 2013-05-27 (×2): qty 1

## 2013-05-27 NOTE — Progress Notes (Signed)
Seen and examined.  Discussed with Dr. Berline Chough.  See my co-sign of the H&PE for my note of today.

## 2013-05-27 NOTE — H&P (Signed)
Seen and examined.  Discussed with Dr. Paulina Fusi.  Agree with his management and documentation.  Briefly, 47 yo male with muscular dystrophy presents with fever, abnormal CXR and new O2 requirement.  Issues: 1. Pneumonia HCAP.  CXR is patchy so we should also cover for atypicals.  Feels much better today so plan to switch to PO levoquin.  No O2 requirement at present.  Denies trouble swallowing. 2. Protein Calorie malnutrition, moderate.  States eating OK.  Serum proteins are falling.   3.  Myotonic Muscular Dystrophy, longstanding and progressive.  No acute issues. 4. Dispo - plans to go back to [facility], but would like to switch roommate who he states he cannot trust.

## 2013-05-27 NOTE — Progress Notes (Signed)
Family Medicine Teaching Service Daily Progress Note Intern Pager: [phone]  Patient name: Malik [name] Medical record number: [id number] Date of birth: 1966/05/12 Age: 47 y.o. Gender: male  Primary Care Provider: Simone Curia, MD Consultants: None Code Status: DNR  Pt Overview and Major Events to Date:  7/26 - Severe Sepsis Criteria resolved with fluids 7/27 - Swallow Eval - ?reflux, regular diet, thin liquids with straw (ground meats)  Assessment and Plan: 47 yo male with Left Sided PNA.  PMH significant for Myotonic Muscular Dystrophy  # Severe Sepsis Secondary to HCAP - Pt w/ left sided diffuse PNA, leukocytosis, tachycardia, hypotensive but responded to fluid bolus.   - respiratory status is improved - BP soft, will provide additional bolus, check cortisol, check TSH - change to Levaquin for atypical appearance on CXR - Lactate previously normal - HIV, Blood Cx, Legionella pending - Strep Pneumo urine negative - maintaining O2 at rest > 94% - Repeat CXR in AM for clearing  # Protein Calorie Malnutrition and Transaminitis - likely nutritionally related as well as ? Mild shock liver. 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  Consider reflux - START PPI   FEN/GI: Regular Diet with thin liquids, ground meat; NS @ 167ml/hr PPx: Heparin SQ, start PPI  Disposition: To [facility] to [facility].  SW consulted.  Pt requesting new bed  placement  Subjective: Reports feeling better overall. Singultus bothering him worse  Objective: Temp:  [98.5 F (36.9 C)-99.2 F (37.3 C)] 99.2 F (37.3 C) (07/27 0554) Pulse Rate:  [73-85] 78 (07/27 0554) Resp:  [16-20] 18 (07/27 0554) BP: (89-110)/(56-65) 89/56 mmHg (07/27 0554) SpO2:  [93 %-95 %] 95 % (07/27 0554) FiO2 (%):  [2 %] 2 % (07/26 2214) Weight:  [127 lb 10.3 oz (57.9 kg)] 127 lb 10.3 oz (57.9 kg) (07/26 2214) Physical Exam: General: NAD, resting, no WOB, cachectic Cardiovascular: RRR, S1/S2 heard, no murmur Respiratory: crackles diffusely in left lobe, good airmovement Abdomen: +BS, soft, non-tender Extremities: warm, well perfused, slow to move with generalized weakness  Laboratory:  Recent Labs Lab 05/26/13 05/26/13 1806 05/27/13 0615  WBC 6.4 11.2* 9.0  HGB 12.7* 15.4 12.7*  HCT 38.0* 44.8 36.5*  PLT 186 199 193    Recent Labs Lab 05/26/13 05/26/13 1806 05/27/13 0615  NA  --  135 138  K  --  3.7 2.7*  CL  --  95* 101  CO2  --  26 27  BUN  --  14 11  CREATININE 0.56 0.49* 0.48*  CALCIUM  --  9.9 8.8  PROT  --  7.3 5.7*  BILITOT  --  1.8* 1.6*  ALKPHOS  --  109 113  ALT  --  63* 39  AST  --  52* 25  GLUCOSE  --  111* 107*     05/26/2013 18:24  Lactic Acid, Venous 1.87    Imaging/Diagnostic Tests: 7/26 - CXR - Patchy left lung air space opacities consistent with  pneumonia.  This should be followed radiographic clearing.   Andrena Mews, DO 05/27/2013, 10:05 AM PGY-3, [facility] FPTS Intern pager: [phone], text pages welcome

## 2013-05-27 NOTE — Evaluation (Addendum)
Clinical/Bedside Swallow Evaluation Patient Details  Name: Malik [name] MRN: [id number] Date of Birth: [birth year]  Today's Date: 05/27/2013 Time: 5621-3086 SLP Time Calculation (min): 16 min  Past Medical History:  Past Medical History  Diagnosis Date  . Myotonic muscular dystrophy   . Hypertension    Past Surgical History:  Past Surgical History  Procedure Laterality Date  . Multiple tooth extractions  2007    Pt had all teeth pulled, has not yet gotten dentures.   . Cholecystectomy     HPI:  47 yo male adm to hospital with fever, cough, chills x2 days.  Pt has myotonic muscular dystrophy and receives 24/7 care.  Pt diagnosed with LUL pna with leukocytosis, tachycardiac, hypotension.  Pt reports eating at 9 pm (cheeseburger) and 1030 pm (nabs) and then waking with hiccups, he denies coughing or choking while hiccuping.  Pt does admit to occasional reflux/heartburn symptoms.  He admits to occasional sensation of difficulty swallowing saliva and food crumbs lodging in throat that effectively clear with liquid swallows. Pt states his weight has been stable and he does have dentures at his faciility.     Assessment / Plan / Recommendation Clinical Impression  Pt presents with functional oropharyngeal swallow based on clinical swallowing evaluation.  No s/s of aspiration noted with minimal intake pt accepted *pt already ate breakfast.  Pt is aware of need to use caution with po intake and appears to compensate well.  Pt denies significant dysphagia but does admit to occasional heartburn and hiccups x 2-3 days once a month for years.    Rec ground meats d/t edentulous status *pt to have someone bring his dentures.   Pt's symptoms appear consistent with esophageal/reflux issues.  SLP to follow up x1 for tolerance, instructed pt to monitor tolerance closely today to provide detailed information re: swallow function.  Thanks for this consult.      Aspiration Risk  Mild    Diet  Recommendation Regular;Thin liquid (ground meats)   Liquid Administration via: Straw;Cup Medication Administration: Whole meds with liquid (as tolerated) Supervision: Staff feed patient Compensations: Slow rate;Small sips/bites Postural Changes and/or Swallow Maneuvers: Upright 30-60 min after meal;Seated upright 90 degrees    Other  Recommendations Oral Care Recommendations: Oral care BID   Follow Up Recommendations       Frequency and Duration min 1 x/week  1 week   Pertinent Vitals/Pain Febrile, decreased    SLP Swallow Goals Patient will utilize recommended strategies during swallow to increase swallowing safety with: Independent assistance   Swallow Study Prior Functional Status   regular diet at residence    General Date of Onset: 05/27/13 HPI: 47 yo male adm to hospital with fever, cough, chills x2 days.  Pt has myotonic muscular dystrophy and receives 24/7 care.  Pt diagnosed with LUL pna with leukocytosis, tachycardiac, hypotension.  Pt reports eating at 9 pm (cheeseburger) and 1030 pm (nabs) and then waking with hiccups.  Pt does admit to occasional reflux/heartburn symptoms.  He admits to occasional sensation of difficulty swallowing saliva and food crumbs lodging in throat that effectively clear with liquid swallows. Pt states his weight has been stable and he does have dentures at his faciility.   Type of Study: Bedside swallow evaluation Previous Swallow Assessment: none Diet Prior to this Study: Regular;Thin liquids Temperature Spikes Noted: No Respiratory Status: Room air History of Recent Intubation: No Behavior/Cognition: Alert;Cooperative;Pleasant mood Oral Cavity - Dentition: Dentures, not available;Edentulous (at facility) Patient Positioning: Upright in bed Baseline Vocal Quality:  Clear Volitional Cough: Weak Volitional Swallow: Able to elicit (with delay)    Oral/Motor/Sensory Function Overall Oral Motor/Sensory Function: Appears within functional  limits for tasks assessed (generalized weakness, no focal deficits, dysarthric low voic)   Ice Chips     Thin Liquid Thin Liquid: Within functional limits Presentation: Straw    Nectar Thick Nectar Thick Liquid: Not tested   Honey Thick Honey Thick Liquid: Not tested   Puree Puree: Not tested   Solid   GO    Solid: Within functional limits Other Comments: pt allowed graham cracker to melt secondary to lack of dentition       Donavan Burnet, MS [facility] SLP [phone]

## 2013-05-27 NOTE — Progress Notes (Signed)
CRITICAL VALUE ALERT  Critical value received:  K+ 2.7  Date of notification:  7/27  Time of notification:  0809  Critical value read back:yes  Nurse who received alert:  B. Noelle Penner RN  MD notified (1st page):  Hensel MD  Time of first page:  [phone]  MD notified (2nd page): On-Call  Time of second page: 0828  Responding MD:  Leveda Anna MD  Time MD responded:  0830

## 2013-05-28 DIAGNOSIS — R42 Dizziness and giddiness: Secondary | ICD-10-CM

## 2013-05-28 DIAGNOSIS — E538 Deficiency of other specified B group vitamins: Secondary | ICD-10-CM

## 2013-05-28 LAB — LEGIONELLA ANTIGEN, URINE: Legionella Antigen, Urine: NEGATIVE

## 2013-05-28 LAB — COMPREHENSIVE METABOLIC PANEL
ALT: 28 U/L (ref 0–53)
AST: 24 U/L (ref 0–37)
Albumin: 2.3 g/dL — ABNORMAL LOW (ref 3.5–5.2)
Alkaline Phosphatase: 86 U/L (ref 39–117)
BUN: 7 mg/dL (ref 6–23)
CO2: 24 mEq/L (ref 19–32)
Calcium: 8.6 mg/dL (ref 8.4–10.5)
Chloride: 111 mEq/L (ref 96–112)
Creatinine, Ser: 0.51 mg/dL (ref 0.50–1.35)
GFR calc Af Amer: >90 mL/min (ref >90)
GFR calc non Af Amer: >90 mL/min (ref >90)
Glucose, Bld: 100 mg/dL — ABNORMAL HIGH (ref 70–99)
Potassium: 4 mEq/L (ref 3.5–5.1)
Sodium: 143 mEq/L (ref 135–145)
Total Bilirubin: 0.8 mg/dL (ref 0.3–1.2)
Total Protein: 5.5 g/dL — ABNORMAL LOW (ref 6.0–8.3)

## 2013-05-28 LAB — CBC WITH DIFFERENTIAL/PLATELET
Basophils Absolute: 0 10*3/uL (ref 0.0–0.1)
Basophils Relative: 0 % (ref 0–1)
Eosinophils Absolute: 0 10*3/uL (ref 0.0–0.7)
Eosinophils Relative: 0 % (ref 0–5)
HCT: 34.2 % — ABNORMAL LOW (ref 39.0–52.0)
Hemoglobin: 11.8 g/dL — ABNORMAL LOW (ref 13.0–17.0)
Lymphocytes Relative: 16 % (ref 12–46)
Lymphs Abs: 1.4 10*3/uL (ref 0.7–4.0)
MCH: 31.5 pg (ref 26.0–34.0)
MCHC: 34.5 g/dL (ref 30.0–36.0)
MCV: 91.2 fL (ref 78.0–100.0)
Monocytes Absolute: 0.5 10*3/uL (ref 0.1–1.0)
Monocytes Relative: 6 % (ref 3–12)
Neutro Abs: 6.8 10*3/uL (ref 1.7–7.7)
Neutrophils Relative %: 78 % — ABNORMAL HIGH (ref 43–77)
Platelets: 185 10*3/uL (ref 150–400)
RBC: 3.75 MIL/uL — ABNORMAL LOW (ref 4.22–5.81)
RDW: 15.3 % (ref 11.5–15.5)
WBC: 8.8 10*3/uL (ref 4.0–10.5)

## 2013-05-28 LAB — EXPECTORATED SPUTUM ASSESSMENT W GRAM STAIN, RFLX TO RESP C

## 2013-05-28 LAB — EXPECTORATED SPUTUM ASSESSMENT W REFEX TO RESP CULTURE

## 2013-05-28 MED ORDER — LEVOFLOXACIN 750 MG PO TABS: 750.0000 mg | ORAL_TABLET | Freq: Every day | ORAL | Status: AC

## 2013-05-28 MED ORDER — PANTOPRAZOLE SODIUM 40 MG PO TBEC: 40.0000 mg | DELAYED_RELEASE_TABLET | Freq: Every day | ORAL | Status: DC

## 2013-05-28 MED ORDER — LEVOFLOXACIN 750 MG PO TABS: 750.0000 mg | ORAL_TABLET | Freq: Every day | ORAL | Status: DC

## 2013-05-28 NOTE — Discharge Summary (Signed)
Please see cosign of today's progress note for additional details.  Patient seen and examined by me today.  Paula Compton, MD

## 2013-05-28 NOTE — Evaluation (Signed)
Physical Therapy Evaluation Patient Details Name: Malik [name] MRN: [id number] DOB: 05-Jan-1966 Today's Date: 05/28/2013 Time: 2130-8657 PT Time Calculation (min): 12 min  PT Assessment / Plan / Recommendation History of Present Illness  adm to hospital due to severe sepsis secondary to HCAP ; pt with MD   Clinical Impression  Pt evaluated to assess current functional status due to recent hospitalization from sepsis due to HCAP. Pt has been wheelchair bound and has been living at ALF; where he is independent for lateral transfers and mobility with wheelchair. Pt demo ability to verbalize instructions and setup safety of chair. Pt denies need for PT needs; feels as though he is functioning at baseline. Will sign off at this time.    PT Assessment  Patent does not need any further PT services    Follow Up Recommendations  No PT follow up    Does the patient have the potential to tolerate intense rehabilitation      Barriers to Discharge        Equipment Recommendations  None recommended by PT    Recommendations for Other Services     Frequency      Precautions / Restrictions Precautions Precautions: Fall Restrictions Weight Bearing Restrictions: No   Pertinent Vitals/Pain No c/o pain.       Mobility  Bed Mobility Bed Mobility: Supine to Sit;Sitting - Scoot to Edge of Bed Supine to Sit: 6: Modified independent (Device/Increase time);HOB elevated Sitting - Scoot to Edge of Bed: 6: Modified independent (Device/Increase time) Details for Bed Mobility Assistance: pt demo good technique; relies on handrails  Transfers Transfers: Lateral/Scoot Transfers Lateral/Scoot Transfers: 4: Min assist;From elevated surface Details for Transfer Assistance: pt required min (A) due to recliner chair not having drop arm; pt had difficulty clearing armrest of chair; pt demo ability to verbalize instruction on proper chair setup and safety; pt asked PT "are the wheels of the chair locked"   Ambulation/Gait Ambulation/Gait Assistance: Not tested (comment) (pt non-ambulatory ) Stairs: No Wheelchair Mobility Wheelchair Mobility: No         PT Diagnosis:    PT Problem List:   PT Treatment Interventions:       PT Goals(Current goals can be found in the care plan section) Acute Rehab PT Goals Patient Stated Goal: to get out of here and go back to my retirement center  PT Goal Formulation: No goals set, d/c therapy  Visit Information  Last PT Received On: 05/28/13 Assistance Needed: +1 History of Present Illness: adm to hospital due to severe sepsis secondary to HCAP ; pt with MD        Prior Functioning  Home Living Family/patient expects to be discharged to:: Assisted living Home Equipment: Wheelchair - manual Prior Function Level of Independence: Needs assistance Gait / Transfers Assistance Needed: wheelchair bound Comments: pt reports he is independent with all ADLs with wheelchair transfers only; requires setup (A) in bathroom and has meals prepped by kitchen at ALF Communication Communication: No difficulties    Cognition  Cognition Arousal/Alertness: Awake/alert Behavior During Therapy: Impulsive Overall Cognitive Status: Within Functional Limits for tasks assessed    Extremity/Trunk Assessment Upper Extremity Assessment Upper Extremity Assessment: Overall WFL for tasks assessed Lower Extremity Assessment Lower Extremity Assessment: Generalized weakness Cervical / Trunk Assessment Cervical / Trunk Assessment: Kyphotic   Balance Balance Balance Assessed: Yes Static Sitting Balance Static Sitting - Balance Support: Bilateral upper extremity supported;Feet unsupported Static Sitting - Level of Assistance: 7: Independent  End of Session PT -  End of Session Activity Tolerance: Patient tolerated treatment well Patient left: in chair;with call bell/phone within reach Nurse Communication: Mobility status  GP     Donell Sievert,  [place] [phone] 05/28/2013, 1:49 PM

## 2013-05-28 NOTE — Clinical Social Work Note (Signed)
Clinical Social Work Department BRIEF PSYCHOSOCIAL ASSESSMENT 05/28/2013  Patient:  Malik [name], Malik [name]     Account Number:  [id number]     Admit date:  05/26/2013  Clinical Social Worker:  Hulan Fray  Date/Time:  05/28/2013 01:46 PM  Referred by:  Physician  Date Referred:  05/27/2013 Referred for  Other - See comment   Other Referral:   Admit from facility- [facility] [phone])   Interview type:  Patient Other interview type:   Berton [phone]    PSYCHOSOCIAL DATA Living Status:  FACILITY Admitted from facility:  [facility] Level of care:  Assisted Living Primary support name:  Foxx Klarich Primary support relationship to patient:  FAMILY Degree of support available:   adequate    CURRENT CONCERNS Current Concerns  Post-Acute Placement   Other Concerns:    SOCIAL WORK ASSESSMENT / PLAN Clinical Social Worker received referral for patient being admitted from facility. CSW introduced self and explained reason for visit. Patient reported that he is from ALF, [facility] and plans to return by non-emergent ambulance.   Assessment/plan status:  Psychosocial Support/Ongoing Assessment of Needs Other assessment/ plan:   Information/referral to community resources:   Patient is from facility.    PATIENT'S/FAMILY'S RESPONSE TO PLAN OF CARE: Patient reported that he from [facility] and plans to return back at discharge. Patient was appreciative of CSW"s visit and assistance.

## 2013-05-28 NOTE — Progress Notes (Signed)
FMTS Attending Note Patient seen and examined by me today, I have discussed with resident team and I agree with the assess/plan for discharge today as per Dr Dennison Nancy note.  Patient resides in care facility, will arrange for him to return there today.  To continue on oral Levaquin for treatment of his left-sided pneumonia.  Paula Compton, MD

## 2013-05-28 NOTE — Discharge Summary (Signed)
Family Medicine Teaching [facility] Discharge Summary  Patient name: Malik [name] Medical record number: [id number] Date of birth: 05/29/1966 Age: 47 y.o. Gender: male Date of Admission: 05/26/2013  Date of Discharge: 05/28/2013 Admitting Physician: Sanjuana Letters, MD  Primary Care Provider: Simone Curia, MD Consultants: None  Indication for Hospitalization: Severe sepsis  Discharge Diagnoses/Problem List:  1. Severe sepsis secondary to HCAP 2. HTN 3. History of Myotonic Muscular Dystrophy 4. Vitamin B12 deficiency  Disposition: Retirement Home  Discharge Condition: Stable  Brief Hospital Course:  Mr. Jenifer was admitted due to shortness of breath and productive cough. In the ED he was borderline hypotensive/tachycardic and was given 1L NS bolus and started on vancomycin/zosyn x1 dose along with [place] O2 to keep saturations greater than 92%. Blood cultures were obtained after one dose of antibiotics and have no growth. Sputum culture (not usable), urine legionella (in process), urine strep pneumo (negative), and HIV antibody (negative) labs were drawn. He was switched to cefepime/vancomycin on admission and later to Levaquin for atypical appearance on chest x-ray. On discharge, he is day #2 on Levaquin (for total of 7 days) and day #3 total antibiotics. His potassium was low and repleated. Home vitamin B12, Remeron were continued and home HCTZ was discontinued due to low blood pressures. He was started on a PPI and will be discharged on one.  Issues for Follow Up:  1. Currently suffering from malnutrition. Very thin appearing. Possibly reevaluate nutrition requirements.  Significant Procedures: None  Significant Labs and Imaging:   Recent Labs Lab 05/26/13 1806 05/27/13 0615 05/28/13 0530  WBC 11.2* 9.0 8.8  HGB 15.4 12.7* 11.8*  HCT 44.8 36.5* 34.2*  PLT 199 193 185    Recent Labs Lab 05/26/13  05/26/13 1806 05/27/13 0615 05/27/13 1057 05/28/13 0530   NA  --   --  135 138  --  143  K  --   < > 3.7 2.7*  --  4.0  CL  --   --  95* 101  --  111  CO2  --   --  26 27  --  24  GLUCOSE  --   --  111* 107*  --  100*  BUN  --   --  14 11  --  7  CREATININE 0.56  --  0.49* 0.48*  --  0.51  CALCIUM  --   --  9.9 8.8  --  8.6  MG  --   --   --   --  2.1  --   ALKPHOS  --   --  109 113  --  86  AST  --   --  52* 25  --  24  ALT  --   --  63* 39  --  28  ALBUMIN  --   --  3.2* 2.6*  --  2.3*  < > = values in this interval not displayed.  HIV: Non-reactive Sputum culture: not usable Urine strep pneumo: negative  Outstanding Results:  Urine legionella: in process  Discharge Medications:    Medication List    ASK your doctor about these medications       hydrochlorothiazide 12.5 MG capsule  Commonly known as:  MICROZIDE  Take 12.5 mg by mouth every morning.     HYDROcodone-acetaminophen 5-325 MG per tablet  Commonly known as:  NORCO/VICODIN  Take 2 tablets by mouth every 4 (four) hours as needed for pain.     ibuprofen 200 MG tablet  Commonly known as:  ADVIL,MOTRIN  Take 400 mg by mouth every 6 (six) hours as needed for pain.     mirtazapine 15 MG tablet  Commonly known as:  REMERON  Take 15 mg by mouth at bedtime.     vitamin B-12 1000 MCG tablet  Commonly known as:  CYANOCOBALAMIN  Take 1,000 mcg by mouth daily.     vitamin C 500 MG tablet  Commonly known as:  ASCORBIC ACID  Take 500 mg by mouth daily.     Vitamin D (Ergocalciferol) 50000 UNITS Caps  Commonly known as:  DRISDOL  Take 50,000 Units by mouth every Sunday.        Discharge Instructions: Please refer to Patient Instructions section of EMR for full details.  Patient was counseled important signs and symptoms that should prompt return to medical care, changes in medications, dietary instructions, activity restrictions, and follow up appointments.   Follow-Up Appointments:   Jacquelin Hawking, MD 05/28/2013, 12:02 PM PGY-1, [facility] Family Medicine

## 2013-05-28 NOTE — Progress Notes (Signed)
Family Medicine Teaching Service Daily Progress Note Intern Pager: [phone]  Patient name: Malik [name] Medical record number: [id number] Date of birth: February 20, 1966 Age: 47 y.o. Gender: male  Primary Care Provider: Simone Curia, MD Consultants: None Code Status: DNR  Pt Overview and Major Events to Date:  7/26 - Severe Sepsis Criteria resolved with fluids 7/27 - Swallow Eval - ?reflux, regular diet, thin liquids with straw (ground meats) 7/28 - Levaquin day #2 of 7  Assessment and Plan: 47 yo male with Left Sided PNA.  PMH significant for Myotonic Muscular Dystrophy  1. Severe Sepsis Secondary to HCAP - Pt w/ left sided diffuse PNA, leukocytosis, tachycardia, hypotensive but responded to fluid bolus.   - respiratory status is improved - Cortisol and TSH normal - continue Levaquin - HIV negative - Blood Cx, no growth - Legionella pending - maintaining O2 at rest > 94% room air  2. Protein Calorie Malnutrition and Transaminitis - likely nutritionally related as well as ? Mild shock liver. - Continue current diet: regular, thin liquid (ground meats)  3. Hypokalemia - corrected - magnesium wnl  4. Hx of myotonic Muscular Dystrophy: - managed at [facility]; on remeron - concerns for aspiration, SLP eval done earlier  5. Vit B 12 Deficency - continue home meds  6. HTN - on HCTZ at home, continue to hold  7. Singultus - chronic intermittent for years - consider neurontin if continues but defer given relative hypotension - Treat reflux  8. Reflux/Swallowing: SLP performed bedside eval.  Consider reflux - continue protonix   FEN/GI: Regular Diet with thin liquids, ground meat; NS @ 180ml/hr PPx: Heparin SQ, continue protonix  Disposition: To return to [facility].  SW consulted.  Pt requesting new bed placement  Subjective: Reports feeling better overall. No complaints. Wants to go back to retirement home.  Objective: Temp:  [98.3 F  (36.8 C)-98.6 F (37 C)] 98.3 F (36.8 C) (07/28 0542) Pulse Rate:  [80-85] 80 (07/28 0542) Resp:  [16-18] 16 (07/28 0542) BP: (91-101)/(56-83) 91/58 mmHg (07/28 0542) SpO2:  [98 %-99 %] 99 % (07/28 0542)  Physical Exam: General: NAD, resting in bed, very thin,  Cardiovascular: RRR, S1/S2 heard, no murmur Respiratory: mild crackles in left lobe, good air movement, no wheezes Abdomen: +BS, soft, non-tender, non-distended Extremities: warm, well perfused, slow to move with generalized weakness  Laboratory:  Recent Labs Lab 05/26/13 1806 05/27/13 0615 05/28/13 0530  WBC 11.2* 9.0 8.8  HGB 15.4 12.7* 11.8*  HCT 44.8 36.5* 34.2*  PLT 199 193 185    Recent Labs Lab 05/26/13 1806 05/27/13 0615 05/28/13 0530  NA 135 138 143  K 3.7 2.7* 4.0  CL 95* 101 111  CO2 26 27 24   BUN 14 11 7   CREATININE 0.49* 0.48* 0.51  CALCIUM 9.9 8.8 8.6  PROT 7.3 5.7* 5.5*  BILITOT 1.8* 1.6* 0.8  ALKPHOS 109 113 86  ALT 63* 39 28  AST 52* 25 24  GLUCOSE 111* 107* 100*   HIV: Non-reactive Strep pneumo: negative Blood cultures: no growth  Jacquelin Hawking, MD 05/28/2013, 11:59 AM PGY-1, [facility] FPTS Intern pager: [phone], text pages welcome

## 2013-05-28 NOTE — Progress Notes (Signed)
Pt pulled out his IV, removed tele leads and box, and unplugged pulse oximeter. Says "I'm going home". Notified  Dr. Leveda Anna via Loretha Stapler text. No orders.Irena Reichmann 05/28/2013

## 2013-05-28 NOTE — Progress Notes (Addendum)
Speech Language Pathology Dysphagia Treatment Patient Details Name: Malik [name] MRN: [id number] DOB: 16-Oct-1966 Today's Date: 05/28/2013 Time: 4098-1191 SLP Time Calculation (min): 15 min  Assessment / Plan / Recommendation Clinical Impression  Pt seen for skilled SLP treatment to assess tolerance of po diet and to see if pt received his dentures.  Pt reports his dentures are loose and he does not have adhesive therefore he consumed breakfast without them.  Encouraged pt to get adhesive for dentures.  Pt denies difficulties swallowing breakfast or coughing with intake today or last night.  He does report he has a "smoker's cough".   Pt again admitted to occasional heartburn/reflux symptoms and mainly complains of his hiccups occuring approximately once a month for days at a time.    Options reviewed to leave diet regular with ground meats or to change to soft- pt expressed desire for meats ground only.  Note pt now on a PPI- thank you. Educated pt to general aspiration/reflux precautions and dysphagia associated with his neuromuscular disorder.  SLP to sign off as all education is completed.  Thanks.      Diet Recommendation  Continue with Current Diet: Regular;Thin liquid (ground meats)    SLP Plan All goals met   Pertinent Vitals/Pain Afebrile, decreased   Swallowing Goals  SLP Swallowing Goals Patient will utilize recommended strategies during swallow to increase swallowing safety with: Independent assistance Swallow Study Goal #2 - Progress: Met  General Temperature Spikes Noted: No Respiratory Status: Room air Behavior/Cognition: Alert;Distractible;Impulsive;Other (comment);Doesn't follow directions (lying in bed on side stating he is going home today) Oral Cavity - Dentition: Dentures, bottom;Dentures, top (pt now does not have denture adhesive)  Oral Cavity - Oral Hygiene   oral cavity clear  Dysphagia Treatment Treatment focused on: Patient/family/caregiver  education Family/Caregiver Educated: pt Patient observed directly with PO's: No Reason PO's not observed: Other (comment);Refused (pt decilned, just finished breakfast) Feeding: Able to feed self   GO    Donavan Burnet, MS [facility] SLP [phone]

## 2013-06-02 LAB — CULTURE, BLOOD (ROUTINE X 2)
Culture: NO GROWTH
Culture: NO GROWTH

## 2013-06-04 ENCOUNTER — Encounter: Payer: Self-pay | Admitting: Family Medicine

## 2013-06-08 ENCOUNTER — Telehealth: Payer: Self-pay | Admitting: Family Medicine

## 2013-06-08 ENCOUNTER — Encounter: Payer: Self-pay | Admitting: Family Medicine

## 2013-06-08 NOTE — Telephone Encounter (Signed)
Please call Mr Vandewater to ask him if he still needs his wheelchair. I got a fax from [facility] asking [place] to send them documentation about if he needs this. If he needs it, I would like to support him with a letter. However, I still want him getting up if/when possible to maintain the strength he has and redistribute weight to avoid ulcers. Ask how often he is needing this and for what (I am assuming due to muscle weakness with his Myotonic Muscular Dystrophy).   Once I know if he is using the wheelchair, how often he is needing it, and for what/at what times, I will write the letter.  Thanks.  [name], MD

## 2013-06-08 NOTE — Telephone Encounter (Addendum)
Wrote letter supporting wheelchair use in this patient for safe mobility. Please print and fax to [facility] at [phone] ATTN: Lavell Islam, as I am out this coming week.  [name], MD

## 2013-06-08 NOTE — Telephone Encounter (Signed)
Called pt and he stated that he is needing the wheelchair at all times and is not getting up and walking at all. Stated that he needs it to get around due to muscle weakness. Encouraged to get up and mover around if possible. Wyatt Haste, RN-BSN

## 2013-06-11 NOTE — Telephone Encounter (Signed)
Faxed letter to [facility].  Jazmin Hartsell,CMA

## 2013-06-22 ENCOUNTER — Ambulatory Visit (INDEPENDENT_AMBULATORY_CARE_PROVIDER_SITE_OTHER): Payer: Medicaid Other | Admitting: Family Medicine

## 2013-06-22 VITALS — BP 138/82 | HR 93 | Temp 98.0°F | Wt 108.0 lb

## 2013-06-22 DIAGNOSIS — I10 Essential (primary) hypertension: Secondary | ICD-10-CM

## 2013-06-22 DIAGNOSIS — L708 Other acne: Secondary | ICD-10-CM

## 2013-06-22 DIAGNOSIS — J189 Pneumonia, unspecified organism: Secondary | ICD-10-CM

## 2013-06-22 DIAGNOSIS — L720 Epidermal cyst: Secondary | ICD-10-CM | POA: Insufficient documentation

## 2013-06-22 DIAGNOSIS — Z515 Encounter for palliative care: Secondary | ICD-10-CM | POA: Insufficient documentation

## 2013-06-22 DIAGNOSIS — L700 Acne vulgaris: Secondary | ICD-10-CM

## 2013-06-22 DIAGNOSIS — Z23 Encounter for immunization: Secondary | ICD-10-CM

## 2013-06-22 DIAGNOSIS — L989 Disorder of the skin and subcutaneous tissue, unspecified: Secondary | ICD-10-CM

## 2013-06-22 MED ORDER — HYDROCHLOROTHIAZIDE 12.5 MG PO CAPS: 12.5000 mg | ORAL_CAPSULE | Freq: Every morning | ORAL | Status: DC

## 2013-06-22 NOTE — Assessment & Plan Note (Signed)
BP stable today.  - Restart HCTZ that was stopped during hospital course, reordered

## 2013-06-22 NOTE — Patient Instructions (Addendum)
Good to meet you Malik [name]. I am glad you are feeling well. If you redevelop symptoms of pneumonia or fever or chills or concerns, come back sooner or seek immediate care. Otherwise, follow up in 1 month for general health maintenance and to check on your skin findings. You do not have an abscess but rather a blackhead, but here is information on if it turns into a worse infection like an abscess. Restart taking your HCTZ (blood pressure medication). Use warm compresses to forehead and abdomen. Use sunscreen, especially on dorsum of right hand.  [name], MD 06/22/2013 2:07 PM    Abscess An abscess is an infected area that contains a collection of pus and debris.It can occur in almost any part of the body. An abscess is also known as a furuncle or boil. CAUSES  An abscess occurs when tissue gets infected. This can occur from blockage of oil or sweat glands, infection of hair follicles, or a minor injury to the skin. As the body tries to fight the infection, pus collects in the area and creates pressure under the skin. This pressure causes pain. People with weakened immune systems have difficulty fighting infections and get certain abscesses more often.  SYMPTOMS Usually an abscess develops on the skin and becomes a painful mass that is red, warm, and tender. If the abscess forms under the skin, you may feel a moveable soft area under the skin. Some abscesses break open (rupture) on their own, but most will continue to get worse without care. The infection can spread deeper into the body and eventually into the bloodstream, causing you to feel ill.  DIAGNOSIS  Your caregiver will take your medical history and perform a physical exam. A sample of fluid may also be taken from the abscess to determine what is causing your infection. TREATMENT  Your caregiver may prescribe antibiotic medicines to fight the infection. However, taking antibiotics alone usually does not cure an abscess.  Your caregiver may need to make a small cut (incision) in the abscess to drain the pus. In some cases, gauze is packed into the abscess to reduce pain and to continue draining the area. HOME CARE INSTRUCTIONS   Only take over-the-counter or prescription medicines for pain, discomfort, or fever as directed by your caregiver.  If you were prescribed antibiotics, take them as directed. Finish them even if you start to feel better.  If gauze is used, follow your caregiver's directions for changing the gauze.  To avoid spreading the infection:  Keep your draining abscess covered with a bandage.  Wash your hands well.  Do not share personal care items, towels, or whirlpools with others.  Avoid skin contact with others.  Keep your skin and clothes clean around the abscess.  Keep all follow-up appointments as directed by your caregiver. SEEK MEDICAL CARE IF:   You have increased pain, swelling, redness, fluid drainage, or bleeding.  You have muscle aches, chills, or a general ill feeling.  You have a fever. MAKE SURE YOU:   Understand these instructions.  Will watch your condition.  Will get help right away if you are not doing well or get worse. Document Released: 07/28/2005 Document Revised: 04/18/2012 Document Reviewed: 12/31/2011 [facility] Patient Information 2014 [place], [place].

## 2013-06-22 NOTE — Assessment & Plan Note (Signed)
Large blackheads x 2 (forehead, abdomen) that are firm, 1-1.5cm of firm swelling that is not red, tender, or infected-appearing. - Warm compresses - Follow up at health maintenance visit in 1 month - If still present, consider I&D

## 2013-06-22 NOTE — Assessment & Plan Note (Signed)
States he is currently at baseline weight (108lbs). - Continue mighty shakes and regular diet. - Consider nutrition consult if loses weight.

## 2013-06-22 NOTE — Assessment & Plan Note (Addendum)
Resolved. with no signs of current pneumonia. - F/u 1 month for health maintenance visit - return precautions reviewed - Given pneumovac, TDAP, and flu shot today

## 2013-06-22 NOTE — Assessment & Plan Note (Signed)
Right hand with 1-2cm patch of hypopigmented skin that appears atrophied. - Recommended sunscreen - Consider biopsy if unchanged at f/u.

## 2013-06-22 NOTE — Progress Notes (Signed)
Patient ID: Malik [name], male   DOB: [birth year], 47 y.o.   MRN: [id number] Subjective:   CC: Hospital follow up  HPI:   1. Hospital follow up for severe sepsis - Discharged and completed course of 7 days levaquin, with no further episodes of fevers, chills, shortness of breath, or coughing. Urine legionella was pending and is negative. Blood culture results negative final.  2. Hand lesion - Dorsum of hand with hypopigmented skin that is not symptomatic. Does not wear sunscreen. Present for "years."  3. Forehead and abdominal blackheads - Present for years, not itchy or painful or draining. Once a provider squeezed open lots from it years ago.  4. Malnutrition - Pt states he is at baseline weight and eats regular diet with mighty shakes 1-3 tiems daily.  .  Review of Systems - Per HPI.   PMH: Myotonic muscular dystrophy  SH:  Lives in a ALF   Objective:  Physical Exam BP 138/82  Pulse 93  Temp(Src) 98 F (36.7 C) (Oral)  Wt 108 lb (48.988 kg)  BMI 16.42 kg/m2 GEN: NAD HEENT: Atraumatic, normocephalic, neck supple, EOMI, sclera clear  CV: RRR, no murmurs, rubs, or gallops PULM: CTAB, normal effort ABD: Soft, nontender, nondistended, NABS, no organomegaly SKIN: No rash or cyanosis; warm and well-perfused, Forehead with blackhead indurated and 1 cm, abdomen with same that is 1 cm, nonerythematous, nondraining, nonfluctuant, nontender; right hand dorsal surface with 1cm hypopigmented and atrophied skin lesion EXTR: No lower extremity edema or calf tenderness, very thin extremities PSYCH: Mood and affect euthymic, normal rate and volume of speech NEURO: Awake, alert, no focal deficits grossly, normal speech, in wheelchair, moves all extremities but weakly  Assessment:     Malik [name] is a 47 y.o. male with h/o recent sepsis/HCAP here for hospital follow up.    Plan:     # See problem list for problem-specific plans. - Health maintenance visit 1 month - PHQ-9 on f/u

## 2013-07-11 ENCOUNTER — Other Ambulatory Visit: Payer: Self-pay | Admitting: Family Medicine

## 2013-07-25 ENCOUNTER — Ambulatory Visit (INDEPENDENT_AMBULATORY_CARE_PROVIDER_SITE_OTHER): Payer: Medicaid Other | Admitting: Family Medicine

## 2013-07-25 ENCOUNTER — Encounter: Payer: Self-pay | Admitting: Family Medicine

## 2013-07-25 VITALS — BP 98/72 | HR 60 | Temp 98.5°F | Ht 68.0 in | Wt 125.0 lb

## 2013-07-25 DIAGNOSIS — M6281 Muscle weakness (generalized): Secondary | ICD-10-CM

## 2013-07-25 DIAGNOSIS — R634 Abnormal weight loss: Secondary | ICD-10-CM

## 2013-07-25 DIAGNOSIS — I10 Essential (primary) hypertension: Secondary | ICD-10-CM

## 2013-07-25 DIAGNOSIS — E559 Vitamin D deficiency, unspecified: Secondary | ICD-10-CM

## 2013-07-25 DIAGNOSIS — L720 Epidermal cyst: Secondary | ICD-10-CM

## 2013-07-25 DIAGNOSIS — E538 Deficiency of other specified B group vitamins: Secondary | ICD-10-CM

## 2013-07-25 DIAGNOSIS — Z0000 Encounter for general adult medical examination without abnormal findings: Secondary | ICD-10-CM

## 2013-07-25 DIAGNOSIS — L723 Sebaceous cyst: Secondary | ICD-10-CM

## 2013-07-25 DIAGNOSIS — J189 Pneumonia, unspecified organism: Secondary | ICD-10-CM

## 2013-07-25 LAB — LIPID PANEL
Cholesterol: 217 mg/dL — ABNORMAL HIGH (ref 0–200)
HDL: 52 mg/dL (ref >39)
LDL Cholesterol: 104 mg/dL — ABNORMAL HIGH (ref 0–99)
Total CHOL/HDL Ratio: 4.2 Ratio
Triglycerides: 303 mg/dL — ABNORMAL HIGH (ref <150)
VLDL: 61 mg/dL — ABNORMAL HIGH (ref 0–40)

## 2013-07-25 NOTE — Progress Notes (Signed)
Patient ID: Jayin Derousse, male   DOB: 02-Jun-1966, 47 y.o.   MRN: [id number] Subjective:   CC: Follow-up HPI:   1. HCAP - No shortness of breath, chest pain, or fever. Patient feels  Back to baseline.  2. Forehead lesion - Cyst on forehead not changed, no bigger or smaller, denies pain, not erythematous, using warm compresses 2x/day but there has been no change or drainage. It does not bother him. Denies fevers/chills.  3. Blood pressure - 98/72 today, 120s/80s usually. Weight has increased to 125lbs from 110 he states he was when he moved to the current facility. He denies SOB, PND, orthopnea, LE edema. He drinks 4 mountain dews daily.  4. Strength - Pt at baseline since February, when he broke his patella. He has not been able to walk since, but before that was able to walk with a cane. He does not want to do physical therapy formally but would do it on his own at facility.  Review of Systems - Per HPI. Denies chest pain, shortness of breath, dizziness, headache, syncope, blurred vision, abdominal pain, increased weakness, muscle pain, or change to bowel/bladder.  PMH: Deleted norco because not taking it - was for a broken patella in Feb Reviewed other meds.  SH: Cut down on smoking on bday to 1/2 ppd Complaint about roommate - "stealing my stuff."  FH - GF died of cancer (leukemia 33), GM died of cancer (lung cancer, smoke)   Objective:  Physical Exam BP 98/72  Pulse 60  Temp(Src) 98.5 F (36.9 C) (Oral)  Ht 5\' 8"  (1.727 m)  Wt 125 lb (56.7 kg)  BMI 19.01 kg/m2 GEN: NAD, pleasant HEENT: Atraumatic, normocephalic, neck supple, EOMI, sclera clear  CV: RRR, no murmurs, rubs, or gallops PULM: CTAB, normal effort ABD: Soft, nontender, nondistended, no organomegaly SKIN: No rash or cyanosis; warm and well-perfused, forehead and sternum with blackhead; sternal blackhead with 1cm cyst around it, nonerythematous, nontender, nonfluctuant, nondraining EXTR: No lower extremity edema  or calf tenderness, extremities thin and cool PSYCH: Mood and affect euthymic, normal rate and volume of speech NEURO: Awake, alert, normal speech, EOMI, seated in wheelchair with decreased muscle strength grossly, able to move all extremtieis    Assessment:     Braheem Tomasik is a 47 y.o. male here for follow-up of HCAP and BP and forehead lesion.    Plan:     # See problem list for problem-specific plans. - F/u 6 months for health maintenance - PHQ-9 at that time - Falls risk assessment at that time - Checking lipid panel today, though had 1/2 can mountain dew 2 hours ago.  #HCAP - Resolved

## 2013-07-25 NOTE — Patient Instructions (Addendum)
It was great to see you today.  We are checking labs and I will call you if they are NOT normal. Feel free to call in if you need to know any results.  For your weight changes, keep an eye on this at home and if it starts fluctuating or you continue to gain, please come see me. If you get short of breath, leg swelling, chest pain, or other symptoms, come see me too.  For your forehead, you can keep watching it and if it seems to get inflamed or you have a fever, come in to see me. Otherwise, we can watch it. It looks like a sebaceous cyst.  Come in 6 months for a checkup. In the meantime, restart physical therapy on your own (in a place where you will not fall and exercises that don't put you at risk of falling) to keep your muscles as strong as you can.  Epidermal Cyst An epidermal cyst is sometimes called a sebaceous cyst, epidermal inclusion cyst, or infundibular cyst. These cysts usually contain a substance that looks "pasty" or "cheesy" and may have a bad smell. This substance is a protein called keratin. Epidermal cysts are usually found on the face, neck, or trunk. They may also occur in the vaginal area or other parts of the genitalia of both men and women. Epidermal cysts are usually small, painless, slow-growing bumps or lumps that move freely under the skin. It is important not to try to pop them. This may cause an infection and lead to tenderness and swelling. CAUSES  Epidermal cysts may be caused by a deep penetrating injury to the skin or a plugged hair follicle, often associated with acne. SYMPTOMS  Epidermal cysts can become inflamed and cause:  Redness.  Tenderness.  Increased temperature of the skin over the bumps or lumps.  Grayish-white, bad smelling material that drains from the bump or lump. DIAGNOSIS  Epidermal cysts are easily diagnosed by your caregiver during an exam. Rarely, a tissue sample (biopsy) may be taken to rule out other conditions that may resemble  epidermal cysts. TREATMENT   Epidermal cysts often get better and disappear on their own. They are rarely ever cancerous.  If a cyst becomes infected, it may become inflamed and tender. This may require opening and draining the cyst. Treatment with antibiotics may be necessary. When the infection is gone, the cyst may be removed with minor surgery.  Small, inflamed cysts can often be treated with antibiotics or by injecting steroid medicines.  Sometimes, epidermal cysts become large and bothersome. If this happens, surgical removal in your caregiver's office may be necessary. HOME CARE INSTRUCTIONS  Only take over-the-counter or prescription medicines as directed by your caregiver.  Take your antibiotics as directed. Finish them even if you start to feel better. SEEK MEDICAL CARE IF:   Your cyst becomes tender, red, or swollen.  Your condition is not improving or is getting worse.  You have any other questions or concerns. MAKE SURE YOU:  Understand these instructions.  Will watch your condition.  Will get help right away if you are not doing well or get worse. Document Released: 09/18/2004 Document Revised: 01/10/2012 Document Reviewed: 04/26/2011 [facility] Patient Information 2014 [place], [place].

## 2013-07-26 LAB — VITAMIN D 25 HYDROXY (VIT D DEFICIENCY, FRACTURES): Vit D, 25-Hydroxy: 67 ng/mL (ref 30–89)

## 2013-07-26 LAB — TSH: TSH: 0.979 u[IU]/mL (ref 0.350–4.500)

## 2013-07-26 LAB — VITAMIN B12: Vitamin B-12: 1188 pg/mL — ABNORMAL HIGH (ref 211–911)

## 2013-07-27 ENCOUNTER — Encounter: Payer: Self-pay | Admitting: Family Medicine

## 2013-07-27 NOTE — Assessment & Plan Note (Signed)
No signs of infection or change since last visit, no discomfort. - Monitor - Return if appears to change or become inflamed

## 2013-07-27 NOTE — Assessment & Plan Note (Signed)
Generalized progressive weakness, now in wheelchair most of the time though can move all extremities - Restart PTon your own that you learned from previous PT - Carefull to do seated exercises to avoid falling - F/u in 6 months

## 2013-07-27 NOTE — Assessment & Plan Note (Signed)
Reports weight gain of ~20 lbs, had previously had finding of weight loss ~20 lbs.  - No PND, orthopnea, new LE edema - Monitor - Return precautions reviewed - Checking TSH today

## 2013-07-27 NOTE — Assessment & Plan Note (Addendum)
Borderline low blood pressure today but with no dizziness. - Encouraged hydration with water and less mountain dew, 3 meals daily with snacks in between - Return precautions reviewed - F/u at next visit

## 2013-07-27 NOTE — Assessment & Plan Note (Signed)
Takes supplement. - Checking vitamin D today

## 2013-07-27 NOTE — Assessment & Plan Note (Signed)
Improved with no current symptoms. Resolved.

## 2013-07-31 ENCOUNTER — Telehealth: Payer: Self-pay | Admitting: Family Medicine

## 2013-07-31 MED ORDER — VITAMIN B-12 1000 MCG PO TABS: ORAL_TABLET | ORAL | Status: DC

## 2013-07-31 NOTE — Telephone Encounter (Signed)
Called and spoke to patient's head supervisor Tammy at his [facility]F to convey that will be decreasing oral B12 to daily except weekends and recheck B12 level on f/u in 6 months. Tammy voiced understanding and gave pt's cell number [phone]. - Faxed rx to take oral B12 daily M-F to [phone]. - Recheck B12 in 6 months.  Tammy reported a concern about patient not getting up much from bed and having decreased mood. States patient also having some fecal incontinence. - Encouraged her to keep encouraging him to get out of bed to do exercise we had discussed, even in wheelchair. She said classes are available. - Asked her to make clinic appt prior to 6 months if patient amenable. - Recommended PT eval, but Tammy stated pt's insurance previously said would not cover. Look into this.  Tammy requested TB skin test when patient returns.  [name], MD

## 2013-08-30 ENCOUNTER — Telehealth: Payer: Self-pay | Admitting: Family Medicine

## 2013-08-30 NOTE — Telephone Encounter (Signed)
Will forward to MD. Jazmin Hartsell,CMA  

## 2013-08-30 NOTE — Telephone Encounter (Signed)
He could come in for a doctor's visit unless he is having trouble breathing, passing out, or having severe pain. If he cannot get in to see [place] today or tomorrow, he could go to urgent care or the ED. [name], MD

## 2013-08-30 NOTE — Telephone Encounter (Signed)
Porschea called who is a caretaker of Malik [name]. He wants to go to the hospital for hiccups. She wanted to know if she should allow this or should he have a doctor's visit. He said he has had the hiccups for two weeks. JW

## 2013-08-30 NOTE — Telephone Encounter (Signed)
Pt has an appt tomorrow on crosscover. Jazmin Hartsell,CMA

## 2013-08-31 ENCOUNTER — Encounter: Payer: Self-pay | Admitting: Family Medicine

## 2013-08-31 ENCOUNTER — Ambulatory Visit (INDEPENDENT_AMBULATORY_CARE_PROVIDER_SITE_OTHER): Payer: Medicaid Other | Admitting: Family Medicine

## 2013-08-31 VITALS — BP 117/78 | HR 69 | Temp 97.9°F | Wt 126.0 lb

## 2013-08-31 DIAGNOSIS — R066 Hiccough: Secondary | ICD-10-CM

## 2013-08-31 MED ORDER — CHLORPROMAZINE HCL 25 MG PO TABS: 25.0000 mg | ORAL_TABLET | Freq: Three times a day (TID) | ORAL | Status: DC | PRN

## 2013-08-31 NOTE — Patient Instructions (Signed)
Take the Thorazine only if your hiccups return and do not go away within a few hours. Take one pill up to three times per day only if you need it.  Follow up with your regular doctor as needed.  Manroop Jakubowicz M. Carmeron Heady, M.D.  Hiccups Hiccups are automatic contractions of the diaphragm muscle which causes regular gulping movements. They usually occur without warning. There are no particular tests used to diagnose hiccups. Hiccups may result from overeating, drinking carbonated drinks or alcohol, sudden changes in temperature, eating spicy foods, or using tobacco. Here are some popular remedies that may stop a bout of hiccups:  Gargling ice water.  Swallowing granulated sugar.  Biting on a lemon.  Holding your breath, breathing fast, or breathing into a paper bag.  Bearing down.  Gasping after a sudden fright.  Pulling your tongue gently.  Distraction. Hiccups are usually considered harmless and do not point to a serious medical condition. However, there can be underlying medical problems that may cause hiccups, such as pneumonia, diabetes, metabolic problems, tumors, abdominal infections or injuries, and neurologic problems.You must follow up with your caregiver if your symptoms persist or become a frequent problem. SEEK MEDICAL CARE IF:   Hiccups last for more than 48 hours.  You are given medicine but your hiccups do not get better.  New symptoms show up.  You cannot sleep or eat due to the hiccups.  You have unexpected weight loss.  You have trouble breathing or swallowing.  You develop severe pain in your abdomen or other areas.  You develop numbness, tingling, or weakness. Document Released: 11/25/2004 Document Revised: 01/10/2012 Document Reviewed: 12/09/2010 [employer] Patient Information 2014 [place], [place].

## 2013-08-31 NOTE — Assessment & Plan Note (Signed)
Intractable hiccups, but currently not hiccuping. First line treatment is low dose Thorazine. Will given 25mg  to take TID prn ONLY if hiccups return and do not resolve within a few hours. Given 10 tabs to take if needed. If hiccups should persists, he will need to return for evaluation.

## 2013-08-31 NOTE — Progress Notes (Signed)
Patient ID: Malik [name], male   DOB: 19-Nov-1965, 47 y.o.   MRN: [id number]  [name] Roxanna Mcever M. Adler Chartrand, MD Phone: [phone]   Subjective: HPI: Patient is a 47 y.o. male presenting to clinic today for same day appointment for hiccups. Resident of [facility].   Patient reports hiccups daily x2 weeks. Unsure of specific triggers; relieved by eating for about 30 minutes, then they return. He drinks [facility] constantly out of a straw. He has tried holding breath, drinking water, eating sugar, etc. And nothing makes it better. Yesterday, while he was hiccuping he felt some SOB. He states he has had several days in a row with hiccups but never this long. Hiccups have not started yet today. No abd pain, N/V/D.   History Reviewed: Former smoker. Health Maintenance: UTD, had flu shot  ROS: Please see HPI above.  Objective: Office vital signs reviewed. BP 117/78  Pulse 69  Temp(Src) 97.9 F (36.6 C) (Oral)  Wt 126 lb (57.153 kg)  BMI 19.16 kg/m2  Physical Examination:  General: Awake, alert. NAD. Sitting in wheelchair. No active hiccups. HEENT: Atraumatic, normocephalic. MMM. Neck: No masses palpated. No LAD Pulm: CTAB, no wheezes Cardio: RRR, no murmurs appreciated Abdomen:+BS, soft, nontender, nondistended Extremities: No edema Neuro: Grossly intact  Assessment: 47 y.o. male with intractable hiccups  Plan: See Problem List and After Visit Summary

## 2013-10-02 ENCOUNTER — Telehealth: Payer: Self-pay | Admitting: Family Medicine

## 2013-10-02 NOTE — Telephone Encounter (Signed)
By lab results, pt has 7.7% 10 year ASCVD risk and recommendation is to start moderate or high intensity statin.   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  Will precept.  [name], MD

## 2013-10-08 ENCOUNTER — Other Ambulatory Visit: Payer: Self-pay | Admitting: Family Medicine

## 2013-11-05 ENCOUNTER — Emergency Department (HOSPITAL_COMMUNITY)
Admission: EM | Admit: 2013-11-05 | Discharge: 2013-11-05 | Disposition: A | Discharge status: Other Acute Inpatient Hospital | Payer: Medicaid Other | Attending: Emergency Medicine | Admitting: Emergency Medicine

## 2013-11-05 ENCOUNTER — Emergency Department (HOSPITAL_COMMUNITY): Payer: Medicaid Other

## 2013-11-05 ENCOUNTER — Encounter (HOSPITAL_COMMUNITY): Payer: Self-pay | Admitting: Emergency Medicine

## 2013-11-05 ENCOUNTER — Telehealth: Payer: Self-pay | Admitting: Family Medicine

## 2013-11-05 DIAGNOSIS — R5383 Other fatigue: Secondary | ICD-10-CM

## 2013-11-05 DIAGNOSIS — J159 Unspecified bacterial pneumonia: Secondary | ICD-10-CM | POA: Insufficient documentation

## 2013-11-05 DIAGNOSIS — R5381 Other malaise: Secondary | ICD-10-CM | POA: Insufficient documentation

## 2013-11-05 DIAGNOSIS — J189 Pneumonia, unspecified organism: Secondary | ICD-10-CM

## 2013-11-05 DIAGNOSIS — R9431 Abnormal electrocardiogram [ECG] [EKG]: Secondary | ICD-10-CM | POA: Insufficient documentation

## 2013-11-05 DIAGNOSIS — R509 Fever, unspecified: Secondary | ICD-10-CM

## 2013-11-05 DIAGNOSIS — Z79899 Other long term (current) drug therapy: Secondary | ICD-10-CM | POA: Insufficient documentation

## 2013-11-05 DIAGNOSIS — F17200 Nicotine dependence, unspecified, uncomplicated: Secondary | ICD-10-CM | POA: Insufficient documentation

## 2013-11-05 DIAGNOSIS — Z8739 Personal history of other diseases of the musculoskeletal system and connective tissue: Secondary | ICD-10-CM | POA: Insufficient documentation

## 2013-11-05 DIAGNOSIS — I10 Essential (primary) hypertension: Secondary | ICD-10-CM | POA: Insufficient documentation

## 2013-11-05 LAB — POCT I-STAT TROPONIN I: Troponin i, poc: 0 ng/mL (ref 0.00–0.08)

## 2013-11-05 MED ORDER — IBUPROFEN 600 MG PO TABS: 600.0000 mg | ORAL_TABLET | Freq: Three times a day (TID) | ORAL | Status: DC | PRN

## 2013-11-05 MED ORDER — ACETAMINOPHEN 500 MG PO TABS: 1000.0000 mg | ORAL_TABLET | Freq: Four times a day (QID) | ORAL | Status: DC | PRN

## 2013-11-05 MED ORDER — SODIUM CHLORIDE 0.9 % IV BOLUS (SEPSIS)
500.0000 mL | Freq: Once | INTRAVENOUS | Status: AC
  Administered 2013-11-05: 500 mL via INTRAVENOUS

## 2013-11-05 MED ORDER — DOXYCYCLINE HYCLATE 100 MG PO CAPS: 100.0000 mg | ORAL_CAPSULE | Freq: Two times a day (BID) | ORAL | Status: DC

## 2013-11-05 MED ORDER — LEVOFLOXACIN 750 MG PO TABS: 750.0000 mg | ORAL_TABLET | Freq: Every day | ORAL | Status: DC

## 2013-11-05 MED ORDER — ACETAMINOPHEN 325 MG PO TABS
650.0000 mg | ORAL_TABLET | Freq: Once | ORAL | Status: AC
  Administered 2013-11-05: 650 mg via ORAL
  Filled 2013-11-05: qty 2

## 2013-11-05 MED ORDER — LEVOFLOXACIN 750 MG PO TABS
750.0000 mg | ORAL_TABLET | Freq: Once | ORAL | Status: AC
  Administered 2013-11-05: 750 mg via ORAL
  Filled 2013-11-05: qty 1

## 2013-11-05 NOTE — Telephone Encounter (Signed)
Tammy called from the nursing home to request some antibiotics and tylenol for Malik [name]. He has a cough and a tempeture of 102. She wants to send him to the hospital but he is refusing to go and wants his doctor send in medication. Please fax Tammy at [phone] if we decided to send in medication and if she doesn't hear from us she is going to send him after 5 pm to the hospital anyway. jw

## 2013-11-05 NOTE — Telephone Encounter (Signed)
I called and spoke with Tammy, who reports 1 day of coughing up phlegm, chest cold, congestion, and temperature to 102.67F today. Patient has otherwise been behaving normally, eating, drinking, Tammy states he has not felt nauseated or had diarrhea or emesis, and has been tolerating normal PO. He has normal energy. Denies SOB and is able to speak with Tammy like usual. He has not had any major change in daily activity except over the past few days he has gone outdoors to smoke.  A/P: Unable to assess patient over phone. He sounds stable, but give his h/o HCAP and severe illness, I recommended he be evaluated immediately. I called our clinic staff who state there are no same-day appointments left for the day today, so I recommended he be evaluated at Urgent Care or the ED.  Tammy his nurse voiced understanding.  Leona SingletonMaria T Hugh Kamara, MD

## 2013-11-05 NOTE — ED Notes (Signed)
[facility], c/o fever and sob. Per ems Rhonchi in all files. Facilities temp 102.1. Pt received 2 nebs pta. 125 solumedrol via EMS

## 2013-11-05 NOTE — Discharge Instructions (Signed)
If you have any chest pain, shortness of breath or change your mind and would like to be further evaluated for your heart please see a doctor or return to the ER. Your EKG is abnormal and you need close follow up with your physician and cardiology.  If you were given medicines take as directed.  If you are on coumadin or contraceptives realize their levels and effectiveness is altered by many different medicines.  If you have any reaction (rash, tongues swelling, other) to the medicines stop taking and see a physician.   Please follow up as directed and return to the ER or see a physician for new or worsening symptoms.  Thank you.

## 2013-11-05 NOTE — ED Provider Notes (Signed)
CSN: [id number]     Arrival date & time 11/05/13  1558 History   First MD Initiated Contact with Patient 11/05/13 1559     Chief Complaint  Patient presents with  . Shortness of Breath   (Consider location/radiation/quality/duration/timing/severity/associated sxs/prior Treatment) HPI Comments: 48 yo male from retirement community, MD hx, HTN, no current steroids or abx, no recent travel, pt with flu around him presents with cough/ fever for two days.  TOlerating po.  He lives in retirement community. Pt had steroids/ neb PTA.  Prior to even recommending pt stated No matter what pt states he will not come into hospital.  Pt has had pneumonia before, smoker.  Sxs intermittent.   Patient is a 48 y.o. male presenting with shortness of breath. The history is provided by the patient.  Shortness of Breath Associated symptoms: cough and fever   Associated symptoms: no abdominal pain, no chest pain, no headaches, no neck pain, no rash and no vomiting     Past Medical History  Diagnosis Date  . Myotonic muscular dystrophy   . Hypertension    Past Surgical History  Procedure Laterality Date  . Multiple tooth extractions  2007    Pt had all teeth pulled, has not yet gotten dentures.   . Cholecystectomy     Family History  Problem Relation Age of Onset  . COPD Mother   . Diabetes Father    History  Substance Use Topics  . Smoking status: Current Some Day Smoker -- 0.30 packs/day    Types: Cigarettes  . Smokeless tobacco: Never Used     Comment: Pt states that he has not bought any since mid september, but will smoke if he finds one  . Alcohol Use: No    Review of Systems  Constitutional: Positive for fever. Negative for chills.  HENT: Positive for congestion.   Eyes: Negative for visual disturbance.  Respiratory: Positive for cough. Negative for shortness of breath.   Cardiovascular: Negative for chest pain.  Gastrointestinal: Negative for vomiting and abdominal pain.   Genitourinary: Negative for dysuria and flank pain.  Musculoskeletal: Negative for back pain, neck pain and neck stiffness.  Skin: Negative for rash.  Neurological: Negative for light-headedness and headaches.    Allergies  Review of patient's allergies indicates no known allergies.  Home Medications   Current Outpatient Rx  Name  Route  Sig  Dispense  Refill  . chlorproMAZINE (THORAZINE) 25 MG tablet   Oral   Take 1 tablet (25 mg total) by mouth 3 (three) times daily as needed (for intractable hiccups).   10 tablet   0   . hydrochlorothiazide (MICROZIDE) 12.5 MG capsule   Oral   Take 1 capsule (12.5 mg total) by mouth every morning.   30 capsule   11   . ibuprofen (ADVIL,MOTRIN) 200 MG tablet   Oral   Take 400 mg by mouth every 6 (six) hours as needed for pain.         . mirtazapine (REMERON) 15 MG tablet   Oral   Take 15 mg by mouth at bedtime.         . pantoprazole (PROTONIX) 40 MG tablet      TAKE ONE TABLET BY MOUTH ONCE DAILY.   30 tablet   2   . vitamin C (ASCORBIC ACID) 500 MG tablet   Oral   Take 500 mg by mouth daily.         . Vitamin D, Ergocalciferol, (DRISDOL) 50000 UNITS  CAPS   Oral   Take 50,000 Units by mouth every Sunday.          BP 127/69  Pulse 97  Temp(Src) 99.9 F (37.7 C) (Oral)  Resp 15  SpO2 99% Physical Exam  Nursing note and vitals reviewed. Constitutional: He is oriented to person, place, and time. He appears well-developed and well-nourished.  HENT:  Head: Normocephalic and atraumatic.  Mild dry mm  Eyes: Conjunctivae are normal. Right eye exhibits no discharge. Left eye exhibits no discharge.  Neck: Normal range of motion. Neck supple. No tracheal deviation present.  Cardiovascular: Normal rate and regular rhythm.   Pulmonary/Chest: Effort normal. He has rales (bilateral lower/ middle).  Abdominal: Soft. He exhibits no distension. There is no tenderness. There is no guarding.  Musculoskeletal: He exhibits no  edema and no tenderness.  Neurological: He is alert and oriented to person, place, and time. GCS eye subscore is 4. GCS verbal subscore is 5. GCS motor subscore is 6.  Mild general weakness  Skin: Skin is warm. No rash noted.  Psychiatric: He has a normal mood and affect.    ED Course  Procedures (including critical care time) Labs Review Labs Reviewed  POCT I-STAT TROPONIN I   Imaging Review Dg Chest 2 View  11/05/2013   CLINICAL DATA:  Cough and shortness of breath.  EXAM: CHEST  2 VIEW  COMPARISON:  05/26/2013  FINDINGS: Two views of the chest were obtained. Patchy densities at the left lung base. Some of these densities appear chronic and could represent bronchiectasis. A few of the densities have a nodular configuration near the costophrenic angle. Upper lungs are clear. Heart size is normal. Trachea is midline.  IMPRESSION: Patchy densities in the left lower lung could represent an acute infectious or inflammatory process. Recommend follow up chest radiographs to ensure resolution of the left basilar densities, particularly the more nodular densities along the lateral aspect of the lower chest.   Electronically Signed   By: Richarda Overlie M.D.   On: 11/05/2013 17:05    EKG Interpretation    Date/Time:  Monday November 05 2013 16:06:45 EST Ventricular Rate:  94 PR Interval:  165 QRS Duration: 85 QT Interval:  408 QTC Calculation: 510 R Axis:   -7 Text Interpretation:  Sinus rhythm Left ventricular hypertrophy Mild anterior ST elevation, no recipricol changes Prolonged QT interval Confirmed by Tranika Scholler  MD, Neylan Koroma ([id number]) on 11/05/2013 4:52:57 PM            MDM   1. CAP (community acquired pneumonia)   2. Fever   3. Abnormal EKG    Clinically flu vs pneumonia. Pt well appearing on exam. CXR, nebs and fluids in ED.    EKG done per protocol prior to seeing patient, I did review it since it was done and it is abnormal, LVH/ ST elevation without recipricol changes.  I discussed  in detail with patient, when I went in the room and explained that the reason the ekg was done was per protocol for sob he stated "I do not even have shortness of breath or chest pain". He has no cp or sob, he has no known heart issues.  I recommended a cardiac eval in ED and observation inpt for abnormal ekg and pneumonia.  Pt refused and wishes to fup outpt.   Patient has capacity to make decisions, understands benefits of hospitalization and risks of going home may result in worsening health condition including death.  Patient refuses hospital placement.  Patient understands they may return at any time.  I talked to him again and he still wishes to go home.   Levaquin given in ED.  QT reviewed prior to dc and mild prolonged, changed to doxycycline with strict return precautions.  POC troponin neg, pt refused other blood work.     Results and differential diagnosis were discussed with the patient. Close follow up outpatient was discussed, patient comfortable with the plan.   Diagnosis: above     Enid Skeens, MD 11/05/13 [phone]

## 2013-11-08 ENCOUNTER — Ambulatory Visit (INDEPENDENT_AMBULATORY_CARE_PROVIDER_SITE_OTHER): Payer: Medicaid Other | Admitting: Family Medicine

## 2013-11-08 ENCOUNTER — Encounter: Payer: Self-pay | Admitting: Family Medicine

## 2013-11-08 VITALS — BP 125/74 | HR 78 | Temp 97.8°F | Wt 125.0 lb

## 2013-11-08 DIAGNOSIS — J189 Pneumonia, unspecified organism: Secondary | ICD-10-CM

## 2013-11-08 NOTE — Progress Notes (Signed)
   Subjective:    Patient ID: Malik KocherRobert [name], male    DOB: 10-15-1966, 48 y.o.   MRN: [id number]  HPI Began having coughing and fever on 1/4.  Seen in ED on 1/5 with cough, flu and treated for pneumonia. Everyone in retirement home  Now on doxycycline and has improved.  Less coughing and no more fever. Had abnl CXR and EKG--to f/u with  tomorrow.   Review of Systems  Constitutional: Negative for fever and chills.  Respiratory: Positive for cough. Negative for chest tightness and shortness of breath.   Cardiovascular: Negative for chest pain.  Gastrointestinal: Negative for abdominal pain.       Objective:   Physical Exam  Vitals reviewed. Constitutional: He appears well-developed and well-nourished. No distress.  HENT:  Head: Normocephalic and atraumatic.  Eyes: No scleral icterus.  Neck: Neck supple.  Cardiovascular: Normal rate.   Pulmonary/Chest: Effort normal. He has rales in the right lower field and the left lower field.  Coarse breath sounds  Abdominal: Soft. There is no tenderness.  Musculoskeletal: Normal range of motion.  Neurological: He is alert.  Skin: Skin is dry. No rash noted.  Psychiatric: He has a normal mood and affect.          Assessment & Plan:

## 2013-11-08 NOTE — Patient Instructions (Signed)

## 2013-11-08 NOTE — Assessment & Plan Note (Signed)
On Doxycycline.  Will need repeat CXR to document clearing of opacities.

## 2013-11-09 ENCOUNTER — Encounter: Payer: Self-pay | Admitting: Cardiology

## 2013-11-09 ENCOUNTER — Ambulatory Visit (INDEPENDENT_AMBULATORY_CARE_PROVIDER_SITE_OTHER): Payer: Medicaid Other | Admitting: Cardiology

## 2013-11-09 VITALS — BP 124/62 | HR 72 | Ht 68.0 in

## 2013-11-09 DIAGNOSIS — R9431 Abnormal electrocardiogram [ECG] [EKG]: Secondary | ICD-10-CM | POA: Insufficient documentation

## 2013-11-09 DIAGNOSIS — J189 Pneumonia, unspecified organism: Secondary | ICD-10-CM

## 2013-11-09 DIAGNOSIS — G7111 Myotonic muscular dystrophy: Secondary | ICD-10-CM

## 2013-11-09 NOTE — Assessment & Plan Note (Signed)
The patient has significant muscle weakness because of this problem.

## 2013-11-09 NOTE — Progress Notes (Signed)
HPI  The patient today is seen as a new patient for the evaluation of an abnormal EKG. The patient has myotonic muscular dystrophy. He has no proven cardiac disease. I have reviewed old records. There is an echo report from 2011. Ejection fraction was normal. There is an echo report from 2012. The report questions slight decrease in wall motion. I feel that the wall motion is low normal.  Recently the patient was seen in the emergency room. He was there with an upper respiratory illness. EKG was done and there was diffuse mild J-point elevation. There was concern about this and there was question of keeping him in the hospital over the night. The final decision was that he could go home but that he would need cardiology followup to assess his EKGs. He has not been having chest pain. He was seen in the primary care office yesterday and he was stable. He is here today stable. He's wearing a mask because of his recent upper respiratory infection.  I have reviewed the EKG from the emergency room. I've also reviewed an older EKG that is in the system. He has old mild diffuse J-point elevation. There is no diagnostic abnormality.  No Known Allergies  Current Outpatient Prescriptions  Medication Sig Dispense Refill  . acetaminophen (TYLENOL) 500 MG tablet Take 2 tablets (1,000 mg total) by mouth every 6 (six) hours as needed for mild pain or fever.  30 tablet  0  . chlorproMAZINE (THORAZINE) 25 MG tablet Take 25 mg by mouth 3 (three) times daily as needed (for intractable hiccups).      . doxycycline (VIBRAMYCIN) 100 MG capsule Take 1 capsule (100 mg total) by mouth 2 (two) times daily. One po bid x 7 days  14 capsule  0  . hydrochlorothiazide (MICROZIDE) 12.5 MG capsule Take 1 capsule (12.5 mg total) by mouth every morning.  30 capsule  11  . HYDROcodone-acetaminophen (NORCO/VICODIN) 5-325 MG per tablet Take 2 tablets by mouth every 4 (four) hours as needed (for pain).      Marland Kitchen. ibuprofen (ADVIL,MOTRIN)  200 MG tablet Take 400 mg by mouth every 6 (six) hours as needed for pain.      Marland Kitchen. ibuprofen (ADVIL,MOTRIN) 600 MG tablet Take 1 tablet (600 mg total) by mouth every 8 (eight) hours as needed for fever or mild pain.  20 tablet  0  . mirtazapine (REMERON) 15 MG tablet Take 15 mg by mouth at bedtime.      . Nutritional Supplements (NUTRITIONAL DRINK PO) Take 1 each by mouth 2 (two) times daily. Mighty Shake      . pantoprazole (PROTONIX) 40 MG tablet Take 40 mg by mouth daily.      . vitamin B-12 (CYANOCOBALAMIN) 1000 MCG tablet Take 1,000 mcg by mouth See admin instructions. Monday thru Friday only      . vitamin C (ASCORBIC ACID) 500 MG tablet Take 500 mg by mouth daily.      . Vitamin D, Ergocalciferol, (DRISDOL) 50000 UNITS CAPS Take 50,000 Units by mouth every Sunday.       No current facility-administered medications for this visit.    History   Social History  . Marital Status: Single    Spouse Name: N/A    Number of Children: N/A  . Years of Education: N/A   Occupational History  . Not on file.   Social History Main Topics  . Smoking status: Current Some Day Smoker -- 0.30 packs/day    Types:  Cigarettes  . Smokeless tobacco: Never Used     Comment: Pt states that he has not bought any since mid september, but will smoke if he finds one  . Alcohol Use: No  . Drug Use: Yes    Special: Marijuana     Comment: Pt smokes MJ ocassinally.  Denies ever using IV drugs.   . Sexual Activity: Not Currently    Birth Control/ Protection: Condom     Comment: Pt states he always uses condomns when sexually active.    Other Topics Concern  . Not on file   Social History Narrative   Patient has stated to me that he wishes to be DNR/DNI.  We have discussed the slow progression that we anticipate with his myotonic muscular dystrophy, and he does not want interventions to artificially prolong his life such as trach/vent or feeding tube.  His father who is next of kin was present for this  discussion and agrees, understand's Brainard's wishes.  A traveling DNR was filled out 03/09/2013, and patient given information about living will and HCPOA.       Ardyth Gal, MD   03/09/2013             Family History  Problem Relation Age of Onset  . COPD Mother   . Diabetes Father     Past Medical History  Diagnosis Date  . Myotonic muscular dystrophy   . Hypertension     Past Surgical History  Procedure Laterality Date  . Multiple tooth extractions  2007    Pt had all teeth pulled, has not yet gotten dentures.   . Cholecystectomy      Patient Active Problem List   Diagnosis Date Noted  . EKG abnormality 11/09/2013  . CAP (community acquired pneumonia) 11/08/2013  . Hiccups 08/31/2013  . Health care maintenance 06/22/2013  . Epidermal cyst 06/22/2013  . Skin lesion of hand 06/22/2013  . Bilateral ankle fractures 01/10/2013  . 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 Hypertension, benign 01/20/2012  . Unspecified vitamin D deficiency 08/05/2011  . Dizziness 07/30/2011  . Vitamin B 12 deficiency 07/14/2011  . Myotonic muscular dystrophy 05/18/2011  . Muscle weakness (generalized) 04/16/2011  . Weight finding 04/16/2011  . Tobacco user 04/16/2011    ROS  Currently the patient denies fever, chills, headache, sweats, rash, change in vision, change in hearing, chest pain, nausea vomiting, urinary symptoms. All other systems are reviewed and are negative.  PHYSICAL EXAM  Patient is oriented to person time and place. Affect is normal. He's wearing a mask. There is no jugulovenous distention. Lungs reveal loud diffuse rhonchi. There is no respiratory distress. Cardiac exam reveals S1 and S2. The abdomen is soft. There is no peripheral edema. There no musculoskeletal deformities. There are no skin rashes.  Filed Vitals:   11/09/13 1424  BP: 124/62  Pulse: 72  Height: 5\' 8"  (1.727 m)   As noted above I have reviewed EKGs from the emergency room and from prior dates. There is  old mild diffuse J-point elevation. There was no significant change.  ASSESSMENT & PLAN

## 2013-11-09 NOTE — Assessment & Plan Note (Signed)
He had a recent respiratory infection that is being treated. He still has rhonchi. He is seeing his primary physician.

## 2013-11-09 NOTE — Assessment & Plan Note (Signed)
The patient has benign early repolarization on the EKG from the emergency room recently and from an older EKG. I feel that he needs no further workup. I've carefully reviewed old echoes and all of the records available. I feel it is most prudent to not proceed with any further cardiac workup at this time. I discussed this fully with the patient.

## 2013-11-09 NOTE — Telephone Encounter (Signed)
Please ask pt to come in to discuss, and to follow-up on recent ED visit.  Malik SingletonMaria T Jeraline Marcinek, MD

## 2013-11-09 NOTE — Patient Instructions (Signed)
Your physician recommends that you continue on your current medications as directed. Please refer to the Current Medication list given to you today.  Your physician recommends that you schedule a follow-up appointment in: no further cardiac work up needed. Please refer to primary care MD.

## 2013-11-12 ENCOUNTER — Other Ambulatory Visit (HOSPITAL_COMMUNITY): Payer: Self-pay | Admitting: Family Medicine

## 2013-11-12 NOTE — Telephone Encounter (Signed)
LMOVM for pt to call back.  Please schedule appt with Dr. Karie Schwalbe at his earliest convenience. Fleeger, Maryjo RochesterJessica Dawn

## 2014-01-09 ENCOUNTER — Other Ambulatory Visit: Payer: Self-pay | Admitting: Family Medicine

## 2014-01-17 ENCOUNTER — Telehealth: Payer: Self-pay | Admitting: Family Medicine

## 2014-01-17 NOTE — Telephone Encounter (Signed)
Port[facility] called requesting a refill on chlorproMAZINE. Please faxed RX to [phone]. Any questions, please call Portia at [phone].

## 2014-01-29 ENCOUNTER — Other Ambulatory Visit: Payer: Self-pay | Admitting: Family Medicine

## 2014-01-29 MED ORDER — CHLORPROMAZINE HCL 25 MG PO TABS: 25.0000 mg | ORAL_TABLET | Freq: Three times a day (TID) | ORAL | Status: DC | PRN

## 2014-01-29 NOTE — Telephone Encounter (Signed)
Will print and fax, but if more needed will need to re-eval.  Leona SingletonMaria T Yannely Kintzel, MD

## 2014-01-29 NOTE — Telephone Encounter (Signed)
Please see my refill encounter dated 01/29/2014 .  ,[name], MD

## 2014-02-26 ENCOUNTER — Ambulatory Visit (INDEPENDENT_AMBULATORY_CARE_PROVIDER_SITE_OTHER): Payer: Medicaid Other | Admitting: Family Medicine

## 2014-02-26 ENCOUNTER — Encounter: Payer: Self-pay | Admitting: Family Medicine

## 2014-02-26 VITALS — BP 115/85 | HR 88 | Temp 97.7°F | Ht 68.0 in | Wt 125.0 lb

## 2014-02-26 DIAGNOSIS — R066 Hiccough: Secondary | ICD-10-CM

## 2014-02-26 DIAGNOSIS — G40802 Other epilepsy, not intractable, without status epilepticus: Secondary | ICD-10-CM

## 2014-02-26 MED ORDER — METOCLOPRAMIDE HCL 10 MG PO TABS: 10.0000 mg | ORAL_TABLET | Freq: Three times a day (TID) | ORAL | Status: DC | PRN

## 2014-02-26 NOTE — Patient Instructions (Addendum)
Mr. Renae GlossShelton and family,  Thank you for coming in today. Dr. Karie Schwalbe will be back in the office on Friday May 1st and can sign the forms then.   Regarding hiccups with nausea.  Stop thorazine. Start reglan.  Physical maneuvers - Physical maneuvers include (table 2): ?Interrupt normal respiratory function (eg, breath holding, Valsalva maneuver) ?Stimulate nasopharynx or uvula (eg, sipping cold water, gargling with water, swallowing a teaspoon of dry sugar) ?Increase vagal stimulation (eg, pressing on the eyeballs) ?Counteract irritation of the diaphragm (eg, pulling knees to chest, leaning forward to compress the chest)   F/u with Dr. Karie Schwalbe to discuss hiccups and sign forms.   Dr. Armen PickupFunches

## 2014-02-26 NOTE — Progress Notes (Signed)
   Subjective:    Patient ID: Malik KocherRobert [name], male    DOB: 10-Aug-1966, 48 y.o.   MRN: [id number] CC: forms to sign, hiccups  HPI 48 yo M NH patient presents with family for the following:  1. NH forms: has forms for PCP to sign. Reports forms have been faxed 5 x w/o being signed and faxed back. Forms are MAR and orders.   2. Hiccups: chronic x 3-4 years. Recurrent now x 2 weeks. Associated with nausea w/o emesis. Taking thorazine prescribed in 10/14. Worked before. Not working now. No weight loss.   Soc hx: current smoker  Review of Systems As per HPI     Objective:   Physical Exam BP 115/85  Pulse 88  Temp(Src) 97.7 F (36.5 C) (Oral)  Ht 5\' 8"  (1.727 m)  Wt 125 lb (56.7 kg)  BMI 19.01 kg/m2 General appearance: alert, cooperative, no distress and thin, appears older than stated age  Throat: lips, mucosa, and tongue normal; teeth and gums normal Lungs: clear to auscultation bilaterally Abdomen: soft, non-tender; bowel sounds normal; no masses,  no organomegaly     Assessment & Plan:    1. NH forms: deferred to PCP. Informed them that PCP back on 5/1. Not willing to leave forms. Will return to have them signed.

## 2014-02-26 NOTE — Assessment & Plan Note (Signed)
A: chronic. Unsure etiology. Not assessed by PCP.  P: Regarding hiccups with nausea.  Stop thorazine. Start reglan. Counseled regarding limiting use to avoid tardive dyskinesia.   Physical maneuvers - Physical maneuvers include (table 2): ?Interrupt normal respiratory function (eg, breath holding, Valsalva maneuver) ?Stimulate nasopharynx or uvula (eg, sipping cold water, gargling with water, swallowing a teaspoon of dry sugar) ?Increase vagal stimulation (eg, pressing on the eyeballs) ?Counteract irritation of the diaphragm (eg, pulling knees to chest, leaning forward to compress the chest)

## 2014-03-01 ENCOUNTER — Ambulatory Visit (INDEPENDENT_AMBULATORY_CARE_PROVIDER_SITE_OTHER): Payer: Medicaid Other | Admitting: Family Medicine

## 2014-03-01 ENCOUNTER — Encounter: Payer: Self-pay | Admitting: Family Medicine

## 2014-03-01 VITALS — BP 133/89 | HR 96 | Temp 98.8°F

## 2014-03-01 DIAGNOSIS — E559 Vitamin D deficiency, unspecified: Secondary | ICD-10-CM

## 2014-03-01 DIAGNOSIS — E538 Deficiency of other specified B group vitamins: Secondary | ICD-10-CM

## 2014-03-01 DIAGNOSIS — R066 Hiccough: Secondary | ICD-10-CM

## 2014-03-01 DIAGNOSIS — Z789 Other specified health status: Secondary | ICD-10-CM

## 2014-03-01 LAB — VITAMIN B12: Vitamin B-12: 1599 pg/mL — ABNORMAL HIGH (ref 211–911)

## 2014-03-01 NOTE — Patient Instructions (Signed)
Great to see you.  Your facility will call me with amount of any as-needed medication clearly stated.  We are stopping chlorpromazine and you have reglan written as needed.  Stop/decrease spicy foods to see if this helps prevent hiccups. If you get hiccups, sopt spicy foods and sodas completely to see if hiccups go away. If not, follow up with previous neurologist.  Eat breakfast daily so you can stop Mighty Shakes.  Come see me soon to follow up on other issues.  Today we will check Vitamin D and B12 and I will call if NOT normal at [phone] and speak with supervisor.  Best,  Leona SingletonMaria T Sehaj Kolden, MD

## 2014-03-01 NOTE — Progress Notes (Signed)
Patient ID: Malik KocherRobert [name], male   DOB: [birth year], 48 y.o.   MRN: [id number] Subjective:   CC: Nursing home forms, f/u B12 and vitamin D  HPI:   Facility forms Patient and a worker at his facility bring in medication review forms that need to be signed today. Meds reviewed. Patient and facility staff member do not know how often he takes medications marked as PRN (norco, ibuprofen). See under "Meds" below.  Low B12 Patient takes B12 M-F. He is due for recheck.  Vitamin D deficiency Patient takes Vitamin D daily. He is due for recheck.  Hiccups Has had 3-4 years of hiccups lasting 10-14 days intermittently. Last visit, had switched from thorazine (which was not working) to reglan> Hiccups resolved prior to trying reglan. Hiccups were associated with nausea without emesis. Dr Armen PickupFunches also provided physical maneuvers.   Review of Systems - Per HPI.    Meds reviewed:  Unsure how often patient receives PRN meds (norco, ibuprofen).  Not needing tylenol or reglan.  Thorazine not working so stopped last visit and started reglan prn.    Objective:  Physical Exam BP 133/89  Pulse 96  Temp(Src) 98.8 F (37.1 C) (Oral) GEN: NAD HEENT: Atraumatic, normocephalic, neck supple, EOMI, sclera clear  PULM: normal effort ABD: Soft, nontender, nondistended SKIN: No rash or cyanosis; warm and well-perfused PSYCH: Mood and affect euthymic, normal rate and volume of speech NEURO: Awake, alert, seated in wheelchair, normal speech    Assessment:     Malik [name] is a 48 y.o. male here for forms and f/u of hiccups, vitamin D and B12.    Plan:     Facility forms - Filled out medication review - Call me with amount of prn medications patient needing on weekly basis (ibuprofen, norco).  Low B12  - Check lab today.  Vitamin D deficiency - Check lab today - Pt would like to cut out mighty shakes. Okay'ed this as long as he agrees to start eating breakfast.  Hiccups Possibly related to  carbonated beverages and spicy foods, also may be related to pt's MD. - Cut back on spicy foods - If develops hiccups, cut out spicy foods and carbonated beverages. - If no improvement wit this or physical maneuvers and reglan, we can set you up to see a neurologist.  Other issues for f/u - Needs HM visit. PHQ-9 at that time and falls risk assessment. - Need for statin: D/w preceptor with myotonic MD - Need for exercises out of bed even in wheelchair. Recommended PT eval. Previously, insurance would not cover.  - TB skin test at f/u (requested by facility).   Follow-up: Follow up in when convenient for patient for follow up on other issues.   Leona SingletonMaria T Oreste Majeed, MD [facility]

## 2014-03-02 ENCOUNTER — Telehealth (HOSPITAL_BASED_OUTPATIENT_CLINIC_OR_DEPARTMENT_OTHER): Payer: Self-pay | Admitting: Family Medicine

## 2014-03-02 LAB — VITAMIN D 25 HYDROXY (VIT D DEFICIENCY, FRACTURES): Vit D, 25-Hydroxy: 89 ng/mL (ref 30–89)

## 2014-03-02 NOTE — Telephone Encounter (Signed)
Please call and let patient know vitamin D is still normal and vitamin B12 level is elevated. He can stop taking both for now and we will need to recheck levels in 1-1.5 months in clinic to be sure levels are not significantly dropping.  Malik SingletonMaria T Amayia Ciano, MD

## 2014-03-04 DIAGNOSIS — Z789 Other specified health status: Secondary | ICD-10-CM | POA: Insufficient documentation

## 2014-03-04 NOTE — Telephone Encounter (Signed)
LM for patient to call back.  Please inform of message from Dr. Karie Schwalbe when he does.  Thanks Limited BrandsJazmin Hartsell,CMA

## 2014-03-04 NOTE — Assessment & Plan Note (Signed)
Possibly related to carbonated beverages and spicy foods, also may be related to pt's MD. - Cut back on spicy foods - If develops hiccups, cut out spicy foods and carbonated beverages. - If no improvement wit this or physical maneuvers and reglan, we can set you up to see a neurologist.

## 2014-03-04 NOTE — Assessment & Plan Note (Signed)
-   Filled out medication review - Call me with amount of prn medications patient needing on weekly basis (ibuprofen, norco).

## 2014-03-04 NOTE — Assessment & Plan Note (Signed)
-   Check lab today - Pt would like to cut out mighty shakes. Okay'ed this as long as he agrees to start eating breakfast.

## 2014-03-04 NOTE — Assessment & Plan Note (Signed)
Check lab today

## 2014-03-12 ENCOUNTER — Telehealth: Payer: Self-pay | Admitting: Family Medicine

## 2014-03-12 NOTE — Telephone Encounter (Signed)
Would like to talk to dr t about patient

## 2014-03-13 ENCOUNTER — Other Ambulatory Visit: Payer: Self-pay | Admitting: Family Medicine

## 2014-03-13 NOTE — Telephone Encounter (Signed)
Per Tammy patient doesn't take any prn medications, not taking his baths like he should.  No one has heard him with hiccups lately.  Rosann Gorum,CMA

## 2014-03-14 NOTE — Telephone Encounter (Signed)
Thank you.  Malik SingletonMaria T Yama Nielson, MD PGY-2, [facility]

## 2014-04-12 ENCOUNTER — Other Ambulatory Visit: Payer: Self-pay | Admitting: Family Medicine

## 2014-04-26 ENCOUNTER — Ambulatory Visit (INDEPENDENT_AMBULATORY_CARE_PROVIDER_SITE_OTHER): Payer: Medicaid Other | Admitting: Family Medicine

## 2014-04-26 ENCOUNTER — Encounter: Payer: Self-pay | Admitting: Family Medicine

## 2014-04-26 VITALS — BP 121/88 | HR 101 | Temp 98.3°F

## 2014-04-26 DIAGNOSIS — E559 Vitamin D deficiency, unspecified: Secondary | ICD-10-CM

## 2014-04-26 DIAGNOSIS — R066 Hiccough: Secondary | ICD-10-CM

## 2014-04-26 NOTE — Progress Notes (Signed)
Patient ID: Malik [name], male   DOB: [birth year], 48 y.o.   MRN: [id number] Subjective:   CC: Follow up vitamin D and B12, hiccups  HPI:   Follow up vitamin D and B12 deficiencies Malik MaduroRobert has been taking these vitamins weekly, despite my intending to d/c them after last check due to values at/above normal. He took B12 last Tuesday and D last Saturday. He was unaware that levels were normal.  Hiccups Restarted about 3 days ago and he has taken reglan twice with no improvement. He cut out spicy foods and has had to cut out [facility] (caffeine) due to cost. He has no abdominal pain but it is aggravating and fatiguing. He knows some of the physical maneuvers to reduce hiccups. He has not seen neurologist for this.    Review of Systems - Per HPI.   SH: Cut out [facility] due to cost.    Objective:  Physical Exam BP 121/88  Pulse 101  Temp(Src) 98.3 F (36.8 C) (Oral) GEN: NAD, pleasant NEURO: Awake, alert, seated in wheelchair, able to move upper and lower extremities but easily fatigues; occasional hiccup, able to talk through it PULM: Normal effort SKIN: No rash or cyanosis HEENT: AT/[place], sclera clear     Assessment:     Malik [name] is a 48 y.o. 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    Plan:     Follow up vitamin D and B12 deficiencies Last checked and were normal.  - Stop taking these for now. - Recheck levels in 1 month. - Message written to pt's facility.  Hiccups Returned. Last visit, reglan prescribed and discussed physical maneuvers. Planned for neuro f/u if returned and did not resolve with meds. - Continue trying reglan; reviewed possible SEs. - Reviewed physical maneuvers. - Call neurologist and let our office know if referral is needed. He has seen this neurologist within the past 2 years per pt.  # Health Maintenance: Not discussed  Follow-up: Follow up in 1 month for recheck B12 and Vitamin  D.    Malik SingletonMaria T [name], MD [facility]

## 2014-04-26 NOTE — Patient Instructions (Signed)
For your hiccups: - continue trying to do physical maneuvers discussed at a prior visit.Physical maneuvers include (table 2):  Interrupt normal respiratory function (eg, breath holding, Valsalva maneuver)  Stimulate nasopharynx or uvula (eg, sipping cold water, gargling with water, swallowing a teaspoon of dry sugar)  Increase vagal stimulation (eg, pressing on the eyeballs)  Counteract irritation of the diaphragm (eg, pulling knees to chest, leaning forward to compress the chest) - Take reglan every 8 hours as needed. Do not take long term. - Call your former neurologist for evaluation. If you need a referral, call my office.  For your vitamin D and B12, - Levels were normal at last check. Stop taking these for now and come back in 1 month for recheck.  You can continue taking Vitamin C.  I will try to call Tammy this weekend.   Best,  Leona SingletonMaria T [name], MD

## 2014-04-28 NOTE — Assessment & Plan Note (Signed)
Returned. Last visit, reglan prescribed and discussed physical maneuvers. Planned for neuro f/u if returned and did not resolve with meds. - Continue trying reglan; reviewed possible SEs. - Reviewed physical maneuvers. - Call neurologist and let our office know if referral is needed. He has seen this neurologist within the past 2 years per pt.

## 2014-04-28 NOTE — Assessment & Plan Note (Signed)
Last checked and were normal.  - Stop taking these for now. - Recheck levels in 1 month. - Message written to pt's facility.

## 2014-05-10 ENCOUNTER — Other Ambulatory Visit: Payer: Self-pay | Admitting: *Deleted

## 2014-05-10 ENCOUNTER — Telehealth: Payer: Self-pay | Admitting: Family Medicine

## 2014-05-10 MED ORDER — VITAMIN C 500 MG PO TABS: 500.0000 mg | ORAL_TABLET | Freq: Every day | ORAL | Status: DC

## 2014-05-10 NOTE — Telephone Encounter (Signed)
Spoke with Malik [name] (nurse at patient's residence [phone]) who had a few issues to discuss:  1. Medication update - Patient does not take ibuprofen 600mg  so she wanted this discontinued and order for ibuprofen 200mg  q6 hours PRN fever or pain.  - Order placed and faxed to [phone]).  2. Hygiene - She also reported Malik MaduroRobert is not showering, even weekly, even when he has accidents. This is a risk for ulcers and other infections.  - At next visit, will discuss importance of hygiene.  3. Activity and diet - She also reports he eats 1 meal daily and watches TV all day. He is drinking lots of mountain dews even though he states he is not.  - At next visit, will discuss diet and activity with patient again.  Malik SingletonMaria T Cherrise Occhipinti, MD

## 2014-05-10 NOTE — Telephone Encounter (Signed)
Refusing vitamin B12 because need to recheck level first. Discussed this with patient already at our last visit.  Leona SingletonMaria T Kelley Knoth, MD

## 2014-06-03 ENCOUNTER — Telehealth: Payer: Self-pay | Admitting: *Deleted

## 2014-06-03 DIAGNOSIS — G7111 Myotonic muscular dystrophy: Secondary | ICD-10-CM

## 2014-06-03 DIAGNOSIS — M6281 Muscle weakness (generalized): Secondary | ICD-10-CM

## 2014-06-03 DIAGNOSIS — R066 Hiccough: Secondary | ICD-10-CM

## 2014-06-03 NOTE — Telephone Encounter (Signed)
Referral placed.  Mordecai Tindol T Chima Astorino, MD  

## 2014-06-03 NOTE — Telephone Encounter (Signed)
Pt will need a new referral for neurology.  Will send message to MD so this can be placed. Jazmin Hartsell,CMA

## 2014-06-07 ENCOUNTER — Other Ambulatory Visit: Payer: Self-pay | Admitting: Family Medicine

## 2014-06-26 ENCOUNTER — Encounter: Payer: Self-pay | Admitting: Neurology

## 2014-06-26 ENCOUNTER — Ambulatory Visit (INDEPENDENT_AMBULATORY_CARE_PROVIDER_SITE_OTHER): Payer: Medicaid Other | Admitting: Neurology

## 2014-06-26 VITALS — BP 110/79 | HR 75 | Ht 68.0 in | Wt 125.0 lb

## 2014-06-26 DIAGNOSIS — I10 Essential (primary) hypertension: Secondary | ICD-10-CM | POA: Insufficient documentation

## 2014-06-26 DIAGNOSIS — G7111 Myotonic muscular dystrophy: Secondary | ICD-10-CM

## 2014-06-26 NOTE — Progress Notes (Signed)
PATIENT: Malik [name] DOB: 26-Dec-1965  HISTORICAL  Malik [name] is a 48 years old right-handed Caucasian male, accompanied by his father, referred by his primary care physicianTHEKKEKANDAM, [name] to follow up with his myotonic muscular dystrophy  I saw him initially in 2012, he presented with a lifelong history of rapid onset gait difficulty, during the evaluation, he was noted to have bilateral temporal wasting, boarding, significant facial diplegia, bilateral upper, and lower extremity weakness, consistent with mild chronic muscular dystrophy, evaluation also demonstrated B12 deficiency, level was 195, with elevated methylmalonic acid level 679  He later lost followup, per patient and his father, he was followed up in [facility] had a genetic testing around 2014, which has confirmed the diagnosis of myotonic muscular dystrophy, his mother, sister, likely has a disease as well, his mother passed away at a young age from cardiac issues, she has never had significant weakness  Patient is not married, does not have children, she had progressive worsening gait difficulty, fell, broke his right foot, left knee in February 2014, currently in assisted living, he could not even stand up with assistance anymore,  worsening dysarthria, but no significant dysphagia per patient  He denies significant pain, just want to keep a neurology followup,  Echocardiogram 2012 is demonstrated ejection fraction 40-45%, diffuse hypokinesia, EKG was normal   REVIEW OF SYSTEMS: Full 14 system review of systems performed and notable only for weakness, sleepiness, not enough sleep, fatigue, fever and chill  ALLERGIES: No Known Allergies  HOME MEDICATIONS: Current Outpatient Prescriptions on File Prior to Visit  Medication Sig Dispense Refill  . acetaminophen (TYLENOL) 500 MG tablet Take 2 tablets (1,000 mg total) by mouth every 6 (six) hours as needed for mild pain or fever.  30 tablet  0  .  hydrochlorothiazide (MICROZIDE) 12.5 MG capsule TAKE (1) CAPSULE BY MOUTH ONCE DAILY.  30 capsule  11  . HYDROcodone-acetaminophen (NORCO/VICODIN) 5-325 MG per tablet Take 2 tablets by mouth every 4 (four) hours as needed (for pain).      Marland Kitchen ibuprofen (ADVIL,MOTRIN) 600 MG tablet Take 600 mg by mouth every 8 (eight) hours as needed.      . metoCLOPramide (REGLAN) 10 MG tablet Take 1 tablet (10 mg total) by mouth every 8 (eight) hours as needed (hiccups and nausea).  30 tablet  0  . mirtazapine (REMERON) 15 MG tablet TAKE (1) TABLET BY MOUTH AT BEDTIME.  30 tablet  5  . Nutritional Supplements (NUTRITIONAL DRINK PO) Take 1 each by mouth 2 (two) times daily. Mighty Shake      . pantoprazole (PROTONIX) 40 MG tablet TAKE ONE TABLET BY MOUTH ONCE DAILY.  30 tablet  0  . vitamin B-12 (CYANOCOBALAMIN) 1000 MCG tablet TAKE 1 TABLET BY MOUTH MONDAY THRU FRIDAY ONLY.  30 tablet  1  . vitamin C (ASCORBIC ACID) 500 MG tablet Take 1 tablet (500 mg total) by mouth daily.  30 tablet  5  . Vitamin D, Ergocalciferol, (DRISDOL) 50000 UNITS CAPS Take 50,000 Units by mouth every Sunday.       No current facility-administered medications on file prior to visit.    PAST MEDICAL HISTORY: Past Medical History  Diagnosis Date  . Myotonic muscular dystrophy   . Hypertension   . Hiccups   . High blood pressure     PAST SURGICAL HISTORY: Past Surgical History  Procedure Laterality Date  . Multiple tooth extractions  2007    Pt had all teeth pulled, has not  yet gotten dentures.   . Cholecystectomy      FAMILY HISTORY: Family History  Problem Relation Age of Onset  . COPD Mother   . Diabetes Mother     SOCIAL HISTORY:  History   Social History  . Marital Status: Single    Spouse Name: N/A    Number of Children: 0  . Years of Education: GED   Occupational History  .      Disabled   Social History Main Topics  . Smoking status: Current Some Day Smoker -- 0.30 packs/day    Types: Cigarettes  .  Smokeless tobacco: Never Used     Comment: Pt states that he has not bought any since mid september, but will smoke if he finds one  . Alcohol Use: No  . Drug Use: No     Comment: Pt smokes MJ ocassinally.  Denies ever using IV drugs.   . Sexual Activity: Not Currently    Birth Control/ Protection: Condom     Comment: Pt states he always uses condomns when sexually active.    Other Topics Concern  . Not on file   Social History Narrative   Patient has stated to me that he wishes to be DNR/DNI.  We have discussed the slow progression that we anticipate with his myotonic muscular dystrophy, and he does not want interventions to artificially prolong his life such as trach/vent or feeding tube.  His father who is next of kin was present for this discussion and agrees, understand's Malik [name]'s wishes.  A traveling DNR was filled out 03/09/2013, and patient given information about living will and HCPOA.       CHAMBERLAIN,RACHEL, MD   03/09/2013         Patient lives in assisted living [facility].   Disabled.   Education GED.   Right handed.   Caffeine one cup of  Coffee daily.               PHYSICAL EXAM   Filed Vitals:   06/26/14 0947  BP: 110/79  Pulse: 75  Height:  (1.727 m)  Weight: 125 lb (56.7 kg)    Not recorded    Body mass index is 19.01 kg/(m^2).   Generalized: In no acute distress  Neck: Supple, no carotid bruits   Cardiac: Regular rate rhythm  Pulmonary: Clear to auscultation bilaterally  Musculoskeletal: No deformity  Neurological examination  Mentation: bald, nasal speech, slurred  Cranial nerve II-XII: Pupils were equal round reactive to light. Extraocular movements were full.  Visual field were full on confrontational test.  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  High arched hard palate, temporal wasting  Motor: Muscle wasting, bilateral upper extremity, proximal strength 3/5,  more distal finger flexion weakness 2/5, bilateral lower extremity proximal and distal 3/5.  Sensory: Intact to fine touch, pinprick,   Coordination: Normal finger to nose, heel-to-shin bilaterally there was no truncal ataxia  Gait: could not get up from seated position even with assistance  Deep tendon reflexes: Brachioradialis 2/2, biceps 2/2, triceps 2/2, patellar 2/2, Achilles 2/2, plantar responses were flexor bilaterally.   DIAGNOSTIC DATA (LABS, IMAGING, TESTING) - I reviewed patient records, labs, notes, testing and imaging myself where available.  Lab Results  Component Value Date   WBC 8.8 05/28/2013   HGB 11.8* 05/28/2013   HCT 34.2* 05/28/2013   MCV 91.2 05/28/2013   PLT 185 05/28/2013      Component Value  Date/Time   NA 143 05/28/2013 0530   K 4.0 05/28/2013 0530   CL 111 05/28/2013 0530   CO2 24 05/28/2013 0530   GLUCOSE 100* 05/28/2013 0530   BUN 7 05/28/2013 0530   CREATININE 0.51 05/28/2013 0530   CREATININE 0.49* 03/22/2012 1101   CALCIUM 8.6 05/28/2013 0530   PROT 5.5* 05/28/2013 0530   ALBUMIN 2.3* 05/28/2013 0530   AST 24 05/28/2013 0530   ALT 28 05/28/2013 0530   ALKPHOS 86 05/28/2013 0530   BILITOT 0.8 05/28/2013 0530   GFRNONAA >90 05/28/2013 0530   GFRAA >90 05/28/2013 0530   Lab Results  Component Value Date   CHOL 217* 07/25/2013   HDL 52 07/25/2013   LDLCALC 104* 07/25/2013   TRIG 303* 07/25/2013   CHOLHDL 4.2 07/25/2013   No results found for this basename: HGBA1C   Lab Results  Component Value Date   VITAMINB12 1599* 03/01/2014   Lab Results  Component Value Date   TSH 0.979 07/25/2013   ASSESSMENT AND PLAN  Carvel Huskins is a 48 y.o. male with genetic proven myotonic muscular dystrophy, strong family history of similar disease, presenting with progressive muscle weakness, gait difficulty, worsening dysarthria, also had a previous history of vitamin B12 deficiency  1. he was to continue followup with our clinic for his myotonic muscular dystrophy,  return to clinic in 9-12 months  2, he preferred his primary care  to manage his vitamin B12 deficiency for convenience reasons, suggested repeat B12, methylmalonic acid level, continue B12 intramuscular supplements if needed   3. PT/OT  Levert Feinstein, M.D. Ph.D.  [facility] [address] [place], [place] [address] [phone]

## 2014-07-09 ENCOUNTER — Other Ambulatory Visit: Payer: Self-pay | Admitting: Family Medicine

## 2014-08-02 ENCOUNTER — Ambulatory Visit: Payer: Medicaid Other

## 2014-08-09 ENCOUNTER — Other Ambulatory Visit: Payer: Self-pay | Admitting: Family Medicine

## 2014-09-06 ENCOUNTER — Other Ambulatory Visit: Payer: Self-pay | Admitting: Family Medicine

## 2014-09-06 NOTE — Telephone Encounter (Signed)
The [facility] Pharmacist called requesting refill for reglan 10 mg.  Clovis PuMartin, Malaika Arnall L, RN

## 2014-10-07 ENCOUNTER — Other Ambulatory Visit: Payer: Self-pay | Admitting: Family Medicine

## 2014-10-07 MED ORDER — IBUPROFEN 200 MG PO TABS: 200.0000 mg | ORAL_TABLET | Freq: Four times a day (QID) | ORAL | Status: DC | PRN

## 2014-11-22 ENCOUNTER — Emergency Department (HOSPITAL_COMMUNITY): Payer: Medicaid Other

## 2014-11-22 ENCOUNTER — Emergency Department (HOSPITAL_COMMUNITY)
Admission: EM | Admit: 2014-11-22 | Discharge: 2014-11-22 | Disposition: A | Discharge status: Home / Self Care | Payer: Medicaid Other | Attending: Emergency Medicine | Admitting: Emergency Medicine

## 2014-11-22 ENCOUNTER — Encounter (HOSPITAL_COMMUNITY): Payer: Self-pay | Admitting: Emergency Medicine

## 2014-11-22 DIAGNOSIS — N201 Calculus of ureter: Secondary | ICD-10-CM | POA: Insufficient documentation

## 2014-11-22 DIAGNOSIS — Z720 Tobacco use: Secondary | ICD-10-CM | POA: Insufficient documentation

## 2014-11-22 DIAGNOSIS — Z8669 Personal history of other diseases of the nervous system and sense organs: Secondary | ICD-10-CM | POA: Insufficient documentation

## 2014-11-22 DIAGNOSIS — Z79899 Other long term (current) drug therapy: Secondary | ICD-10-CM | POA: Diagnosis not present

## 2014-11-22 DIAGNOSIS — Z9049 Acquired absence of other specified parts of digestive tract: Secondary | ICD-10-CM | POA: Insufficient documentation

## 2014-11-22 DIAGNOSIS — I10 Essential (primary) hypertension: Secondary | ICD-10-CM | POA: Diagnosis not present

## 2014-11-22 DIAGNOSIS — N23 Unspecified renal colic: Secondary | ICD-10-CM

## 2014-11-22 DIAGNOSIS — K5900 Constipation, unspecified: Secondary | ICD-10-CM | POA: Diagnosis present

## 2014-11-22 LAB — URINALYSIS, ROUTINE W REFLEX MICROSCOPIC
Bilirubin Urine: NEGATIVE
Glucose, UA: NEGATIVE mg/dL
Ketones, ur: NEGATIVE mg/dL
Leukocytes, UA: NEGATIVE
Nitrite: NEGATIVE
Protein, ur: NEGATIVE mg/dL
Specific Gravity, Urine: 1.022 (ref 1.005–1.030)
Urobilinogen, UA: 1 mg/dL (ref 0.0–1.0)
pH: 6 (ref 5.0–8.0)

## 2014-11-22 LAB — COMPREHENSIVE METABOLIC PANEL
ALT: 32 U/L (ref 0–53)
AST: 35 U/L (ref 0–37)
Albumin: 3.9 g/dL (ref 3.5–5.2)
Alkaline Phosphatase: 136 U/L — ABNORMAL HIGH (ref 39–117)
Anion gap: 14 (ref 5–15)
BUN: 14 mg/dL (ref 6–23)
CO2: 31 mmol/L (ref 19–32)
Calcium: 10.1 mg/dL (ref 8.4–10.5)
Chloride: 98 mmol/L (ref 96–112)
Creatinine, Ser: 0.68 mg/dL (ref 0.50–1.35)
GFR calc Af Amer: >90 mL/min (ref >90)
GFR calc non Af Amer: >90 mL/min (ref >90)
Glucose, Bld: 112 mg/dL — ABNORMAL HIGH (ref 70–99)
Potassium: 3.1 mmol/L — ABNORMAL LOW (ref 3.5–5.1)
Sodium: 143 mmol/L (ref 135–145)
Total Bilirubin: 0.6 mg/dL (ref 0.3–1.2)
Total Protein: 7.8 g/dL (ref 6.0–8.3)

## 2014-11-22 LAB — CBC WITH DIFFERENTIAL/PLATELET
Basophils Absolute: 0 10*3/uL (ref 0.0–0.1)
Basophils Relative: 0 % (ref 0–1)
Eosinophils Absolute: 0 10*3/uL (ref 0.0–0.7)
Eosinophils Relative: 0 % (ref 0–5)
HCT: 47.4 % (ref 39.0–52.0)
Hemoglobin: 16.3 g/dL (ref 13.0–17.0)
Lymphocytes Relative: 13 % (ref 12–46)
Lymphs Abs: 1.4 10*3/uL (ref 0.7–4.0)
MCH: 31.7 pg (ref 26.0–34.0)
MCHC: 34.4 g/dL (ref 30.0–36.0)
MCV: 92 fL (ref 78.0–100.0)
Monocytes Absolute: 0.7 10*3/uL (ref 0.1–1.0)
Monocytes Relative: 7 % (ref 3–12)
Neutro Abs: 8.2 10*3/uL — ABNORMAL HIGH (ref 1.7–7.7)
Neutrophils Relative %: 80 % — ABNORMAL HIGH (ref 43–77)
Platelets: 368 10*3/uL (ref 150–400)
RBC: 5.15 MIL/uL (ref 4.22–5.81)
RDW: 15.3 % (ref 11.5–15.5)
WBC: 10.3 10*3/uL (ref 4.0–10.5)

## 2014-11-22 LAB — POC OCCULT BLOOD, ED: Fecal Occult Bld: NEGATIVE

## 2014-11-22 LAB — URINE MICROSCOPIC-ADD ON

## 2014-11-22 MED ORDER — OXYCODONE-ACETAMINOPHEN 5-325 MG PO TABS: 1.0000 | ORAL_TABLET | ORAL | Status: DC | PRN

## 2014-11-22 NOTE — ED Notes (Signed)
Bed: WA20 Expected date:  Expected time:  Means of arrival:  Comments: EMS- urinary retention and constipation x 2 days

## 2014-11-22 NOTE — ED Notes (Signed)
PER [facility] [place] [facility] [place] [facility] [place]/o constipation and urinary retention x2 days, "dribbling".  A+Ox4.

## 2014-11-22 NOTE — ED Notes (Signed)
Initial Contact - pt awake, alert, reports constipated and unable to urinate x2 days.  Pt also asking at this time to go to the bathroom, pt assisted to use bedpan, with no results.  Pt able to void in urinal, denies dysuria.  Skin PWD.  MAEI, self repositioning for comfort.  Speaking full/clear sentences, rr even/un-lab.  Abd s/nt/nd.  Denies n/v.  NAD.

## 2014-11-22 NOTE — Discharge Instructions (Signed)
Straining your urine, to catch the stone. Take the pain medicine regularly to prevent recurrence of the severe pain. Follow-up with the urologist for checkup in 3 or 4 days.   Kidney Stones Kidney stones (urolithiasis) are deposits that form inside your kidneys. The intense pain is caused by the stone moving through the urinary tract. When the stone moves, the ureter goes into spasm around the stone. The stone is usually passed in the urine.  CAUSES   A disorder that makes certain neck glands produce too much parathyroid hormone (primary hyperparathyroidism).  A buildup of uric acid crystals, similar to gout in your joints.  Narrowing (stricture) of the ureter.  A kidney obstruction present at birth (congenital obstruction).  Previous surgery on the kidney or ureters.  Numerous kidney infections. SYMPTOMS   Feeling sick to your stomach (nauseous).  Throwing up (vomiting).  Blood in the urine (hematuria).  Pain that usually spreads (radiates) to the groin.  Frequency or urgency of urination. DIAGNOSIS   Taking a history and physical exam.  Blood or urine tests.  CT scan.  Occasionally, an examination of the inside of the urinary bladder (cystoscopy) is performed. TREATMENT   Observation.  Increasing your fluid intake.  Extracorporeal shock wave lithotripsy--This is a noninvasive procedure that uses shock waves to break up kidney stones.  Surgery may be needed if you have severe pain or persistent obstruction. There are various surgical procedures. Most of the procedures are performed with the use of small instruments. Only small incisions are needed to accommodate these instruments, so recovery time is minimized. The size, location, and chemical composition are all important variables that will determine the proper choice of action for you. Talk to your health care provider to better understand your situation so that you will minimize the risk of injury to yourself and  your kidney.  HOME CARE INSTRUCTIONS   Drink enough water and fluids to keep your urine clear or pale yellow. This will help you to pass the stone or stone fragments.  Strain all urine through the provided strainer. Keep all particulate matter and stones for your health care provider to see. The stone causing the pain may be as small as a grain of salt. It is very important to use the strainer each and every time you pass your urine. The collection of your stone will allow your health care provider to analyze it and verify that a stone has actually passed. The stone analysis will often identify what you can do to reduce the incidence of recurrences.  Only take over-the-counter or prescription medicines for pain, discomfort, or fever as directed by your health care provider.  Make a follow-up appointment with your health care provider as directed.  Get follow-up X-rays if required. The absence of pain does not always mean that the stone has passed. It may have only stopped moving. If the urine remains completely obstructed, it can cause loss of kidney function or even complete destruction of the kidney. It is your responsibility to make sure X-rays and follow-ups are completed. Ultrasounds of the kidney can show blockages and the status of the kidney. Ultrasounds are not associated with any radiation and can be performed easily in a matter of minutes. SEEK MEDICAL CARE IF:  You experience pain that is progressive and unresponsive to any pain medicine you have been prescribed. SEEK IMMEDIATE MEDICAL CARE IF:   Pain cannot be controlled with the prescribed medicine.  You have a fever or shaking chills.  The  severity or intensity of pain increases over 18 hours and is not relieved by pain medicine.  You develop a new onset of abdominal pain.  You feel faint or pass out.  You are unable to urinate. MAKE SURE YOU:   Understand these instructions.  Will watch your condition.  Will get help  right away if you are not doing well or get worse. Document Released: 10/18/2005 Document Revised: 06/20/2013 Document Reviewed: 03/21/2013 [facility] Patient Information 2015 [place], [place]. This information is not intended to replace advice given to you by your health care provider. Make sure you discuss any questions you have with your health care provider.

## 2014-11-22 NOTE — ED Notes (Signed)
[facility] contacted to arrange transport for pt back to facility

## 2014-11-22 NOTE — ED Notes (Signed)
Bladder scan showed 27ml notified doctor Effie Shywentz

## 2014-11-22 NOTE — ED Provider Notes (Signed)
CSN: [id number]     Arrival date & time 11/22/14  1058 History   First MD Initiated Contact with Patient 11/22/14 1104     Chief Complaint  Patient presents with  . Urinary Retention    x2 days  . Constipation    x2 days     (Consider location/radiation/quality/duration/timing/severity/associated sxs/prior Treatment) HPI   Malik [name] is a 49 y.o. male who presents for evaluation of difficulty urinating, "only dribbling", for 2 days.  He had a normal bowel movement 2 days ago, but has not had one since.  He feels urgency to stool but is unable to.  He has had hiccups, but no nausea, vomiting, cough, shortness of breath or chest pain.  He has had nonproductive cough, but no chest pain or back pain.  He came here for evaluation, by EMS.  He lives in a "retirement community".  There are no other known modifying factors.   Past Medical History  Diagnosis Date  . Myotonic muscular dystrophy   . Hypertension   . Hiccups   . High blood pressure    Past Surgical History  Procedure Laterality Date  . Multiple tooth extractions  2007    Pt had all teeth pulled, has not yet gotten dentures.   . Cholecystectomy     Family History  Problem Relation Age of Onset  . COPD Mother   . Diabetes Mother    History  Substance Use Topics  . Smoking status: Current Some Day Smoker -- 0.30 packs/day    Types: Cigarettes  . Smokeless tobacco: Never Used     Comment: Pt states that he has not bought any since mid september, but will smoke if he finds one  . Alcohol Use: No    Review of Systems  All other systems reviewed and are negative.     Allergies  Review of patient's allergies indicates no known allergies.  Home Medications   Prior to Admission medications   Medication Sig Start Date End Date Taking? Authorizing Provider  hydrochlorothiazide (MICROZIDE) 12.5 MG capsule TAKE (1) CAPSULE BY MOUTH ONCE DAILY.   Yes Leona SingletonMaria T [name], MD  ibuprofen (ADVIL,MOTRIN) 200 MG tablet  Take 1 tablet (200 mg total) by mouth every 6 (six) hours as needed. Patient taking differently: Take 200 mg by mouth every 6 (six) hours as needed for fever (and pain).  10/07/14  Yes Leona SingletonMaria T [name], MD  metoCLOPramide (REGLAN) 10 MG tablet TAKE 1 TABLET BY MOUTH EVERY 8 HOURS AS NEEDED FOR HICCUPS/NAUSEA. 09/06/14  Yes Leona SingletonMaria T [name], MD  mirtazapine (REMERON) 15 MG tablet TAKE (1) TABLET BY MOUTH AT BEDTIME.   Yes Leona SingletonMaria T [name], MD  Nutritional Supplements (NUTRITIONAL DRINK PO) Take 1 each by mouth 2 (two) times daily as needed (for nutrition). Mighty Shake   Yes Historical Provider, MD  pantoprazole (PROTONIX) 40 MG tablet TAKE ONE TABLET BY MOUTH ONCE DAILY. 08/09/14  Yes Leona SingletonMaria T [name], MD  vitamin C (ASCORBIC ACID) 500 MG tablet Take 1 tablet (500 mg total) by mouth daily. 05/10/14  Yes Leona SingletonMaria T [name], MD   SpO2 95% Physical Exam  Constitutional: He is oriented to person, place, and time. He appears well-developed.  He appears under nourished  HENT:  Head: Normocephalic and atraumatic.  Right Ear: External ear normal.  Left Ear: External ear normal.  Moist oral mucous membranes.  Eyes: Conjunctivae and EOM are normal. Pupils are equal, round, and reactive to light.  Neck: Normal range of motion and  phonation normal. Neck supple.  Cardiovascular: Normal rate, regular rhythm and normal heart sounds.   Pulmonary/Chest: Effort normal and breath sounds normal. He exhibits no bony tenderness.  Abdominal: Soft. He exhibits no mass. There is tenderness (Diffuse, mild). There is no rebound and no guarding.  Genitourinary:  No stool in rectal vault, no mass or apparent bleeding.  Musculoskeletal: Normal range of motion.  Neurological: He is alert and oriented to person, place, and time. No cranial nerve deficit or sensory deficit. He exhibits normal muscle tone. Coordination normal.  Skin: Skin is warm, dry and intact.  Psychiatric: He has a normal mood and affect.  His behavior is normal. Judgment and thought content normal.  Nursing note and vitals reviewed.   ED Course  Procedures (including critical care time)  Medications - No data to display  Patient Vitals for the past 24 hrs:  BP Temp Temp src Pulse Resp SpO2  11/22/14 1430 99/65 mmHg - - 78 - 92 %  11/22/14 1417 103/67 mmHg - - 81 - 96 %  11/22/14 1315 108/73 mmHg 97.8 F (36.6 C) Oral 72 20 96 %  11/22/14 1058 - - - - - 95 %    2:58 PM Reevaluation with update and discussion. After initial assessment and treatment, an updated evaluation reveals he is comfortable now.  He feels better after having a bowel movement in the emergency department.  He states that he has had a kidney stone in the past that had to be "dissolved".[name]    Labs Review Labs Reviewed - No data to display  Imaging Review No results found.   EKG Interpretation None      MDM   Final diagnoses:  Left ureteral stone  Ureteral colic    Left ureteral colic causing pain, and sensation of inability to void.  There is no evidence for urinary retention, or urinary tract infection.  Nursing Notes Reviewed/ Care Coordinated Applicable Imaging Reviewed Interpretation of Laboratory Data incorporated into ED treatment  The patient appears reasonably screened and/or stabilized for discharge and I doubt any other medical condition or other [facility] requiring further screening, evaluation, or treatment in the ED at this time prior to discharge.  Plan: Home Medications- Percocet; Home Treatments- Strain Urine; return here if the recommended treatment, does not improve the symptoms; Recommended follow up- Urology f/u 3-4 days   Flint Melter, MD 11/22/14 1459

## 2014-11-22 NOTE — ED Notes (Signed)
Pt to radiology.

## 2014-11-22 NOTE — ED Notes (Signed)
Pt to CT

## 2014-11-23 LAB — URINE CULTURE
Colony Count: NO GROWTH
Culture: NO GROWTH
Special Requests: NORMAL

## 2014-12-13 ENCOUNTER — Other Ambulatory Visit: Payer: Self-pay | Admitting: *Deleted

## 2014-12-13 MED ORDER — VITAMIN C 500 MG PO TABS: 500.0000 mg | ORAL_TABLET | Freq: Every day | ORAL | Status: DC

## 2015-03-05 ENCOUNTER — Other Ambulatory Visit: Payer: Self-pay | Admitting: *Deleted

## 2015-03-06 MED ORDER — POTASSIUM CHLORIDE ER 20 MEQ PO TBCR: 40.0000 meq | EXTENDED_RELEASE_TABLET | Freq: Every day | ORAL | Status: DC

## 2015-03-06 MED ORDER — HYDROCHLOROTHIAZIDE 12.5 MG PO CAPS: ORAL_CAPSULE | ORAL | Status: DC

## 2015-03-06 MED ORDER — MIRTAZAPINE 15 MG PO TABS: ORAL_TABLET | ORAL | Status: DC

## 2015-03-06 MED ORDER — PANTOPRAZOLE SODIUM 40 MG PO TBEC: 40.0000 mg | DELAYED_RELEASE_TABLET | Freq: Every day | ORAL | Status: DC

## 2015-03-06 NOTE — Addendum Note (Signed)
Addended by: Simone CuriaHEKKEKANDAM, Natalija Mavis T on: 03/06/2015 09:52 AM   Modules accepted: Orders

## 2015-03-06 NOTE — Telephone Encounter (Signed)
Letter mailed to patient to follow up. Brixton Franko,CMA

## 2015-03-06 NOTE — Telephone Encounter (Addendum)
Please notify patient that refilling HCTZ, mirtazapine, and pantoprazole but at follow up patient needs repeat BMET and CBC for monitoring with HCTZ and mirtazapine. He should also take potassium supplement for 3-4 days. Will send in.   Malik SingletonMaria T [name] Julia, MD

## 2015-04-28 ENCOUNTER — Ambulatory Visit (INDEPENDENT_AMBULATORY_CARE_PROVIDER_SITE_OTHER): Payer: Medicaid Other | Admitting: Neurology

## 2015-04-28 ENCOUNTER — Encounter: Payer: Self-pay | Admitting: Neurology

## 2015-04-28 VITALS — BP 108/76 | HR 62 | Ht 68.0 in | Wt 118.0 lb

## 2015-04-28 DIAGNOSIS — E538 Deficiency of other specified B group vitamins: Secondary | ICD-10-CM

## 2015-04-28 DIAGNOSIS — G7111 Myotonic muscular dystrophy: Secondary | ICD-10-CM

## 2015-04-28 NOTE — Progress Notes (Signed)
Chief Complaint  Patient presents with  . Myotonic Muscular Dystrophy    Feels he has become weaker and can only stand for a few minutes at a time.  He is no longer in PT or OT.      PATIENT: Malik [name] DOB: [birth year]  HISTORICAL  Malik [name] is a 49 years old right-handed Caucasian male, accompanied by his father, referred by his primary care physicianTHEKKEKANDAM, Malik T to follow up with his myotonic muscular dystrophy  I saw him initially in 2012, he presented with a lifelong history of gradual onset gait difficulty, during the evaluation, he was noted to have bilateral temporal wasting, bald, significant facial diplegia, bilateral upper and lower extremity weakness, consistent with myotonic muscular dystrophy, evaluation also demonstrated B12 deficiency, level was 195, with elevated methylmalonic acid level 679  He later lost followup, per patient and his father, he was followed up in [facility] had a genetic testing around 2014, which has confirmed the diagnosis of myotonic muscular dystrophy, his mother and sister, likely has a disease as well, his mother passed away at a young age from cardiac issues, she has never had significant weakness  Patient is not married, does not have children, hee had progressive worsening gait difficulty, fell, broke his right foot, left knee in February 2014, currently in assisted living, he could not even stand up with assistance anymore,  worsening dysarthria, but no significant dysphagia per patient  He denies significant pain, just want to keep a neurology followup,  Echocardiogram 2012: demonstrated ejection fraction 40-45%, diffuse hypokinesia, EKG was normal  UPDATE June 27th 2016:  Last visit was in August 2015, he was dropped by her nursing home facility at today's visit, he is alone, I reviewed his medication list, including hydrochlorothiazide, atorvastatin, mirtazapine, pantoprazole, oxycodone as needed, ibuprofen as needed,  He  can no longer walk,he continue reported progressive weakness, worsening dysarthria, he reported good appetite, no swallowing difficulty, has excessive daytime sleepiness ,mild weak cough  REVIEW OF SYSTEMS: Full 14 system review of systems performed and notable only for fatigue, daytime sleepiness, weakness  ALLERGIES: No Known Allergies  HOME MEDICATIONS: Current Outpatient Prescriptions on File Prior to Visit  Medication Sig Dispense Refill  . hydrochlorothiazide (MICROZIDE) 12.5 MG capsule TAKE (1) CAPSULE BY MOUTH ONCE DAILY. Needs PCP follow up. 30 capsule 1  . ibuprofen (ADVIL,MOTRIN) 200 MG tablet Take 1 tablet (200 mg total) by mouth every 6 (six) hours as needed. (Patient taking differently: Take 200 mg by mouth every 6 (six) hours as needed for fever (and pain). ) 60 tablet 2  . metoCLOPramide (REGLAN) 10 MG tablet TAKE 1 TABLET BY MOUTH EVERY 8 HOURS AS NEEDED FOR HICCUPS/NAUSEA. 30 tablet 0  . mirtazapine (REMERON) 15 MG tablet TAKE (1) TABLET BY MOUTH AT BEDTIME. Needs PCP follow up. 30 tablet 0  . Nutritional Supplements (NUTRITIONAL DRINK PO) Take 1 each by mouth 2 (two) times daily as needed (for nutrition). Mighty Shake    . oxyCODONE-acetaminophen (PERCOCET) 5-325 MG per tablet Take 1 tablet by mouth every 4 (four) hours as needed for severe pain. 20 tablet 0  . pantoprazole (PROTONIX) 40 MG tablet Take 1 tablet (40 mg total) by mouth daily. 30 tablet 2   No current facility-administered medications on file prior to visit.    PAST MEDICAL HISTORY: Past Medical History  Diagnosis Date  . Myotonic muscular dystrophy   . Hypertension   . Hiccups   . High blood pressure  PAST SURGICAL HISTORY: Past Surgical History  Procedure Laterality Date  . Multiple tooth extractions  2007    Pt had all teeth pulled, has not yet gotten dentures.   . Cholecystectomy      FAMILY HISTORY: Family History  Problem Relation Age of Onset  . COPD Mother   . Diabetes Mother      SOCIAL HISTORY:  History   Social History  . Marital Status: Single    Spouse Name: N/A  . Number of Children: 0  . Years of Education: GED   Occupational History  .      Disabled   Social History Main Topics  . Smoking status: Current Some Day Smoker -- 0.30 packs/day    Types: Cigarettes  . Smokeless tobacco: Never Used     Comment: Pt states that he has not bought any since mid september, but will smoke if he finds one  . Alcohol Use: No  . Drug Use: No     Comment: Pt smokes MJ ocassinally.  Denies ever using IV drugs.   . Sexual Activity: Not Currently    Birth Control/ Protection: Condom     Comment: Pt states he always uses condomns when sexually active.    Other Topics Concern  . Not on file   Social History Narrative   Patient has stated to me that he wishes to be DNR/DNI.  We have discussed the slow progression that we anticipate with his myotonic muscular dystrophy, and he does not want interventions to artificially prolong his life such as trach/vent or feeding tube.  His father who is next of kin was present for this discussion and agrees, understand's Adelfo's wishes.  A traveling DNR was filled out 03/09/2013, and patient given information about living will and HCPOA.       CHAMBERLAIN,RACHEL, MD   03/09/2013         Patient lives in assisted living [facility].   Disabled.   Education GED.   Right handed.   Caffeine one cup of  Coffee daily.               PHYSICAL EXAM   Filed Vitals:   04/28/15 1156  BP: 108/76  Pulse: 62  Height:  (1.727 m)  Weight: 118 lb (53.524 kg)    Not recorded      Body mass index is 17.95 kg/(m^2).   Generalized: In no acute distress  Neck: Supple, no carotid bruits   Cardiac: Regular rate rhythm  Pulmonary: Clear to auscultation bilaterally  Musculoskeletal: No deformity  Neurological examination  Mentation: bald, nasal speech, slurred  Cranial nerve II-XII: Pupils were equal  round reactive to light. Extraocular movements were full.  Visual field were full on confrontational test.  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  High arched hard palate, temporal wasting  Motor: Muscle wasting, bilateral upper extremity, proximal strength 3/5, more distal finger flexion weakness 2/5, bilateral lower extremity proximal and distal 3/5.  Sensory: Intact to fine touch, pinprick,   Coordination: Normal finger to nose, heel-to-shin bilaterally there was no truncal ataxia  Gait: could not get up from seated position even with assistance, able to move his wheelchair by padding with his legs.  Deep tendon reflexes: Brachioradialis 2/2, biceps 2/2, triceps 2/2, patellar 2/2, Achilles 2/2, plantar responses were flexor bilaterally.   DIAGNOSTIC DATA (LABS, IMAGING, TESTING) - I reviewed patient records, labs, notes, testing and imaging myself where available.  Lab Results  Component Value Date   WBC 10.3 11/22/2014   HGB 16.3 11/22/2014   HCT 47.4 11/22/2014   MCV 92.0 11/22/2014   PLT 368 11/22/2014      Component Value Date/Time   NA 143 11/22/2014 1134   K 3.1* 11/22/2014 1134   CL 98 11/22/2014 1134   CO2 31 11/22/2014 1134   GLUCOSE 112* 11/22/2014 1134   BUN 14 11/22/2014 1134   CREATININE 0.68 11/22/2014 1134   CREATININE 0.49* 03/22/2012 1101   CALCIUM 10.1 11/22/2014 1134   PROT 7.8 11/22/2014 1134   ALBUMIN 3.9 11/22/2014 1134   AST 35 11/22/2014 1134   ALT 32 11/22/2014 1134   ALKPHOS 136* 11/22/2014 1134   BILITOT 0.6 11/22/2014 1134   GFRNONAA >90 11/22/2014 1134   GFRAA >90 11/22/2014 1134   Lab Results  Component Value Date   CHOL 217* 07/25/2013   HDL 52 07/25/2013   LDLCALC 104* 07/25/2013   TRIG 303* 07/25/2013   CHOLHDL 4.2 07/25/2013   No results found for: HGBA1C Lab Results  Component Value Date   VITAMINB12 1599* 03/01/2014   Lab Results  Component Value Date   TSH 0.979  07/25/2013   ASSESSMENT AND PLAN  Konrad Desai is a 49 y.o. male with genetic proven myotonic muscular dystrophy, strong family history of similar disease, presenting with progressive muscle weakness, gait difficulty, worsening dysarthria, also had a previous history of vitamin B12 deficiency  1.myotonic muscular dystrophy, slow progression muscle weakness  2,  vitamin B12 deficiency, continue B12 injection by his  primary care office  Return to clinic for new symptoms Levert Feinstein, M.D. Ph.D.  [facility] [address], [address] [place], [place] [address] [phone]

## 2016-01-27 ENCOUNTER — Other Ambulatory Visit: Payer: Self-pay | Admitting: Family Medicine

## 2016-01-27 ENCOUNTER — Ambulatory Visit (INDEPENDENT_AMBULATORY_CARE_PROVIDER_SITE_OTHER): Payer: Medicaid Other | Admitting: Family Medicine

## 2016-01-27 ENCOUNTER — Encounter: Payer: Self-pay | Admitting: Family Medicine

## 2016-01-27 VITALS — BP 98/73 | HR 99 | Temp 97.6°F

## 2016-01-27 DIAGNOSIS — R590 Localized enlarged lymph nodes: Secondary | ICD-10-CM

## 2016-01-27 DIAGNOSIS — M79605 Pain in left leg: Secondary | ICD-10-CM | POA: Diagnosis not present

## 2016-01-27 MED ORDER — IBUPROFEN 200 MG PO TABS: 400.0000 mg | ORAL_TABLET | Freq: Four times a day (QID) | ORAL | Status: DC | PRN

## 2016-01-27 NOTE — Progress Notes (Signed)
Patient ID: Malik [name], male   DOB: 1966/03/27, 50 y.o.   MRN: [id number]   [name] Yolande Jolly, MD Phone: [phone]  Subjective:   # Lymphadenopathy , Left Axilla - has felt like his left axilla has been full for about two month.  - denies night sweats.  - seven pounds down last year, but no weight today.  - says his weight at the facility was 118lbs. - has not had any recent infections. No congestion, sinus infections.  - has intermittent diarrhea. No constipation  - Endorses increased fatigue.  - 25 pck/yr history.  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 Has not drank ETOH in over 10 years.  - No shortness of breath, is not having hemoptysis.  - No other aches or pains anywhere.  - denies nausea.  - Had a rash on his arms a couple of months ago, that was itchy and just red.  - Has a strong family history with Multiple forms of cancer in multiple first degree, and second degree relatives including lymphoma, breast cancer, thyroid cancer, lung cancer (thought to be non-small cell lung cancer).   All relevant systems were reviewed and were negative unless otherwise noted in the HPI  Past Medical History Reviewed problem list.  Medications- reviewed and updated Current Outpatient Prescriptions  Medication Sig Dispense Refill  . atorvastatin (LIPITOR) 40 MG tablet Take 40 mg by mouth daily.    . fenofibrate (TRICOR) 145 MG tablet Take 145 mg by mouth daily.    . hydrochlorothiazide (MICROZIDE) 12.5 MG capsule TAKE (1) CAPSULE BY MOUTH ONCE DAILY. Needs PCP follow up. 30 capsule 1  . ibuprofen (ADVIL,MOTRIN) 200 MG tablet Take 1 tablet (200 mg total) by mouth every 6 (six) hours as needed. (Patient taking differently: Take 200 mg by mouth every 6 (six) hours as needed for fever (and pain). ) 60 tablet 2  . metoCLOPramide (REGLAN) 10 MG tablet TAKE 1 TABLET BY MOUTH EVERY 8 HOURS AS NEEDED FOR HICCUPS/NAUSEA. 30 tablet 0  . mirtazapine  (REMERON) 15 MG tablet TAKE (1) TABLET BY MOUTH AT BEDTIME. Needs PCP follow up. 30 tablet 0  . Nutritional Supplements (NUTRITIONAL DRINK PO) Take 1 each by mouth 2 (two) times daily as needed (for nutrition). Mighty Shake    . oxyCODONE-acetaminophen (PERCOCET) 5-325 MG per tablet Take 1 tablet by mouth every 4 (four) hours as needed for severe pain. 20 tablet 0  . pantoprazole (PROTONIX) 40 MG tablet Take 1 tablet (40 mg total) by mouth daily. 30 tablet 2   No current facility-administered medications for this visit.   Chief complaint-noted No additions to family history Social history- patient is a current 1/2ppd smoker  Objective: BP 98/73 mmHg  Pulse 99  Temp(Src) 97.6 F (36.4 C) (Oral) Gen: NAD, alert, cooperative with exam HEENT: NCAT, EOMI, PERRL, O/P clear, no lymph nodes palpable in cervical, submandibular, or mental chain. No occipital nodes palpable.  Neck: FROM, supple, no thyromegaly, no LAD.  CV: RRR, good S1/S2, no murmur Resp: CTABL, no wheezes, non-labored Abd: SNTND, BS present, no guarding or organomegaly, no fullness.  Ext: Lymphadenopathy palpable with one enlarged lymph node on the left, node is nontender, not swollen, and no erythema overlying, not matted node palpable, Right side without lymphadenopathy. No edema, warm, normal tone, moves UE/LE spontaneously Neuro: Alert and oriented, No gross deficits Skin: no rashes no lesions today.   Assessment/Plan:  # Lymphadenopathy - concerning in pt. With  strong family history and risk factors for neoplasia. He has no notable B symptoms at this time, though difficult to tell with chronic fatigue, and mobility state. He does have a history of muscular dystrophy.  - Today will start with routine blood work.  - CBC with differential - CMET - Sed Rate - Ultrasound of the axilla - Chest x-ray - Consider biopsy pending ultrasound results.  - Will image further pending above results.  - follow up with PCP in 2 weeks  for ongoing workup pending results above.

## 2016-01-27 NOTE — Patient Instructions (Signed)
Thanks for coming in today.   We will get blood work today.   We will get an ultrasound of the area.   We will also get a chest x-ray.   Once the lab results return if there are any abnormalities, we will proceed with further imaging.   Thanks for letting us take care of you.   Sincerely, Devota Pacealeb Sampson Self, MD Family Medicine - PGY 2

## 2016-01-28 LAB — CBC WITH DIFFERENTIAL/PLATELET
Basophils Absolute: 0 10*3/uL (ref 0.0–0.1)
Basophils Relative: 0 % (ref 0–1)
Eosinophils Absolute: 0 10*3/uL (ref 0.0–0.7)
Eosinophils Relative: 0 % (ref 0–5)
HCT: 44.3 % (ref 39.0–52.0)
Hemoglobin: 14.8 g/dL (ref 13.0–17.0)
Lymphocytes Relative: 20 % (ref 12–46)
Lymphs Abs: 1.4 10*3/uL (ref 0.7–4.0)
MCH: 32.1 pg (ref 26.0–34.0)
MCHC: 33.4 g/dL (ref 30.0–36.0)
MCV: 96.1 fL (ref 78.0–100.0)
MPV: 10.4 fL (ref 8.6–12.4)
Monocytes Absolute: 0.4 10*3/uL (ref 0.1–1.0)
Monocytes Relative: 6 % (ref 3–12)
Neutro Abs: 5 10*3/uL (ref 1.7–7.7)
Neutrophils Relative %: 74 % (ref 43–77)
Platelets: 324 10*3/uL (ref 150–400)
RBC: 4.61 MIL/uL (ref 4.22–5.81)
RDW: 18.1 % — ABNORMAL HIGH (ref 11.5–15.5)
WBC: 6.8 10*3/uL (ref 4.0–10.5)

## 2016-01-28 LAB — COMPLETE METABOLIC PANEL WITH GFR
ALT: 55 U/L — ABNORMAL HIGH (ref 9–46)
AST: 50 U/L — ABNORMAL HIGH (ref 10–35)
Albumin: 4.6 g/dL (ref 3.6–5.1)
Alkaline Phosphatase: 68 U/L (ref 40–115)
BUN: 19 mg/dL (ref 7–25)
CO2: 23 mmol/L (ref 20–31)
Calcium: 9.9 mg/dL (ref 8.6–10.3)
Chloride: 104 mmol/L (ref 98–110)
Creat: 0.89 mg/dL (ref 0.70–1.33)
GFR, Est African American: >89 mL/min (ref >=60)
GFR, Est Non African American: >89 mL/min (ref >=60)
Glucose, Bld: 104 mg/dL — ABNORMAL HIGH (ref 65–99)
Potassium: 4.9 mmol/L (ref 3.5–5.3)
Sodium: 142 mmol/L (ref 135–146)
Total Bilirubin: 0.7 mg/dL (ref 0.2–1.2)
Total Protein: 7.2 g/dL (ref 6.1–8.1)

## 2016-01-28 LAB — SEDIMENTATION RATE: Sed Rate: 5 mm/hr (ref 0–20)

## 2016-01-29 ENCOUNTER — Telehealth: Payer: Self-pay | Admitting: Family Medicine

## 2016-01-29 ENCOUNTER — Ambulatory Visit (INDEPENDENT_AMBULATORY_CARE_PROVIDER_SITE_OTHER): Payer: Medicaid Other | Admitting: Family Medicine

## 2016-01-29 ENCOUNTER — Encounter: Payer: Self-pay | Admitting: Family Medicine

## 2016-01-29 ENCOUNTER — Ambulatory Visit (HOSPITAL_COMMUNITY)
Admission: RE | Admit: 2016-01-29 | Discharge: 2016-01-29 | Disposition: A | Discharge status: Home / Self Care | Payer: Medicaid Other | Source: Ambulatory Visit | Attending: Family Medicine | Admitting: Family Medicine

## 2016-01-29 VITALS — BP 130/87 | HR 96 | Temp 97.5°F | Wt 118.0 lb

## 2016-01-29 DIAGNOSIS — I82629 Acute embolism and thrombosis of deep veins of unspecified upper extremity: Secondary | ICD-10-CM | POA: Insufficient documentation

## 2016-01-29 DIAGNOSIS — R590 Localized enlarged lymph nodes: Secondary | ICD-10-CM

## 2016-01-29 DIAGNOSIS — M25561 Pain in right knee: Secondary | ICD-10-CM

## 2016-01-29 DIAGNOSIS — R0602 Shortness of breath: Secondary | ICD-10-CM | POA: Diagnosis present

## 2016-01-29 DIAGNOSIS — M79605 Pain in left leg: Secondary | ICD-10-CM

## 2016-01-29 DIAGNOSIS — R918 Other nonspecific abnormal finding of lung field: Secondary | ICD-10-CM | POA: Diagnosis not present

## 2016-01-29 DIAGNOSIS — I82622 Acute embolism and thrombosis of deep veins of left upper extremity: Secondary | ICD-10-CM

## 2016-01-29 DIAGNOSIS — R599 Enlarged lymph nodes, unspecified: Secondary | ICD-10-CM | POA: Insufficient documentation

## 2016-01-29 MED ORDER — RIVAROXABAN (XARELTO) VTE STARTER PACK (15 & 20 MG): ORAL_TABLET | ORAL | Status: DC

## 2016-01-29 NOTE — Progress Notes (Signed)
*  Preliminary Results* Left upper extremity venous duplex completed. Left upper extremity is positive for acute occlusive deep vein thrombosis involving a single left brachial vein.  01/29/2016 11:01 AM  Gertie FeyMichelle Zoye Chandra, RVT, RDCS, RDMS

## 2016-01-29 NOTE — Progress Notes (Signed)
Patient ID: Malik KocherRobert [name], male   DOB: [birth year], 50 y.o.   MRN: [id number]   [facility] Yolande Jollyaleb G Awanda Wilcock, MD Phone: [phone]  Subjective:   # UE DVT - pt. Returns for early follow up after finding DVT with L Axillary ultrasound today.  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  - He is wheel chair bound.  - Has not had much swelling of his left arm, just fullness in his Axilla.  - No shortness of breath or chest pain.  - He does have RLE leg pain, no erythema or swelling of his RLE, but he does not have any erythema or swelling of LUE either.  - No fevers, no chills.  - Recent blood work without any acute abnormalities other than mildly elevated transaminases.   All relevant systems were reviewed and were negative unless otherwise noted in the HPI  Past Medical History Reviewed problem list.  Medications- reviewed and updated Current Outpatient Prescriptions  Medication Sig Dispense Refill  . atorvastatin (LIPITOR) 40 MG tablet Take 40 mg by mouth daily.    . fenofibrate (TRICOR) 145 MG tablet Take 145 mg by mouth daily.    . hydrochlorothiazide (MICROZIDE) 12.5 MG capsule TAKE (1) CAPSULE BY MOUTH ONCE DAILY. Needs PCP follow up. 30 capsule 1  . ibuprofen (ADVIL,MOTRIN) 200 MG tablet Take 2 tablets (400 mg total) by mouth every 6 (six) hours as needed. 60 tablet 2  . metoCLOPramide (REGLAN) 10 MG tablet TAKE 1 TABLET BY MOUTH EVERY 8 HOURS AS NEEDED FOR HICCUPS/NAUSEA. 30 tablet 0  . mirtazapine (REMERON) 15 MG tablet TAKE (1) TABLET BY MOUTH AT BEDTIME. Needs PCP follow up. 30 tablet 0  . Nutritional Supplements (NUTRITIONAL DRINK PO) Take 1 each by mouth 2 (two) times daily as needed (for nutrition). Mighty Shake    . oxyCODONE-acetaminophen (PERCOCET) 5-325 MG per tablet Take 1 tablet by mouth every 4 (four) hours as needed for severe pain. 20 tablet 0  . pantoprazole (PROTONIX) 40 MG tablet Take 1 tablet (40 mg total) by mouth daily. 30  tablet 2  . Rivaroxaban (XARELTO STARTER PACK) 15 & 20 MG TBPK Take as directed on package: Start with one 15mg  tablet by mouth twice a day with food. On Day 22, switch to one 20mg  tablet once a day with food. 51 each 0   No current facility-administered medications for this visit.   Chief complaint-noted No additions to family history Social history- patient is a current smoker  Objective: BP 130/87 mmHg  Pulse 96  Temp(Src) 97.5 F (36.4 C) (Oral)  Wt 118 lb (53.524 kg) Gen: NAD, alert, cooperative with exam, Cachectic.  HEENT: NCAT, EOMI, PERRL Neck: FROM, supple CV: RRR, good S1/S2, no murmur Resp: CTABL, no wheezes, non-labored Abd: SNTND, BS present, no guarding or organomegaly Ext: No edema, warm, Poor tone, Low muscle mass, moves UE/LE spontaneously LUE: no swelling, erythema, or tenderness. Axilla without fullness. Improved LAD there, may actually be venous clot. He is very thin.  RUE: No erythema, fullness or swelling.  LE: no swelling, edema, or erythema bilaterally.  Neuro: Alert and oriented, No gross deficits Skin: no rashes no lesions  Assessment/Plan:  DVT of upper extremity (deep vein thrombosis) (HCC) Pt. With Acute DVT of LUE. No hallmark signs including swelling or erythema. No / little pain. Provoked due to immobility.  - Start on Xarelto DVT treatement.  - Provoked, so will tx for 3 months.  - Discussed side  effects, risks of bleeding, and patient elected for treatment.  - Will begin today.  - With leg pain, will get U/S of LE. This will not change management, but will be helpful in evaluating clot burden.  - F/U in 2 weeks with me as previously scheduled.

## 2016-01-29 NOTE — Addendum Note (Signed)
Addended by: Yolande JollyMELANCON, Tanner Vigna G on: 01/29/2016 12:07 PM   Modules accepted: Orders

## 2016-01-29 NOTE — Assessment & Plan Note (Signed)
Pt. With Acute DVT of LUE. No hallmark signs including swelling or erythema. No / little pain. Provoked due to immobility.  - Start on Xarelto DVT treatement.  - Provoked, so will tx for 3 months.  - Discussed side effects, risks of bleeding, and patient elected for treatment.  - Will begin today.  - With leg pain, will get U/S of LE. This will not change management, but will be helpful in evaluating clot burden.  - F/U in 2 weeks with me as previously scheduled.

## 2016-01-29 NOTE — Patient Instructions (Signed)
Thanks for coming in today.   We need to start you on Xarelto. This is a blood thinner to treat the DVT in your left arm.   You will take the medication 15 mg twice daily with food for 21 days.   After 21 days, you will take one pill 20mg  once daily thereafter.   You will be treated with this for 3 months.   We will check your legs to see if you have a DVT there.   Come back for follow up at your appointment on 4/13.   Thanks for letting us take care of you.   Sincerely, Devota Pacealeb Ky Moskowitz, MD Family Medicine -PGY 2

## 2016-01-29 NOTE — Telephone Encounter (Signed)
Called with lab results. Left VM to call back with questions, otherwise we can talk at his next appointment with me.   CGM MD

## 2016-01-30 ENCOUNTER — Telehealth: Payer: Self-pay | Admitting: *Deleted

## 2016-01-30 NOTE — Telephone Encounter (Signed)
Received [facility] requesting a refill for Xarelto 20 mg 1 PO BID with meals, #60.  Only the start pack is listed on medication list.  Clovis PuMartin, Marga Gramajo L, RN

## 2016-02-03 MED ORDER — RIVAROXABAN 20 MG PO TABS: 20.0000 mg | ORAL_TABLET | Freq: Every day | ORAL | Status: DC

## 2016-02-03 NOTE — Telephone Encounter (Signed)
Never saw this fax in my box but I have sent in and approve of the Xarelto 20 mg. Dosage should be daily not BID. Rx I sent in is correct.

## 2016-02-12 ENCOUNTER — Encounter: Payer: Self-pay | Admitting: Family Medicine

## 2016-02-12 ENCOUNTER — Ambulatory Visit (INDEPENDENT_AMBULATORY_CARE_PROVIDER_SITE_OTHER): Payer: Medicaid Other | Admitting: Family Medicine

## 2016-02-12 VITALS — BP 100/60 | HR 80 | Temp 98.1°F

## 2016-02-12 DIAGNOSIS — R7401 Elevation of levels of liver transaminase levels: Secondary | ICD-10-CM

## 2016-02-12 DIAGNOSIS — R740 Nonspecific elevation of levels of transaminase and lactic acid dehydrogenase [LDH]: Secondary | ICD-10-CM

## 2016-02-12 DIAGNOSIS — M79604 Pain in right leg: Secondary | ICD-10-CM

## 2016-02-12 DIAGNOSIS — I82622 Acute embolism and thrombosis of deep veins of left upper extremity: Secondary | ICD-10-CM

## 2016-02-12 LAB — COMPLETE METABOLIC PANEL WITH GFR
ALT: 55 U/L — ABNORMAL HIGH (ref 9–46)
AST: 54 U/L — ABNORMAL HIGH (ref 10–35)
Albumin: 4.4 g/dL (ref 3.6–5.1)
Alkaline Phosphatase: 71 U/L (ref 40–115)
BUN: 17 mg/dL (ref 7–25)
CO2: 28 mmol/L (ref 20–31)
Calcium: 10 mg/dL (ref 8.6–10.3)
Chloride: 105 mmol/L (ref 98–110)
Creat: 0.87 mg/dL (ref 0.70–1.33)
GFR, Est African American: >89 mL/min (ref >=60)
GFR, Est Non African American: >89 mL/min (ref >=60)
Glucose, Bld: 102 mg/dL — ABNORMAL HIGH (ref 65–99)
Potassium: 5.3 mmol/L (ref 3.5–5.3)
Sodium: 144 mmol/L (ref 135–146)
Total Bilirubin: 0.6 mg/dL (ref 0.2–1.2)
Total Protein: 6.8 g/dL (ref 6.1–8.1)

## 2016-02-12 MED ORDER — TRAMADOL-ACETAMINOPHEN 37.5-325 MG PO TABS: 1.0000 | ORAL_TABLET | Freq: Three times a day (TID) | ORAL | Status: DC | PRN

## 2016-02-12 NOTE — Progress Notes (Signed)
Patient ID: Malik [name], male   DOB: January 07, 1966, 50 y.o.   MRN: [id number]   [facility] Yolande Jolly, MD Phone: [phone]  Subjective:   # Follow Up UE DVT - Doesn't feel any more fullness or tightness in his left axilla.  - No swelling in his left arm.  - No bruising.  - No issues with the Xarelto.  - No bleeding rectally or nose bleeds. No bruising.  - Has been taking ibuprofen.  - Is taking protonix.  - No GI upset.  - No falls. Almost had a fall this am.   # Right Lower Leg Pain - No swelling - No redness - Pain has been ongoing for about a month.  - started about the same time as the sensation under his left arm that turned out to be a DVT.  - No history of trauma to the area. - No bruising.  - Taking ibuprofen, takes the pain completely away but returns in 6-7 hours.  - pain is about a 5.  - dull ache / throb.   All relevant systems were reviewed and were negative unless otherwise noted in the HPI  Past Medical History Reviewed problem list.  Medications- reviewed and updated Current Outpatient Prescriptions  Medication Sig Dispense Refill  . atorvastatin (LIPITOR) 40 MG tablet Take 40 mg by mouth daily.    . fenofibrate (TRICOR) 145 MG tablet Take 145 mg by mouth daily.    . hydrochlorothiazide (MICROZIDE) 12.5 MG capsule TAKE (1) CAPSULE BY MOUTH ONCE DAILY. Needs PCP follow up. 30 capsule 1  . ibuprofen (ADVIL,MOTRIN) 200 MG tablet Take 2 tablets (400 mg total) by mouth every 6 (six) hours as needed. 60 tablet 2  . metoCLOPramide (REGLAN) 10 MG tablet TAKE 1 TABLET BY MOUTH EVERY 8 HOURS AS NEEDED FOR HICCUPS/NAUSEA. 30 tablet 0  . mirtazapine (REMERON) 15 MG tablet TAKE (1) TABLET BY MOUTH AT BEDTIME. Needs PCP follow up. 30 tablet 0  . Nutritional Supplements (NUTRITIONAL DRINK PO) Take 1 each by mouth 2 (two) times daily as needed (for nutrition). Mighty Shake    . oxyCODONE-acetaminophen (PERCOCET) 5-325 MG per tablet Take 1  tablet by mouth every 4 (four) hours as needed for severe pain. 20 tablet 0  . pantoprazole (PROTONIX) 40 MG tablet Take 1 tablet (40 mg total) by mouth daily. 30 tablet 2  . rivaroxaban (XARELTO) 20 MG TABS tablet Take 1 tablet (20 mg total) by mouth daily with supper. 30 tablet 1   No current facility-administered medications for this visit.   Chief complaint-noted No additions to family history Social history- patient is a current 4-5 cigarettes/ day smoker  Objective: BP 100/60 mmHg  Pulse 80  Temp(Src) 98.1 F (36.7 C) (Oral)  Wt   SpO2 96% Gen: NAD, alert, cooperative with exam HEENT: NCAT, EOMI, PERRL, TMs nml Neck: FROM, supple CV: RRR, good S1/S2, no murmur,  Resp: CTABL, no wheezes, non-labored Abd: SNTND, BS present, no guarding or organomegaly Ext: No edema, warm, normal tone, moves UE/LE spontaneously Neuro: Alert and oriented, No gross deficits Skin: no rashes no lesions  Assessment/Plan:  DVT of upper extremity (deep vein thrombosis) (HCC) Here for follow up. Sensation of "fullness" is now gone. No issues with xarelto. No nosebleds or rectal bleeding. Has been on IBuprofen. Instructed to stop today.  - Stop Ibuprofen.  - Continue xarelto  BID until 21 days.  - Total therapy 3 months.    # Right Leg Pain -  could be an additional DVT as his UE DVT did not present with significant swelling. Pain is anterior, however. Right leg is much cooler on palpation than left, with some redness. Hair loss noted, though unclear how much he had at baseline. Considering arterial insufficiency given smoking vs. Bone pain related to MD. Ibuprofen was helping but need to stop due to Xarelto.  - US of RLE to see if this is DVT related. Would not change mgmt but would give us etiology.  - Consider ABI if US is negative.  - Tramadol with tylenol for pain instead of ibuprofen.  - f/u in 2-3 weeks. If completely negative U/S and ABI.

## 2016-02-12 NOTE — Assessment & Plan Note (Signed)
Here for follow up. Sensation of "fullness" is now gone. No issues with xarelto. No nosebleds or rectal bleeding. Has been on IBuprofen. Instructed to stop today.  - Stop Ibuprofen.  - Continue xarelto 15mg  BID until 21 days.  - Total therapy 3 months.

## 2016-02-12 NOTE — Patient Instructions (Signed)
Thanks for coming in today.   We will set up an ultrasound for you to have done of your right let.   If it is normal, I will place an order for Ankle Brachial Index to be done. We will call with this appointment if we order this.   You will start taking Ultracet 1 pill every 8 hours as needed for pain.   Stop the ibuprofen.   We will repeat the liver function test today and call you with the results.   Thanks for letting us take care of you.   Sincerely, Devota Pacealeb Alexzander Dolinger, MD Family Medicine -PGY 2

## 2016-02-13 ENCOUNTER — Other Ambulatory Visit: Payer: Self-pay | Admitting: Family Medicine

## 2016-02-13 DIAGNOSIS — M79604 Pain in right leg: Secondary | ICD-10-CM

## 2016-02-17 ENCOUNTER — Telehealth: Payer: Self-pay | Admitting: Family Medicine

## 2016-02-17 DIAGNOSIS — R740 Nonspecific elevation of levels of transaminase and lactic acid dehydrogenase [LDH]: Principal | ICD-10-CM

## 2016-02-17 DIAGNOSIS — R7401 Elevation of levels of liver transaminase levels: Secondary | ICD-10-CM

## 2016-02-17 NOTE — Telephone Encounter (Signed)
Called to discuss with [name]. He has very mild transaminitis. May be NAFLD. Needs RUQ US. He is getting an US RLE later this week. Will try to coordinate.   CGM MD

## 2016-02-18 ENCOUNTER — Encounter (HOSPITAL_COMMUNITY): Payer: Medicaid Other

## 2016-02-18 NOTE — Telephone Encounter (Signed)
LM for patient and aunt to call back regarding tomorrow's appt for right upper quadrant ultrasound.  He is scheduled for 12:45pm and is unable to eat or drink 6 hours prior to imaging. Jazmin Hartsell,CMA

## 2016-02-18 NOTE — Telephone Encounter (Signed)
Patient has been scheduled for RUOQ at 1pm and his LE doppler at 3:30pm.  [facility] to confirm these dates and times.  She is aware that patient will be unable to eat before imaging and will let him know also.  Jazmin Hartsell,CMA

## 2016-02-19 ENCOUNTER — Ambulatory Visit (HOSPITAL_COMMUNITY)
Admission: RE | Admit: 2016-02-19 | Discharge: 2016-02-19 | Disposition: A | Discharge status: Home / Self Care | Payer: Medicaid Other | Source: Ambulatory Visit | Attending: Family Medicine | Admitting: Family Medicine

## 2016-02-19 ENCOUNTER — Ambulatory Visit (HOSPITAL_COMMUNITY): Admission: RE | Admit: 2016-02-19 | Payer: Medicaid Other | Source: Ambulatory Visit

## 2016-02-19 DIAGNOSIS — K760 Fatty (change of) liver, not elsewhere classified: Secondary | ICD-10-CM | POA: Insufficient documentation

## 2016-02-19 DIAGNOSIS — R7401 Elevation of levels of liver transaminase levels: Secondary | ICD-10-CM

## 2016-02-19 DIAGNOSIS — R740 Nonspecific elevation of levels of transaminase and lactic acid dehydrogenase [LDH]: Secondary | ICD-10-CM | POA: Diagnosis not present

## 2016-02-19 DIAGNOSIS — M79604 Pain in right leg: Secondary | ICD-10-CM

## 2016-02-19 NOTE — Progress Notes (Signed)
VASCULAR LAB PRELIMINARY  PRELIMINARY  PRELIMINARY  PRELIMINARY  Right lower extremity venous duplex completed.    Preliminary report:  Right:  No evidence of DVT, superficial thrombosis, or Baker's cyst. Called Dr. Deirdre Priesthambliss @2 :30 pm  Malik [name], RVT, RDMS 02/19/2016, 2:35 PM   .

## 2016-02-20 ENCOUNTER — Other Ambulatory Visit: Payer: Self-pay | Admitting: Family Medicine

## 2016-02-20 ENCOUNTER — Ambulatory Visit (HOSPITAL_COMMUNITY)
Admission: RE | Admit: 2016-02-20 | Discharge: 2016-02-20 | Disposition: A | Discharge status: Home / Self Care | Payer: Medicaid Other | Source: Ambulatory Visit | Attending: Family Medicine | Admitting: Family Medicine

## 2016-02-20 DIAGNOSIS — M7989 Other specified soft tissue disorders: Secondary | ICD-10-CM | POA: Diagnosis not present

## 2016-02-20 DIAGNOSIS — M79601 Pain in right arm: Secondary | ICD-10-CM | POA: Diagnosis not present

## 2016-02-20 DIAGNOSIS — M79604 Pain in right leg: Secondary | ICD-10-CM | POA: Insufficient documentation

## 2016-03-09 ENCOUNTER — Telehealth: Payer: Self-pay | Admitting: Family Medicine

## 2016-03-09 DIAGNOSIS — K838 Other specified diseases of biliary tract: Secondary | ICD-10-CM

## 2016-03-09 NOTE — Telephone Encounter (Signed)
Called [name] with the results of his recent RUQ ultrasound. He had some slight intrahepatic bile duct dilation, and will need follow up Hepatic Phase MRI to further evaluate. Could also consider workup for persistently elevated LFT's. Will forward to PCP. MRI ordered.  CGM MD

## 2016-03-18 ENCOUNTER — Ambulatory Visit
Admission: RE | Admit: 2016-03-18 | Discharge: 2016-03-18 | Disposition: A | Discharge status: Home / Self Care | Payer: Medicaid Other | Source: Ambulatory Visit | Attending: Family Medicine | Admitting: Family Medicine

## 2016-03-18 DIAGNOSIS — K838 Other specified diseases of biliary tract: Secondary | ICD-10-CM

## 2016-03-18 MED ORDER — GADOBENATE DIMEGLUMINE 529 MG/ML IV SOLN
14.0000 mL | Freq: Once | INTRAVENOUS | Status: AC | PRN
  Administered 2016-03-18: 14 mL via INTRAVENOUS

## 2016-03-25 ENCOUNTER — Other Ambulatory Visit: Payer: Self-pay | Admitting: *Deleted

## 2016-03-25 DIAGNOSIS — M79604 Pain in right leg: Secondary | ICD-10-CM

## 2016-03-25 MED ORDER — TRAMADOL-ACETAMINOPHEN 37.5-325 MG PO TABS: 1.0000 | ORAL_TABLET | Freq: Three times a day (TID) | ORAL | Status: DC | PRN

## 2016-03-25 NOTE — Telephone Encounter (Signed)
I approve this medication refill. Can you please send it in I won't be in the office anymore this week to do it. Please inform patient that for any more refills he will need to be seen.

## 2016-03-30 ENCOUNTER — Telehealth: Payer: Self-pay | Admitting: Family Medicine

## 2016-03-30 NOTE — Telephone Encounter (Signed)
Called with liver MRI results. Seems the ultrasound was a false positive. Left VM and patient to return calls with any questions. No abnormalities.   CGM MD

## 2016-05-10 ENCOUNTER — Ambulatory Visit (HOSPITAL_COMMUNITY)
Admission: EM | Admit: 2016-05-10 | Discharge: 2016-05-10 | Disposition: A | Discharge status: Skilled Nursing Facility | Payer: Medicaid Other | Attending: Emergency Medicine | Admitting: Emergency Medicine

## 2016-05-10 ENCOUNTER — Emergency Department (HOSPITAL_COMMUNITY): Payer: Medicaid Other | Admitting: Certified Registered Nurse Anesthetist

## 2016-05-10 ENCOUNTER — Encounter (HOSPITAL_COMMUNITY)
Admission: EM | Disposition: A | Discharge status: Skilled Nursing Facility | Payer: Self-pay | Source: Home / Self Care | Attending: Emergency Medicine

## 2016-05-10 ENCOUNTER — Emergency Department (HOSPITAL_COMMUNITY): Payer: Medicaid Other

## 2016-05-10 ENCOUNTER — Encounter (HOSPITAL_COMMUNITY): Payer: Self-pay | Admitting: Emergency Medicine

## 2016-05-10 DIAGNOSIS — G7111 Myotonic muscular dystrophy: Secondary | ICD-10-CM | POA: Diagnosis not present

## 2016-05-10 DIAGNOSIS — I10 Essential (primary) hypertension: Secondary | ICD-10-CM | POA: Diagnosis not present

## 2016-05-10 DIAGNOSIS — K219 Gastro-esophageal reflux disease without esophagitis: Secondary | ICD-10-CM | POA: Insufficient documentation

## 2016-05-10 DIAGNOSIS — Z86718 Personal history of other venous thrombosis and embolism: Secondary | ICD-10-CM | POA: Diagnosis not present

## 2016-05-10 DIAGNOSIS — F17210 Nicotine dependence, cigarettes, uncomplicated: Secondary | ICD-10-CM | POA: Diagnosis not present

## 2016-05-10 DIAGNOSIS — R197 Diarrhea, unspecified: Secondary | ICD-10-CM | POA: Diagnosis not present

## 2016-05-10 DIAGNOSIS — G3184 Mild cognitive impairment, so stated: Secondary | ICD-10-CM | POA: Diagnosis not present

## 2016-05-10 DIAGNOSIS — T18128A Food in esophagus causing other injury, initial encounter: Secondary | ICD-10-CM | POA: Insufficient documentation

## 2016-05-10 DIAGNOSIS — Z79899 Other long term (current) drug therapy: Secondary | ICD-10-CM | POA: Diagnosis not present

## 2016-05-10 DIAGNOSIS — Z7901 Long term (current) use of anticoagulants: Secondary | ICD-10-CM | POA: Diagnosis not present

## 2016-05-10 DIAGNOSIS — X58XXXA Exposure to other specified factors, initial encounter: Secondary | ICD-10-CM | POA: Insufficient documentation

## 2016-05-10 DIAGNOSIS — K222 Esophageal obstruction: Secondary | ICD-10-CM

## 2016-05-10 DIAGNOSIS — Z9049 Acquired absence of other specified parts of digestive tract: Secondary | ICD-10-CM | POA: Diagnosis not present

## 2016-05-10 HISTORY — PX: ESOPHAGOGASTRODUODENOSCOPY: SHX5428

## 2016-05-10 LAB — URINE MICROSCOPIC-ADD ON: RBC / HPF: NONE SEEN RBC/hpf (ref 0–5)

## 2016-05-10 LAB — COMPREHENSIVE METABOLIC PANEL
ALT: 79 U/L — ABNORMAL HIGH (ref 17–63)
AST: 67 U/L — ABNORMAL HIGH (ref 15–41)
Albumin: 4.5 g/dL (ref 3.5–5.0)
Alkaline Phosphatase: 71 U/L (ref 38–126)
Anion gap: 9 (ref 5–15)
BUN: 20 mg/dL (ref 6–20)
CO2: 28 mmol/L (ref 22–32)
Calcium: 10 mg/dL (ref 8.9–10.3)
Chloride: 105 mmol/L (ref 101–111)
Creatinine, Ser: 0.73 mg/dL (ref 0.61–1.24)
GFR calc Af Amer: >60 mL/min (ref >60)
GFR calc non Af Amer: >60 mL/min (ref >60)
Glucose, Bld: 100 mg/dL — ABNORMAL HIGH (ref 65–99)
Potassium: 3.8 mmol/L (ref 3.5–5.1)
Sodium: 142 mmol/L (ref 135–145)
Total Bilirubin: 1.2 mg/dL (ref 0.3–1.2)
Total Protein: 7.2 g/dL (ref 6.5–8.1)

## 2016-05-10 LAB — CBC WITH DIFFERENTIAL/PLATELET
Basophils Absolute: 0 10*3/uL (ref 0.0–0.1)
Basophils Relative: 1 %
Eosinophils Absolute: 0 10*3/uL (ref 0.0–0.7)
Eosinophils Relative: 1 %
HCT: 46.7 % (ref 39.0–52.0)
Hemoglobin: 16.3 g/dL (ref 13.0–17.0)
Lymphocytes Relative: 32 %
Lymphs Abs: 1.9 10*3/uL (ref 0.7–4.0)
MCH: 32.9 pg (ref 26.0–34.0)
MCHC: 34.9 g/dL (ref 30.0–36.0)
MCV: 94.3 fL (ref 78.0–100.0)
Monocytes Absolute: 0.3 10*3/uL (ref 0.1–1.0)
Monocytes Relative: 6 %
Neutro Abs: 3.6 10*3/uL (ref 1.7–7.7)
Neutrophils Relative %: 60 %
Platelets: 229 10*3/uL (ref 150–400)
RBC: 4.95 MIL/uL (ref 4.22–5.81)
RDW: 15.2 % (ref 11.5–15.5)
WBC: 5.8 10*3/uL (ref 4.0–10.5)

## 2016-05-10 LAB — URINALYSIS, ROUTINE W REFLEX MICROSCOPIC
Glucose, UA: NEGATIVE mg/dL
Hgb urine dipstick: NEGATIVE
Ketones, ur: NEGATIVE mg/dL
Leukocytes, UA: NEGATIVE
Nitrite: NEGATIVE
Protein, ur: NEGATIVE mg/dL
Specific Gravity, Urine: 1.03 (ref 1.005–1.030)
pH: 5 (ref 5.0–8.0)

## 2016-05-10 LAB — I-STAT CG4 LACTIC ACID, ED
Lactic Acid, Venous: 1.3 mmol/L (ref 0.5–1.9)
Lactic Acid, Venous: 0.86 mmol/L (ref 0.5–1.9)

## 2016-05-10 LAB — I-STAT TROPONIN, ED: Troponin i, poc: 0.01 ng/mL (ref 0.00–0.08)

## 2016-05-10 LAB — LIPASE, BLOOD: Lipase: 43 U/L (ref 11–51)

## 2016-05-10 SURGERY — EGD (ESOPHAGOGASTRODUODENOSCOPY)
Anesthesia: General

## 2016-05-10 MED ORDER — LIDOCAINE HCL (CARDIAC) 20 MG/ML IV SOLN
INTRAVENOUS | Status: DC | PRN
  Administered 2016-05-10: 100 mg via INTRAVENOUS

## 2016-05-10 MED ORDER — PROPOFOL 10 MG/ML IV BOLUS
INTRAVENOUS | Status: AC
  Filled 2016-05-10: qty 20

## 2016-05-10 MED ORDER — LIDOCAINE HCL (CARDIAC) 20 MG/ML IV SOLN
INTRAVENOUS | Status: AC
  Filled 2016-05-10: qty 5

## 2016-05-10 MED ORDER — FENTANYL CITRATE (PF) 100 MCG/2ML IJ SOLN
INTRAMUSCULAR | Status: DC | PRN
  Administered 2016-05-10: 100 ug via INTRAVENOUS

## 2016-05-10 MED ORDER — ROCURONIUM BROMIDE 100 MG/10ML IV SOLN
INTRAVENOUS | Status: AC
  Filled 2016-05-10: qty 1

## 2016-05-10 MED ORDER — ROCURONIUM BROMIDE 100 MG/10ML IV SOLN
INTRAVENOUS | Status: DC | PRN
  Administered 2016-05-10: 30 mg via INTRAVENOUS

## 2016-05-10 MED ORDER — FENTANYL CITRATE (PF) 100 MCG/2ML IJ SOLN
INTRAMUSCULAR | Status: AC
  Filled 2016-05-10: qty 2

## 2016-05-10 MED ORDER — ONDANSETRON HCL 4 MG/2ML IJ SOLN
INTRAMUSCULAR | Status: DC | PRN
  Administered 2016-05-10: 4 mg via INTRAVENOUS

## 2016-05-10 MED ORDER — GLUCAGON HCL RDNA (DIAGNOSTIC) 1 MG IJ SOLR
1.0000 mg | Freq: Once | INTRAMUSCULAR | Status: AC
  Administered 2016-05-10: 1 mg via INTRAVENOUS
  Filled 2016-05-10: qty 1

## 2016-05-10 MED ORDER — SUGAMMADEX SODIUM 200 MG/2ML IV SOLN
INTRAVENOUS | Status: DC | PRN
  Administered 2016-05-10: 200 mg via INTRAVENOUS

## 2016-05-10 MED ORDER — PROPOFOL 10 MG/ML IV BOLUS
INTRAVENOUS | Status: DC | PRN
  Administered 2016-05-10: 100 mg via INTRAVENOUS

## 2016-05-10 MED ORDER — ONDANSETRON HCL 4 MG/2ML IJ SOLN
INTRAMUSCULAR | Status: AC
  Filled 2016-05-10: qty 2

## 2016-05-10 MED ORDER — LACTATED RINGERS IV SOLN
INTRAVENOUS | Status: DC | PRN
  Administered 2016-05-10: 16:00:00 via INTRAVENOUS

## 2016-05-10 MED ORDER — SODIUM CHLORIDE 0.9 % IV SOLN
Freq: Once | INTRAVENOUS | Status: AC
  Administered 2016-05-10: 150 mL/h via INTRAVENOUS

## 2016-05-10 MED ORDER — SODIUM CHLORIDE 0.9 % IV BOLUS (SEPSIS)
500.0000 mL | Freq: Once | INTRAVENOUS | Status: AC
  Administered 2016-05-10: 500 mL via INTRAVENOUS

## 2016-05-10 MED ORDER — PROPOFOL 500 MG/50ML IV EMUL
INTRAVENOUS | Status: DC | PRN
  Administered 2016-05-10: 150 ug/kg/min via INTRAVENOUS

## 2016-05-10 MED ORDER — SUGAMMADEX SODIUM 200 MG/2ML IV SOLN
INTRAVENOUS | Status: AC
  Filled 2016-05-10: qty 2

## 2016-05-10 NOTE — Anesthesia Procedure Notes (Signed)
Procedure Name: Intubation Date/Time: 05/10/2016 4:12 PM Performed by: Epimenio SarinJARVELA, Colyn Miron R Pre-anesthesia Checklist: Patient identified, Emergency Drugs available, Suction available, Patient being monitored and Timeout performed Patient Re-evaluated:Patient Re-evaluated prior to inductionOxygen Delivery Method: Circle system utilized Preoxygenation: Pre-oxygenation with 100% oxygen Intubation Type: IV induction Ventilation: Mask ventilation without difficulty and Oral airway inserted - appropriate to patient size Laryngoscope Size: Mac and 3 Grade View: Grade II Tube type: Oral Tube size: 7.0 mm Number of attempts: 1 Airway Equipment and Method: Stylet Placement Confirmation: ETT inserted through vocal cords under direct vision,  positive ETCO2 and breath sounds checked- equal and bilateral Secured at: 22 cm Tube secured with: Tape Dental Injury: Teeth and Oropharynx as per pre-operative assessment

## 2016-05-10 NOTE — ED Notes (Signed)
[facility]

## 2016-05-10 NOTE — ED Notes (Signed)
Per EMS [facility]  Patient states that symptoms started after eating burger from [employer] yesterday around 1pm.

## 2016-05-10 NOTE — ED Notes (Signed)
Bed: [id number] Expected date:  Expected time:  Means of arrival: Ambulance Comments: EMS n/v

## 2016-05-10 NOTE — ED Notes (Signed)
Patient back from [facility].  Family asking about getting ambulance to take patient back to facility. Informed them that once patient is discharged then I will notify PTAR of trasnport back to facility.

## 2016-05-10 NOTE — Anesthesia Preprocedure Evaluation (Addendum)
Anesthesia Evaluation  Patient identified by MRN, date of birth, ID band Patient awake    Reviewed: Allergy & Precautions, NPO status , Patient's Chart, lab work & pertinent test results  Airway Mallampati: III  TM Distance: >3 FB Neck ROM: Full    Dental  (+) Edentulous Upper, Edentulous Lower   Pulmonary Current Smoker,    Pulmonary exam normal breath sounds clear to auscultation       Cardiovascular hypertension, Pt. on medications Normal cardiovascular exam Rhythm:Regular Rate:Normal     Neuro/Psych  Neuromuscular disease negative psych ROS   GI/Hepatic negative GI ROS, Neg liver ROS,   Endo/Other  negative endocrine ROS  Renal/GU negative Renal ROS  negative genitourinary   Musculoskeletal Myotonic muscular dystrophy   Abdominal   Peds negative pediatric ROS (+)  Hematology negative hematology ROS (+)   Anesthesia Other Findings   Reproductive/Obstetrics negative OB ROS                           Anesthesia Physical Anesthesia Plan  ASA: III and emergent  Anesthesia Plan: General   Post-op Pain Management:    Induction: Intravenous  Airway Management Planned: Oral ETT  Additional Equipment:   Intra-op Plan:   Post-operative Plan: Extubation in OR  Informed Consent: I have reviewed the patients History and Physical, chart, labs and discussed the procedure including the risks, benefits and alternatives for the proposed anesthesia with the patient or authorized representative who has indicated his/her understanding and acceptance.   Dental advisory given  Plan Discussed with: CRNA and Surgeon  Anesthesia Plan Comments: (No succinylcholine)       Anesthesia Quick Evaluation

## 2016-05-10 NOTE — Op Note (Signed)
[facility] Patient Name: Malik KocherRobert [name] Procedure Date: 05/10/2016 MRN: [id number] Attending MD: Malik LakeHenry L. Myrtie [name] , MD Date of Birth: 10-19-1966 CSN: [id number] Age: 50 Admit Type: Outpatient Procedure:                Upper GI endoscopy Indications:              Foreign body in the esophagus Providers:                Malik CooterHenry L. Myrtie Neitheranis, MD, Malik AwkwardShannon Love, RN, Malik [name]                            Malik [name], Technician, Malik CeraJosh Jarvela, Malik [name] Referring MD:              Medicines:                General Anesthesia Complications:            No immediate complications. Estimated Blood Loss:     Estimated blood loss: none. Procedure:                Pre-Anesthesia Assessment:                           - Prior to the procedure, a History and Physical                            was performed, and patient medications and                            allergies were reviewed. The patient's tolerance of                            previous anesthesia was also reviewed. The risks                            and benefits of the procedure and the sedation                            options and risks were discussed with the patient.                            All questions were answered, and informed consent                            was obtained. Prior Anticoagulants: The patient has                            taken Xarelto (rivaroxaban), last dose was day of                            procedure. ASA Grade Assessment: III - A patient                            with severe systemic disease. After reviewing the  risks and benefits, the patient was deemed in                            satisfactory condition to undergo the procedure.                           After obtaining informed consent, the endoscope was                            passed under direct vision. Throughout the                            procedure, the patient's blood pressure, pulse, and       oxygen saturations were monitored continuously. The                            Endoscope was introduced through the mouth, and                            advanced to the second part of duodenum. The upper                            GI endoscopy was accomplished without difficulty.                            The patient tolerated the procedure well. Findings:      Food was found in the lower third of the esophagus. It was easily pushed       into the stomach without mucosal trauma. There was no stricture,       esophageal dilatation or resistance passing the scope through the EGJ.      The stomach was normal.      The cardia and gastric fundus were normal on retroflexion.      The examined duodenum was normal. Impression:               - Food in the lower third of the esophagus.                           - Normal stomach.                           - Normal examined duodenum.                           - No specimens collected. Moderate Sedation:      GETA Recommendation:           - Resume previous diet.                           - Continue present medications.                           - Discharge home from emergency department.                           Follow up  with GI only as needed                           - The patient would benefit from dentures.                           - Return to normal activities tomorrow. Procedure Code(s):        --- Professional ---                           [phone], Esophagogastroduodenoscopy, flexible,                            transoral; diagnostic, including collection of                            specimen(s) by brushing or washing, when performed                            (separate procedure) Diagnosis Code(s):        --- Professional ---                           X91.478G, Food in esophagus causing other injury,                            initial encounter                           T18.108A, Unspecified foreign body in esophagus                             causing other injury, initial encounter CPT copyright 2016 American Medical Association. All rights reserved. The codes documented in this report are preliminary and upon coder review may  be revised to meet current compliance requirements. Malik [name] L. [name], MD 05/10/2016 4:32:26 PM This report has been signed electronically. Number of Addenda: 0

## 2016-05-10 NOTE — ED Notes (Addendum)
Patient given Ginger ale and applesauce per patient request. Patient tried 2 sips of Ginger ale, patient unable to tolerate as came back up.  Patient and family instructed to not eat or drink anything else at this time.  Will make Dr Cyndie ChimeNguyen aware.

## 2016-05-10 NOTE — ED Notes (Signed)
Patient going to [facility]

## 2016-05-10 NOTE — Consult Note (Signed)
   Consultation  Referring Provider:  Dr. Nguyen    Primary Care Physician:  Jazma Phelps, DO Primary Gastroenterologist: Dr. Hung        Reason for Consultation:  Food impaction            HPI:   Malik [name] is a 49 y.o. Caucasian male with a past medical history positive for myotonic muscular dystrophy, hypertension, reflux and recent addition of Xarelto for anticoagulation for DVT prophylaxis within the past 3 months, who presented to the ER today with a complaint of "unable to keep anything down". At the time of my interview, it is apparent that the patient does have some mild cognitive impairment. He tells me that he ate a double cheeseburger yesterday around 1:00 and 30 minutes after this he started vomiting up his cheeseburger, which felt like it "never went down". He explains that he did try to drink something right after this and he vomited that as well. He tells me he also tried to eat something for dinner, which didn't stay down. Associated symptoms include 2 episodes of loose stool, at 5 AM and then again at 8:30.  At the time of my interview patient is asked to drink some ginger ale and after taking a long slurp it is noted that this only stays down for 1 minute before he regurgitates it into the emesis bag.  Past medical history is positive for recently being placed on Xarelto due to DVT 3 months ago.  Patient denies fever, chills, anorexia, previous dysphagia, reflux, nausea, vomiting, abdominal pain, melena, hematochezia, use of NSAIDs or previous episodes of the same.  Past Medical History  Diagnosis Date  . 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HCC)   . Hypertension   . Hiccups   . High blood pressure     Past Surgical History  Procedure Laterality Date  . Multiple tooth extractions  2007    Pt had all teeth pulled, has not yet gotten dentures.   . Cholecystectomy      Family History  Problem Relation Age of Onset  . COPD Mother   . Diabetes Mother     Social  History  Substance Use Topics  . Smoking status: Current Some Day Smoker -- 0.30 packs/day    Types: Cigarettes  . Smokeless tobacco: Never Used     Comment: Pt states that he has not bought any since mid september, but will smoke if he finds one  . Alcohol Use: No    Prior to Admission medications   Medication Sig Start Date End Date Taking? Authorizing Provider  atorvastatin (LIPITOR) 40 MG tablet Take 40 mg by mouth daily.   Yes Historical Provider, MD  Chlorpheniramine Maleate (ALLERGY RELIEF PO) Take 1 tablet by mouth 3 (three) times daily as needed (itching).   Yes Historical Provider, MD  fenofibrate (TRICOR) 145 MG tablet Take 145 mg by mouth daily.   Yes Historical Provider, MD  hydrochlorothiazide (MICROZIDE) 12.5 MG capsule TAKE (1) CAPSULE BY MOUTH ONCE DAILY. Needs PCP follow up. 03/06/15  Yes [name], MD  metoCLOPramide (REGLAN) 10 MG tablet TAKE 1 TABLET BY MOUTH EVERY 8 HOURS AS NEEDED FOR HICCUPS/NAUSEA. 09/06/14  Yes [name], MD  mirtazapine (REMERON) 15 MG tablet TAKE (1) TABLET BY MOUTH AT BEDTIME. Needs PCP follow up. 03/06/15  Yes [name], MD  pantoprazole (PROTONIX) 40 MG tablet Take 1 tablet (40 mg total) by mouth daily. 03/06/15  Yes [name], MD  rivaroxaban (  XARELTO) 20 MG TABS tablet Take 1 tablet (20 mg total) by mouth daily with supper. 02/03/16  Yes Jazma Y Phelps, DO  traMADol-acetaminophen (ULTRACET) 37.5-325 MG tablet Take 1 tablet by mouth every 8 (eight) hours as needed for moderate pain. 03/25/16  Yes Jazma Y Phelps, DO    No current facility-administered medications for this encounter.   Current Outpatient Prescriptions  Medication Sig Dispense Refill  . atorvastatin (LIPITOR) 40 MG tablet Take 40 mg by mouth daily.    . Chlorpheniramine Maleate (ALLERGY RELIEF PO) Take 1 tablet by mouth 3 (three) times daily as needed (itching).    . fenofibrate (TRICOR) 145 MG tablet Take 145 mg by mouth daily.    .  hydrochlorothiazide (MICROZIDE) 12.5 MG capsule TAKE (1) CAPSULE BY MOUTH ONCE DAILY. Needs PCP follow up. 30 capsule 1  . metoCLOPramide (REGLAN) 10 MG tablet TAKE 1 TABLET BY MOUTH EVERY 8 HOURS AS NEEDED FOR HICCUPS/NAUSEA. 30 tablet 0  . mirtazapine (REMERON) 15 MG tablet TAKE (1) TABLET BY MOUTH AT BEDTIME. Needs PCP follow up. 30 tablet 0  . pantoprazole (PROTONIX) 40 MG tablet Take 1 tablet (40 mg total) by mouth daily. 30 tablet 2  . rivaroxaban (XARELTO) 20 MG TABS tablet Take 1 tablet (20 mg total) by mouth daily with supper. 30 tablet 1  . traMADol-acetaminophen (ULTRACET) 37.5-325 MG tablet Take 1 tablet by mouth every 8 (eight) hours as needed for moderate pain. 90 tablet 0    Allergies as of 05/10/2016  . (No Known Allergies)     Review of Systems:    Constitutional: No weight loss, fever, chills, weakness or fatigue HEENT: Eyes: No Change in vision               Ears, Nose, Throat:  No change in hearing Skin: No new rash Cardiovascular: No chest pain or palpitations   Respiratory: No SOB or cough Gastrointestinal: See HPI and otherwise negative Genitourinary: No dysuria or change in urinary frequency Neurological: No headache or dizziness Musculoskeletal: No new muscle or back pain Hematologic: No anemia Psychiatric: No history of depression or anxiety   Physical Exam:  Vital signs in last 24 hours: Temp:  [97.7 F (36.5 C)] 97.7 F (36.5 C) (07/10 0919) Pulse Rate:  [66-71] 66 (07/10 1343) Resp:  [15-17] 15 (07/10 1343) BP: (109-135)/(81-83) 135/83 mmHg (07/10 1343) SpO2:  [94 %-96 %] 96 % (07/10 1343)   General:  Caucasian male appears to be in NAD, Well developed, Well nourished, alert and cooperative Head:  Normocephalic and atraumatic. Eyes:   PEERL, EOMI. No icterus. Conjunctiva pink. Ears:  Normal auditory acuity. Neck:  Supple Throat: Oral cavity and pharynx without inflammation, swelling or lesion. Poor dentition Lungs: Respirations even and  unlabored. Crackles present right anterior lung base  Heart: Normal S1, S2. No MRG. Regular rate and rhythm. No peripheral edema, cyanosis or pallor.  Abdomen:  Soft, mild TTP in the epigastrium, nontender. No rebound or guarding. Normal bowel sounds. No appreciable masses or hepatomegaly. Rectal:  Not performed.  Msk:  Symmetrical without gross deformities. Peripheral pulses intact.  Extremities:  Without edema, no deformity or joint abnormality. Limited range of motion and upper extremities normal sensation. Neurologic:  Alert and  oriented x4;  grossly normal neurologically.  Skin:   Dry and intact without significant lesions or rashes. Psychiatric: Oriented to person, place and time. Demonstrates good judgement and reason without abnormal affect or behaviors. Mild cognitive impairment   LAB RESULTS:  Recent Labs    05/10/16 1045  WBC 5.8  HGB 16.3  HCT 46.7  PLT 229   BMET  Recent Labs  05/10/16 1045  NA 142  K 3.8  CL 105  CO2 28  GLUCOSE 100*  BUN 20  CREATININE 0.73  CALCIUM 10.0   LFT  Recent Labs  05/10/16 1045  PROT 7.2  ALBUMIN 4.5  AST 67*  ALT 79*  ALKPHOS 71  BILITOT 1.2   PT/INR No results for input(s): LABPROT, INR in the last 72 hours.  STUDIES: Dg Abd Acute W/chest  05/10/2016  CLINICAL DATA:  Nausea and vomiting after eating cheeseburger yesterday EXAM: DG ABDOMEN ACUTE W/ 1V CHEST COMPARISON:  01/29/2016 FINDINGS: Cardiomediastinal silhouette is stable. Bilateral basilar scarring again noted. No acute infiltrate or pleural effusion. No pulmonary edema. There is normal small bowel gas pattern. Postcholecystectomy surgical clips are noted. No evidence of free abdominal air. IMPRESSION: Negative abdominal radiographs.  No acute cardiopulmonary disease. Electronically Signed   By: Liviu  Pop M.D.   On: 05/10/2016 10:42   PREVIOUS ENDOSCOPIES:            2012-colonoscopy with Dr. Hung-report in the computer   Impression / Plan:   Impression: 1.  Food impaction:Patient reports eating a double cheeseburger yesterday at 1 PM, 30 minutes afterwards he started vomiting his food back up, since then he has been unable to tolerate by mouth intake; at the time of interview the patient is asked to sip some ginger ale, this does come back up within 1 minute after swallowing-food impaction suspected; pt does deny gerd or heartburn symptoms but it is noted that he takes Pantoprazole 40mg qd so it is assumed he has a h/o GERD which is now controlled 2. Chronic Anticoagulation with Xarelto: Patient reports being placed on blood thinner 3 months ago due to DVT 3. Loose stools: Patient reports 2 episodes of liquid stool, one at 5 AM this morning and another at 8:30, none since; question relation to recent fatty diet, if continues would recommend further evaluation in outpatient setting  Plan: 1. EGD with Dr. Danis for removal of food impaction. Risks, benefits, limitations and alternatives were discussed with the patient and he agrees to proceed. The patient is aware that he may need a repeat procedure in the future for dilation purposes off anticoagulation. 2. Patient to remain nothing by mouth 3. Discussed above with Dr. Danis, please await any further recommendations after time procedure  Thank you for your kind consultation, we will continue to follow.  Jennifer Lynne Lemmon  05/10/2016, 2:57 PM Pager #: [phone]  I have reviewed the entire case in detail with the above APP and discussed the plan in detail.  Therefore, I agree with the diagnoses recorded above. In addition,  I have personally interviewed and examined the patient and have personally reviewed any abdominal/pelvic CT scan images.  My additional thoughts are as follows:  CC: Man with muscular dystrophy, edentulous, with food impaction. On xarelto, so will not be able to dilate any stricture that may be found.    Neema Fluegge L Danis III Pager [phone]  Mon-Fri 8a-5p [phone] after  5p, weekends, holidays  

## 2016-05-10 NOTE — H&P (View-Only) (Signed)
Consultation  Referring Provider:  Dr. Cyndie Chime    Primary Care Physician:  Caryl Ada, DO Primary Gastroenterologist: Dr. Elnoria Howard        Reason for Consultation:  Food impaction            HPI:   Malik [name] is a 50 y.o. Caucasian male with a past medical history positive for myotonic muscular dystrophy, hypertension, reflux and recent addition of Xarelto for anticoagulation for DVT prophylaxis within the past 3 months, who presented to the ER today with a complaint of "unable to keep anything down". At the time of my interview, it is apparent that the patient does have some mild cognitive impairment. He tells me that he ate a double cheeseburger yesterday around 1:00 and 30 minutes after this he started vomiting up his cheeseburger, which felt like it "never went down". He explains that he did try to drink something right after this and he vomited that as well. He tells me he also tried to eat something for dinner, which didn't stay down. Associated symptoms include 2 episodes of loose stool, at 5 AM and then again at 8:30.  At the time of my interview patient is asked to drink some ginger ale and after taking a long slurp it is noted that this only stays down for 1 minute before he regurgitates it into the emesis bag.  Past medical history is positive for recently being placed on Xarelto due to DVT 3 months ago.  Patient denies fever, chills, anorexia, previous dysphagia, reflux, nausea, vomiting, abdominal pain, melena, hematochezia, use of NSAIDs or previous episodes of the same.  Past Medical History  Diagnosis Date  . 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HCC)   . Hypertension   . Hiccups   . High blood pressure     Past Surgical History  Procedure Laterality Date  . Multiple tooth extractions  2007    Pt had all teeth pulled, has not yet gotten dentures.   . Cholecystectomy      Family History  Problem Relation Age of Onset  . COPD Mother   . Diabetes Mother     Social  History  Substance Use Topics  . Smoking status: Current Some Day Smoker -- 0.30 packs/day    Types: Cigarettes  . Smokeless tobacco: Never Used     Comment: Pt states that he has not bought any since mid september, but will smoke if he finds one  . Alcohol Use: No    Prior to Admission medications   Medication Sig Start Date End Date Taking? Authorizing Provider  atorvastatin (LIPITOR) 40 MG tablet Take 40 mg by mouth daily.   Yes Historical Provider, MD  Chlorpheniramine Maleate (ALLERGY RELIEF PO) Take 1 tablet by mouth 3 (three) times daily as needed (itching).   Yes Historical Provider, MD  fenofibrate (TRICOR) 145 MG tablet Take 145 mg by mouth daily.   Yes Historical Provider, MD  hydrochlorothiazide (MICROZIDE) 12.5 MG capsule TAKE (1) CAPSULE BY MOUTH ONCE DAILY. Needs PCP follow up. 03/06/15  Yes [name], MD  metoCLOPramide (REGLAN) 10 MG tablet TAKE 1 TABLET BY MOUTH EVERY 8 HOURS AS NEEDED FOR HICCUPS/NAUSEA. 09/06/14  Yes [name], MD  mirtazapine (REMERON) 15 MG tablet TAKE (1) TABLET BY MOUTH AT BEDTIME. Needs PCP follow up. 03/06/15  Yes [name], MD  pantoprazole (PROTONIX) 40 MG tablet Take 1 tablet (40 mg total) by mouth daily. 03/06/15  Yes [name], MD  rivaroxaban (  XARELTO) 20 MG TABS tablet Take 1 tablet (20 mg total) by mouth daily with supper. 02/03/16  Yes Pincus LargeJazma Y Phelps, DO  traMADol-acetaminophen (ULTRACET) 37.5-325 MG tablet Take 1 tablet by mouth every 8 (eight) hours as needed for moderate pain. 03/25/16  Yes Pincus LargeJazma Y Phelps, DO    No current facility-administered medications for this encounter.   Current Outpatient Prescriptions  Medication Sig Dispense Refill  . atorvastatin (LIPITOR) 40 MG tablet Take 40 mg by mouth daily.    . Chlorpheniramine Maleate (ALLERGY RELIEF PO) Take 1 tablet by mouth 3 (three) times daily as needed (itching).    . fenofibrate (TRICOR) 145 MG tablet Take 145 mg by mouth daily.    .  hydrochlorothiazide (MICROZIDE) 12.5 MG capsule TAKE (1) CAPSULE BY MOUTH ONCE DAILY. Needs PCP follow up. 30 capsule 1  . metoCLOPramide (REGLAN) 10 MG tablet TAKE 1 TABLET BY MOUTH EVERY 8 HOURS AS NEEDED FOR HICCUPS/NAUSEA. 30 tablet 0  . mirtazapine (REMERON) 15 MG tablet TAKE (1) TABLET BY MOUTH AT BEDTIME. Needs PCP follow up. 30 tablet 0  . pantoprazole (PROTONIX) 40 MG tablet Take 1 tablet (40 mg total) by mouth daily. 30 tablet 2  . rivaroxaban (XARELTO) 20 MG TABS tablet Take 1 tablet (20 mg total) by mouth daily with supper. 30 tablet 1  . traMADol-acetaminophen (ULTRACET) 37.5-325 MG tablet Take 1 tablet by mouth every 8 (eight) hours as needed for moderate pain. 90 tablet 0    Allergies as of 05/10/2016  . (No Known Allergies)     Review of Systems:    Constitutional: No weight loss, fever, chills, weakness or fatigue HEENT: Eyes: No Change in vision               Ears, Nose, Throat:  No change in hearing Skin: No new rash Cardiovascular: No chest pain or palpitations   Respiratory: No SOB or cough Gastrointestinal: See HPI and otherwise negative Genitourinary: No dysuria or change in urinary frequency Neurological: No headache or dizziness Musculoskeletal: No new muscle or back pain Hematologic: No anemia Psychiatric: No history of depression or anxiety   Physical Exam:  Vital signs in last 24 hours: Temp:  [97.7 F (36.5 C)] 97.7 F (36.5 C) (07/10 0919) Pulse Rate:  [66-71] 66 (07/10 1343) Resp:  [15-17] 15 (07/10 1343) BP: (109-135)/(81-83) 135/83 mmHg (07/10 1343) SpO2:  [94 %-96 %] 96 % (07/10 1343)   General:  Caucasian male appears to be in NAD, Well developed, Well nourished, alert and cooperative Head:  Normocephalic and atraumatic. Eyes:   PEERL, EOMI. No icterus. Conjunctiva pink. Ears:  Normal auditory acuity. Neck:  Supple Throat: Oral cavity and pharynx without inflammation, swelling or lesion. Poor dentition Lungs: Respirations even and  unlabored. Crackles present right anterior lung base  Heart: Normal S1, S2. No MRG. Regular rate and rhythm. No peripheral edema, cyanosis or pallor.  Abdomen:  Soft, mild TTP in the epigastrium, nontender. No rebound or guarding. Normal bowel sounds. No appreciable masses or hepatomegaly. Rectal:  Not performed.  Msk:  Symmetrical without gross deformities. Peripheral pulses intact.  Extremities:  Without edema, no deformity or joint abnormality. Limited range of motion and upper extremities normal sensation. Neurologic:  Alert and  oriented x4;  grossly normal neurologically.  Skin:   Dry and intact without significant lesions or rashes. Psychiatric: Oriented to person, place and time. Demonstrates good judgement and reason without abnormal affect or behaviors. Mild cognitive impairment   LAB RESULTS:  Recent Labs  05/10/16 1045  WBC 5.8  HGB 16.3  HCT 46.7  PLT 229   BMET  Recent Labs  05/10/16 1045  NA 142  K 3.8  CL 105  CO2 28  GLUCOSE 100*  BUN 20  CREATININE 0.73  CALCIUM 10.0   LFT  Recent Labs  05/10/16 1045  PROT 7.2  ALBUMIN 4.5  AST 67*  ALT 79*  ALKPHOS 71  BILITOT 1.2   PT/INR No results for input(s): LABPROT, INR in the last 72 hours.  STUDIES: Dg Abd Acute W/chest  05/10/2016  CLINICAL DATA:  Nausea and vomiting after eating cheeseburger yesterday EXAM: DG ABDOMEN ACUTE W/ 1V CHEST COMPARISON:  01/29/2016 FINDINGS: Cardiomediastinal silhouette is stable. Bilateral basilar scarring again noted. No acute infiltrate or pleural effusion. No pulmonary edema. There is normal small bowel gas pattern. Postcholecystectomy surgical clips are noted. No evidence of free abdominal air. IMPRESSION: Negative abdominal radiographs.  No acute cardiopulmonary disease. Electronically Signed   By: Natasha Mead M.D.   On: 05/10/2016 10:42   PREVIOUS ENDOSCOPIES:            2012-colonoscopy with Dr. Clyde Lundborg in the computer   Impression / Plan:   Impression: 1.  Food impaction:Patient reports eating a double cheeseburger yesterday at 1 PM, 30 minutes afterwards he started vomiting his food back up, since then he has been unable to tolerate by mouth intake; at the time of interview the patient is asked to sip some ginger ale, this does come back up within 1 minute after swallowing-food impaction suspected; pt does deny gerd or heartburn symptoms but it is noted that he takes Pantoprazole  qd so it is assumed he has a h/o GERD which is now controlled 2. Chronic Anticoagulation with Xarelto: Patient reports being placed on blood thinner 3 months ago due to DVT 3. Loose stools: Patient reports 2 episodes of liquid stool, one at 5 AM this morning and another at 8:30, none since; question relation to recent fatty diet, if continues would recommend further evaluation in outpatient setting  Plan: 1. EGD with Dr. [name] for removal of food impaction. Risks, benefits, limitations and alternatives were discussed with the patient and he agrees to proceed. The patient is aware that he may need a repeat procedure in the future for dilation purposes off anticoagulation. 2. Patient to remain nothing by mouth 3. Discussed above with Dr. [name], please await any further recommendations after time procedure  Thank you for your kind consultation, we will continue to follow.  Violet Baldy Lemmon  05/10/2016, 2:57 PM Pager #: [phone]  I have reviewed the entire case in detail with the above APP and discussed the plan in detail.  Therefore, I agree with the diagnoses recorded above. In addition,  I have personally interviewed and examined the patient and have personally reviewed any abdominal/pelvic CT scan images.  My additional thoughts are as follows:  CC: Man with muscular dystrophy, edentulous, with food impaction. On xarelto, so will not be able to dilate any stricture that may be found.    Charlie Pitter III Pager [phone]  Mon-Fri 8a-5p [phone] after  5p, weekends, holidays

## 2016-05-10 NOTE — ED Notes (Signed)
Pt was informed about Urinalysis 

## 2016-05-10 NOTE — ED Notes (Signed)
Informed patient that Dr Cyndie ChimeNguyen would like patient to eat something to see how he tolerates it.  Patient refusing to eat anything at this time.   Male visitor in room with patient states he vomited once about an hour ago.

## 2016-05-10 NOTE — Transfer of Care (Signed)
Immediate Anesthesia Transfer of Care Note  Patient: Malik KocherRobert [name]  Procedure(s) Performed: Procedure(s) with comments: ESOPHAGOGASTRODUODENOSCOPY (EGD) (N/A) - Please discuss sedation possibilities with Dr. Myrtie Neitheranis  Patient Location: ENDO  Anesthesia Type:General  Level of Consciousness:  sedated, patient cooperative and responds to stimulation  Airway & Oxygen Therapy:Patient Spontanous Breathing and Patient connected to face mask oxgen  Post-op Assessment:  Report given to ENDO RN and Post -op Vital signs reviewed and stable  Post vital signs:  Reviewed and stable  Last Vitals:  Filed Vitals:   05/10/16 1343 05/10/16 1549  BP: 135/83 140/85  Pulse: 66 68  Temp:  36.6 C  Resp: 15 14    Complications: No apparent anesthesia complications

## 2016-05-10 NOTE — Discharge Instructions (Signed)
You were seen and evaluated today for your vomiting and pain. It was found that you had hamburger stuck in your esophagus. This was cleared by the gastroenterologist. Please follow-up outpatient with your primary care physician. Try to eat a soft diet for the next few days. Return with return of symptoms or inability to swallow.

## 2016-05-10 NOTE — ED Notes (Signed)
Patient refusing to take any sips of oral fluids offered.

## 2016-05-10 NOTE — Interval H&P Note (Signed)
History and Physical Interval Note:  05/10/2016 4:11 PM  Malik KocherRobert Riebe  has presented today for surgery, with the diagnosis of food impaction  The various methods of treatment have been discussed with the patient and family. After consideration of risks, benefits and other options for treatment, the patient has consented to  Procedure(s) with comments: ESOPHAGOGASTRODUODENOSCOPY (EGD) (N/A) - Please discuss sedation possibilities with Dr. Myrtie Neitheranis as a surgical intervention .  The patient's history has been reviewed, patient examined, no change in status, stable for surgery.  I have reviewed the patient's chart and labs.  Questions were answered to the patient's satisfaction.     Charlie PitterHenry L Danis III

## 2016-05-10 NOTE — ED Notes (Signed)
Bed: [id number] Expected date:  Expected time:  Means of arrival:  Comments: Pt in ENDO

## 2016-05-10 NOTE — ED Provider Notes (Signed)
CSN: [id number]     Arrival date & time 05/10/16  0920 History   First MD Initiated Contact with Patient 05/10/16 [phone]     Chief Complaint  Patient presents with  . Emesis  . Diarrhea     (Consider location/radiation/quality/duration/timing/severity/associated sxs/prior Treatment) HPI Comments: 50 year old male presents for vomiting and diarrhea. The patient states that he has had a feeling of something being stuck in his throat ever since eating a hamburger yesterday at [employer]. He also reports that 3 AM he had 3 episodes of loose stool. He reports that the pain he is feeling is only in this feeling of something being stuck in not been able to swallow. He has been handling his own secretions. He said he has not eaten or drank anything since 4 PM yesterday when a hamburger meat felt like it got stuck. He denies fevers or chills. He denies this ever happening before.   Past Medical History  Diagnosis Date  . 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HCC)   . Hypertension   . Hiccups   . High blood pressure    Past Surgical History  Procedure Laterality Date  . Multiple tooth extractions  2007    Pt had all teeth pulled, has not yet gotten dentures.   . Cholecystectomy     Family History  Problem Relation Age of Onset  . COPD Mother   . Diabetes Mother    Social History  Substance Use Topics  . Smoking status: Current Some Day Smoker -- 0.30 packs/day    Types: Cigarettes  . Smokeless tobacco: Never Used     Comment: Pt states that he has not bought any since mid september, but will smoke if he finds one  . Alcohol Use: No    Review of Systems  Constitutional: Negative for chills, fatigue and unexpected weight change.  HENT: Positive for trouble swallowing. Negative for congestion.   Eyes: Negative for visual disturbance.  Respiratory: Negative for cough, chest tightness and shortness of breath.   Cardiovascular: Negative for chest pain and palpitations.  Gastrointestinal: Positive  for nausea, vomiting, abdominal pain and diarrhea. Negative for constipation.  Genitourinary: Negative for dysuria, urgency and hematuria.  Musculoskeletal: Negative for myalgias and back pain.  Skin: Negative for rash.  Neurological: Negative for dizziness, weakness and headaches.  Hematological: Does not bruise/bleed easily.      Allergies  Review of patient's allergies indicates no known allergies.  Home Medications   Prior to Admission medications   Medication Sig Start Date End Date Taking? Authorizing Provider  atorvastatin (LIPITOR) 40 MG tablet Take 40 mg by mouth daily.   Yes Historical Provider, MD  Chlorpheniramine Maleate (ALLERGY RELIEF PO) Take 1 tablet by mouth 3 (three) times daily as needed (itching).   Yes Historical Provider, MD  fenofibrate (TRICOR) 145 MG tablet Take 145 mg by mouth daily.   Yes Historical Provider, MD  hydrochlorothiazide (MICROZIDE) 12.5 MG capsule TAKE (1) CAPSULE BY MOUTH ONCE DAILY. Needs PCP follow up. 03/06/15  Yes Leona SingletonMaria T [name], MD  metoCLOPramide (REGLAN) 10 MG tablet TAKE 1 TABLET BY MOUTH EVERY 8 HOURS AS NEEDED FOR HICCUPS/NAUSEA. 09/06/14  Yes Leona SingletonMaria T [name], MD  mirtazapine (REMERON) 15 MG tablet TAKE (1) TABLET BY MOUTH AT BEDTIME. Needs PCP follow up. 03/06/15  Yes Leona SingletonMaria T [name], MD  pantoprazole (PROTONIX) 40 MG tablet Take 1 tablet (40 mg total) by mouth daily. 03/06/15  Yes Leona SingletonMaria T [name], MD  rivaroxaban (XARELTO) 20 MG TABS tablet Take 1 tablet (  20 mg total) by mouth daily with supper. 02/03/16  Yes Pincus Large, DO  traMADol-acetaminophen (ULTRACET) 37.5-325 MG tablet Take 1 tablet by mouth every 8 (eight) hours as needed for moderate pain. 03/25/16  Yes Jazma Y Phelps, DO   BP 133/90 mmHg  Pulse 78  Temp(Src) 98.1 F (36.7 C) (Oral)  Resp 14  Ht  (1.727 m)  Wt 118 lb (53.524 kg)  BMI 17.95 kg/m2  SpO2 96% Physical Exam  Constitutional: He is oriented to person, place, and time. He appears  well-developed and well-nourished. No distress.  HENT:  Head: Normocephalic and atraumatic.  Right Ear: External ear normal.  Left Ear: External ear normal.  Mouth/Throat: Oropharynx is clear and moist. No oropharyngeal exudate.  Eyes: EOM are normal. Pupils are equal, round, and reactive to light.  Neck: Normal range of motion. Neck supple.  Cardiovascular: Normal rate, regular rhythm, normal heart sounds and intact distal pulses.   No murmur heard. Pulmonary/Chest: Effort normal. No respiratory distress. He has no wheezes. He has no rales.  Abdominal: Soft. He exhibits no distension. There is no tenderness.  Musculoskeletal: He exhibits no edema.  Neurological: He is alert and oriented to person, place, and time.  Skin: Skin is warm and dry. No rash noted. He is not diaphoretic.  Vitals reviewed.   ED Course  Procedures (including critical care time) Labs Review Labs Reviewed  COMPREHENSIVE METABOLIC PANEL - Abnormal; Notable for the following:    Glucose, Bld 100 (*)    AST 67 (*)    ALT 79 (*)    All other components within normal limits  URINALYSIS, ROUTINE W REFLEX MICROSCOPIC (NOT AT [facility]) - Abnormal; Notable for the following:    Color, Urine AMBER (*)    APPearance TURBID (*)    Bilirubin Urine SMALL (*)    All other components within normal limits  URINE MICROSCOPIC-ADD ON - Abnormal; Notable for the following:    Squamous Epithelial / LPF 0-5 (*)    Bacteria, UA FEW (*)    All other components within normal limits  CBC WITH DIFFERENTIAL/PLATELET  LIPASE, BLOOD  I-STAT CG4 LACTIC ACID, ED  I-STAT TROPOININ, ED  I-STAT CG4 LACTIC ACID, ED    Imaging Review Dg Abd Acute W/chest  05/10/2016  CLINICAL DATA:  Nausea and vomiting after eating cheeseburger yesterday EXAM: DG ABDOMEN ACUTE W/ 1V CHEST COMPARISON:  01/29/2016 FINDINGS: Cardiomediastinal silhouette is stable. Bilateral basilar scarring again noted. No acute infiltrate or pleural effusion. No pulmonary  edema. There is normal small bowel gas pattern. Postcholecystectomy surgical clips are noted. No evidence of free abdominal air. IMPRESSION: Negative abdominal radiographs.  No acute cardiopulmonary disease. Electronically Signed   By: Natasha Mead M.D.   On: 05/10/2016 10:42   I have personally reviewed and evaluated these images and lab results as part of my medical decision-making.   EKG Interpretation   Date/Time:  Monday May 10 2016 10:12:42 EDT Ventricular Rate:  62 PR Interval:    QRS Duration: 89 QT Interval:  412 QTC Calculation: 419 R Axis:   -4 Text Interpretation:  Sinus rhythm Left ventricular hypertrophy Similar ST  segment morphology as previous ekg Confirmed by Shalondra Wunschel ([id number]) on  05/10/2016 12:14:34 PM      MDM  Patient was seen and evaluated in stable condition. Benign examination. Patient tolerating his own secretions. Laboratory studies and acute abdominal series unremarkable. Patient continued to feel unwell. He was unable to swallow ginger ale. Glucagon was  attempted without success. GI was consult it. He was taken to the endoscopy suite where the impacted hamburger was cleared. He returned in stable condition. He was able to tolerate orals without difficulty. He said he felt better and was requesting discharge. He was discharged home in stable condition. Final diagnoses:  Food impaction of esophagus, initial encounter    1. Food impaction     Leta Baptist, MD 05/10/16 1743

## 2016-05-10 NOTE — Progress Notes (Signed)
Report called to ED RN Lyla Sonarrie.  Pt transported via stretcher from Endo dept back to previous ED room with family and all belongings.  Pt back on EKG monitor.  Roselie AwkwardShannon Samier Jaco, RN

## 2016-05-10 NOTE — ED Notes (Signed)
Patient transported to X-ray 

## 2016-05-11 ENCOUNTER — Encounter (HOSPITAL_COMMUNITY): Payer: Self-pay | Admitting: Gastroenterology

## 2016-05-11 NOTE — Anesthesia Postprocedure Evaluation (Signed)
Anesthesia Post Note  Patient: Malik KocherRobert [name]  Procedure(s) Performed: Procedure(s) (LRB): ESOPHAGOGASTRODUODENOSCOPY (EGD) (N/A)  Patient location during evaluation: Endoscopy Anesthesia Type: General Level of consciousness: awake and alert Pain management: pain level controlled Vital Signs Assessment: post-procedure vital signs reviewed and stable Respiratory status: spontaneous breathing, nonlabored ventilation, respiratory function stable and patient connected to nasal cannula oxygen Cardiovascular status: blood pressure returned to baseline and stable Postop Assessment: no signs of nausea or vomiting Anesthetic complications: no    Last Vitals:  Filed Vitals:   05/10/16 1814 05/10/16 2005  BP: 138/96 137/83  Pulse: 86 74  Temp:    Resp: 19 14    Last Pain:  Filed Vitals:   05/10/16 2006  PainSc: 0-No pain                 Cecile HearingStephen Edward Kensie Susman

## 2016-06-01 ENCOUNTER — Ambulatory Visit (INDEPENDENT_AMBULATORY_CARE_PROVIDER_SITE_OTHER): Payer: Medicaid Other | Admitting: Obstetrics and Gynecology

## 2016-06-01 ENCOUNTER — Encounter: Payer: Self-pay | Admitting: Obstetrics and Gynecology

## 2016-06-01 VITALS — BP 133/90 | HR 79 | Temp 98.0°F

## 2016-06-01 DIAGNOSIS — G7111 Myotonic muscular dystrophy: Secondary | ICD-10-CM

## 2016-06-01 DIAGNOSIS — M79605 Pain in left leg: Secondary | ICD-10-CM | POA: Diagnosis not present

## 2016-06-01 DIAGNOSIS — I82622 Acute embolism and thrombosis of deep veins of left upper extremity: Secondary | ICD-10-CM

## 2016-06-01 DIAGNOSIS — M79604 Pain in right leg: Secondary | ICD-10-CM | POA: Diagnosis not present

## 2016-06-01 NOTE — Patient Instructions (Signed)
Schedule appointment up front for pharmacy clinic for ABIs You can now stop taking Xarelto. You completed your course of medication for 3 months.

## 2016-06-01 NOTE — Progress Notes (Signed)
     Subjective: Chief Complaint  Patient presents with  . Follow-up     HPI: Malik [name] is a 50 y.o. male with PMH significant for myotonic muscular dystrophy and hypertension who is presenting to clinic today to discuss the following:  #Leg Pain -continues to have intermittent leg pain Occurs in both legs but R>L -describes it as throbbing -worse with exertion (patient unable to walk due to his MD but can stand and move legs minimally) -never had this before -has been going on for the last couple of months -does not think this pain is associated with is MD -pain controlled when he takes his tramadol -denies associated swelling , redness, fevers  #UE DVT -h/o left arm DVT that was provoked -continues to take xarelto -denies any bleeding -deneis any pain or fullness in arm    ROS noted in HPI.  Past Medical, Surgical, Social, and Family History Reviewed & Updated per EMR. Smoking status - Current sometime smoker   Objective: BP 133/90   Pulse 79   Temp 98 F (36.7 C) (Oral)  Vitals and nursing notes reviewed  Physical Exam  Constitutional: He is oriented to person, place, and time and well-developed, well-nourished, and in no distress.  Cardiovascular: Normal rate, regular rhythm and intact distal pulses.   Pulmonary/Chest: Effort normal.  Musculoskeletal: He exhibits no edema or tenderness.  Bilateral lower extremities weak and with diminished muscle tone.   Neurological: He is alert and oriented to person, place, and time. He displays weakness. He exhibits abnormal muscle tone.  Skin: Skin is warm, dry and intact. No rash noted. No erythema.    Assessment/Plan: Please see problem based Assessment and Plan   Caryl Ada, DO 06/01/2016, 11:41 AM PGY-3, [facility]

## 2016-06-02 DIAGNOSIS — M79604 Pain in right leg: Secondary | ICD-10-CM | POA: Insufficient documentation

## 2016-06-02 DIAGNOSIS — M79605 Pain in left leg: Principal | ICD-10-CM

## 2016-06-02 NOTE — Assessment & Plan Note (Signed)
Symptoms from DVT now resolved. Has had no issues with Xarelto. Discontinue medication at this time. Patient has completed his 3 month treatment course for first DVT. Discussed with patient that if he were to get another DVT he would be on the medication long term.

## 2016-06-02 NOTE — Assessment & Plan Note (Signed)
Intermittent throbbing leg pain with minimal exertion. Unsure of etiology at this time. Suspect it is due to his muscle dystrophy even though patient states otherwise. Has had work-up for this at prior visit. Reviewed LE [place] and it was negative for DVT. He has no injury history. There is no swelling or erythema. He has significant muscle atony. Considering arterial insufficiency given smoking history. Patient to schedule appt in pharmacy clinic for ABIs. Continue tramadol prn for pain control. Follow-up in 4-6 weeks.

## 2016-06-02 NOTE — Assessment & Plan Note (Addendum)
Has generalized muscle weakness. Unable to ambulate and is wheelchair bound. Stays in a long term facility. No longer follows with neurology. Condition appears stable. Will monitor.

## 2016-06-04 ENCOUNTER — Ambulatory Visit: Payer: Medicaid Other | Admitting: Pharmacist

## 2016-06-07 ENCOUNTER — Ambulatory Visit (INDEPENDENT_AMBULATORY_CARE_PROVIDER_SITE_OTHER): Payer: Medicaid Other | Admitting: Pharmacist

## 2016-06-07 ENCOUNTER — Encounter: Payer: Self-pay | Admitting: Pharmacist

## 2016-06-07 DIAGNOSIS — I739 Peripheral vascular disease, unspecified: Secondary | ICD-10-CM | POA: Diagnosis not present

## 2016-06-07 MED ORDER — ASPIRIN 81 MG PO TABS: 81.0000 mg | ORAL_TABLET | Freq: Every day | ORAL | Status: DC

## 2016-06-07 NOTE — Progress Notes (Signed)
Patient ID: Malik KocherRobert Tsou, male   DOB: 29-Mar-1966, 50 y.o.   MRN: [id number] Reviewed:  Agree with Dr. Macky LowerKoval's documentation and management.

## 2016-06-07 NOTE — Progress Notes (Signed)
I saw this patient in conjunction with Dr. Raymondo BandKoval and the pharmacy team.  Subjective: Patient seen in office for ABI. Noted to have poor foot hygiene/extra skin and severe PAD by ABI.   Objective: Foot exam - unable to palpate either DP or PT pulses bilaterally, feet are warm and appear adequately perfused. 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 Formal referral for ABI/vascular studies. As feet appear adequately perfused at this time emergent evaluation/treatment is not warranted. 2. Order for foot soaks given on referral from from living facility  Donnella ShamKyle Sanford Lindblad MD

## 2016-06-07 NOTE — Assessment & Plan Note (Signed)
Severe PAD based on ABI of 0.29 in a patient with symptoms of leg pain and lack of pulses bilaterally.  Aspirin 81 mg initiated. Pt sent for formal vascular lab for further analysis.  Results reviewed with patient and care advocate and written information provided for the facility.  F/U Clinic Visit with Dr. Doroteo GlassmanPhelps after formal vascular evaluation.

## 2016-06-07 NOTE — Progress Notes (Signed)
    S:    Patient arrives in somnolent mood requiring assistance in a wheelchair. He presents to the clinic for PADABI evaluation.  Patient was referred on 06/01/16 by Dr. Doroteo [name].  Patient was last seen by Primary Care Provider on 06/01/16.  Patient is unable to walk and wheel-chair bound. Self-reports losing his ability to stand approximately 3.5 years ago with muscular dystrophy diagnosis ~4 years prior. Pain is localized in right shin bone and described as a "throbbing pain." Pt reports pain is only alleviated with pain medications.   Complains of longstanding tiredness (from years ago)   O:  Lower extremity Physical Exam includes delayed capillary refill in left ankle,diminished limb hair, mild edema of both lower extremities and thickened brittle nails. Poor lower extremity hygiene including thickened plantar tissue, flaking dorsal skin tissue and interdigit filth.  No obvious wounds.   Also evaluated by Dr. Randolm [name].    ABI overall = 0.29 Right Arm 102 mmHg    Left Arm 110 mmHg Right ankle posterior tibial 32 mmHg     dorsalis pedis -- mmHg Left ankle posterior tibial -- mmHg    dorsalis pedis -- mmHg Diminished pulses present in right posterior tibial. Absent pulses in right dorsalis pedis, left dorsalis pedis and left posterior tibial with vascular doppler.     A/P: Severe PAD based on ABI of 0.29 in a patient with symptoms of leg pain and lack of pulses bilaterally.  Aspirin 81 mg initiated. Pt sent for formal vascular lab for further analysis.  Results reviewed with patient and care advocate and written information provided for the facility.  F/U Clinic Visit with Dr. Doroteo [name] after formal vascular evaluation.   Foot hygiene recommendations and referral to podiatry were suggested on form for the nursing home by Dr. Randolm [name] including 3x weekly foot soaks.   Reevaluate hygiene of feet at next office visit suggested.    Total time in face-to-face counseling 45 minutes.  Patient seen  with Malik [name], PharmD Candidate.

## 2016-06-07 NOTE — Patient Instructions (Addendum)
It was nice seeing you today!   We recommend you start taking 81 mg aspirin to help prevent heart attacks and stroke. Continue taking your regular medications as indicated.  Follow up with Vascular Lab.  Visit scheduled for 8/10

## 2016-06-10 ENCOUNTER — Ambulatory Visit (HOSPITAL_COMMUNITY): Payer: Medicaid Other | Attending: Family Medicine

## 2016-06-21 ENCOUNTER — Ambulatory Visit (INDEPENDENT_AMBULATORY_CARE_PROVIDER_SITE_OTHER): Payer: Medicaid Other | Admitting: Obstetrics and Gynecology

## 2016-06-21 ENCOUNTER — Encounter: Payer: Self-pay | Admitting: Obstetrics and Gynecology

## 2016-06-21 NOTE — Progress Notes (Signed)
Encounter opened in error. Appointment was supposed to be made for vascular lab but was put on my clinic. Will have follow-up appointment after his vascular imaging.

## 2016-06-25 ENCOUNTER — Telehealth: Payer: Self-pay | Admitting: *Deleted

## 2016-06-25 ENCOUNTER — Ambulatory Visit (HOSPITAL_COMMUNITY)
Admission: RE | Admit: 2016-06-25 | Discharge: 2016-06-25 | Disposition: A | Discharge status: Home / Self Care | Payer: Medicaid Other | Source: Ambulatory Visit | Attending: Family Medicine | Admitting: Family Medicine

## 2016-06-25 DIAGNOSIS — I739 Peripheral vascular disease, unspecified: Secondary | ICD-10-CM

## 2016-06-25 NOTE — Telephone Encounter (Signed)
Will forward to MD to make aware. Hilda Rynders,CMA  

## 2016-06-25 NOTE — Telephone Encounter (Signed)
Received a call from [facility] needing an order for an Arterial.  Patient's AVI was abnormal.  Please give them a call at [phone].  Patient would need to go to clinic [address]; please call [phone] to schedule appt once order is placed.  Clovis PuMartin, Tamika L, RN

## 2016-06-28 NOTE — Telephone Encounter (Signed)
Order for referral to vascular surgery placed. Please schedule patient an appt. Thanks

## 2016-07-07 ENCOUNTER — Other Ambulatory Visit: Payer: Self-pay | Admitting: Vascular Surgery

## 2016-07-07 DIAGNOSIS — I739 Peripheral vascular disease, unspecified: Secondary | ICD-10-CM

## 2016-08-03 ENCOUNTER — Encounter: Payer: Self-pay | Admitting: Vascular Surgery

## 2016-08-06 ENCOUNTER — Inpatient Hospital Stay (HOSPITAL_COMMUNITY): Admission: RE | Admit: 2016-08-06 | Payer: Medicaid Other | Source: Ambulatory Visit

## 2016-08-06 ENCOUNTER — Encounter: Payer: Medicaid Other | Admitting: Vascular Surgery

## 2016-08-09 ENCOUNTER — Telehealth: Payer: Self-pay | Admitting: Obstetrics and Gynecology

## 2016-08-09 ENCOUNTER — Ambulatory Visit (HOSPITAL_BASED_OUTPATIENT_CLINIC_OR_DEPARTMENT_OTHER)
Admission: RE | Admit: 2016-08-09 | Discharge: 2016-08-09 | Disposition: A | Discharge status: Home / Self Care | Payer: Medicaid Other | Source: Ambulatory Visit | Attending: Vascular Surgery | Admitting: Vascular Surgery

## 2016-08-09 ENCOUNTER — Encounter: Payer: Medicaid Other | Admitting: Vascular Surgery

## 2016-08-09 ENCOUNTER — Ambulatory Visit (HOSPITAL_COMMUNITY)
Admission: RE | Admit: 2016-08-09 | Discharge: 2016-08-09 | Disposition: A | Discharge status: Home / Self Care | Payer: Medicaid Other | Source: Ambulatory Visit | Attending: Vascular Surgery | Admitting: Vascular Surgery

## 2016-08-09 DIAGNOSIS — I70203 Unspecified atherosclerosis of native arteries of extremities, bilateral legs: Secondary | ICD-10-CM | POA: Insufficient documentation

## 2016-08-09 DIAGNOSIS — I739 Peripheral vascular disease, unspecified: Secondary | ICD-10-CM

## 2016-08-09 LAB — VAS US LOWER EXTREMITY ARTERIAL DUPLEX
Left popliteal prox sys PSV: 14 cm/s
Left super femoral mid sys PSV: 11 cm/s
Left super femoral prox sys PSV: 26 cm/s
RIGHT POST TIB DIST SYS: 14 cm/s
RIGHT POST TIB MID SYS: 11 cm/s
Right popliteal dist sys PSV: -18 cm/s
Right popliteal prox sys PSV: -32 cm/s
Right super femoral dist sys PSV: -16 cm/s
Right super femoral mid sys PSV: 22 cm/s
Right super femoral prox sys PSV: 29 cm/s

## 2016-08-09 NOTE — Progress Notes (Signed)
VASCULAR LAB PRELIMINARY  PRELIMINARY  PRELIMINARY  PRELIMINARY  Bilateral lower extremity arterial duplex completed.    Preliminary report:  Significant plaque throughout, bilaterally.  Minimal flow noted in the distal right femoral, popliteal  and posterior tibial arteries. No flow noted in the right anterior tibial artery. Thumping waveform noted in the mid left femoral artery with no flow noted in the distal left femoral artery.  Thumping waveform noted in the proximal popliteal artery with no flow noted in the distal popliteal, posterior tibial, or anterior tibial artery.   Alessandro Griep, RVT 08/09/2016, 10:54 AM

## 2016-08-09 NOTE — Telephone Encounter (Signed)
His aunt stated the nursing home called her reqeusting refills.  He needs all of them. No list was provided.  Fax # [phone].  He is a resident at [facility] on [address]

## 2016-08-10 ENCOUNTER — Encounter: Payer: Self-pay | Admitting: Vascular Surgery

## 2016-08-10 ENCOUNTER — Other Ambulatory Visit: Payer: Self-pay | Admitting: *Deleted

## 2016-08-10 DIAGNOSIS — M79604 Pain in right leg: Secondary | ICD-10-CM

## 2016-08-10 MED ORDER — METOCLOPRAMIDE HCL 10 MG PO TABS: ORAL_TABLET | ORAL | 0 refills | Status: DC

## 2016-08-10 MED ORDER — FENOFIBRATE 145 MG PO TABS: 145.0000 mg | ORAL_TABLET | Freq: Every day | ORAL | 11 refills | Status: DC

## 2016-08-10 MED ORDER — ATORVASTATIN CALCIUM 40 MG PO TABS: 40.0000 mg | ORAL_TABLET | Freq: Every day | ORAL | 11 refills | Status: DC

## 2016-08-10 MED ORDER — ASPIRIN 81 MG PO TABS: 81.0000 mg | ORAL_TABLET | Freq: Every day | ORAL | 11 refills | Status: DC

## 2016-08-10 MED ORDER — MIRTAZAPINE 15 MG PO TABS: ORAL_TABLET | ORAL | 6 refills | Status: DC

## 2016-08-10 MED ORDER — HYDROCHLOROTHIAZIDE 12.5 MG PO CAPS: ORAL_CAPSULE | ORAL | 11 refills | Status: DC

## 2016-08-10 MED ORDER — PANTOPRAZOLE SODIUM 40 MG PO TBEC: 40.0000 mg | DELAYED_RELEASE_TABLET | Freq: Every day | ORAL | 6 refills | Status: DC

## 2016-08-10 NOTE — Telephone Encounter (Signed)
Refilled medications I have on file. If others need to be refilled I need to know the name.

## 2016-08-11 ENCOUNTER — Encounter: Payer: Self-pay | Admitting: Obstetrics and Gynecology

## 2016-08-11 ENCOUNTER — Other Ambulatory Visit: Payer: Self-pay | Admitting: Obstetrics and Gynecology

## 2016-08-11 ENCOUNTER — Telehealth: Payer: Self-pay | Admitting: Obstetrics and Gynecology

## 2016-08-11 ENCOUNTER — Encounter: Payer: Medicaid Other | Admitting: Vascular Surgery

## 2016-08-11 DIAGNOSIS — I739 Peripheral vascular disease, unspecified: Secondary | ICD-10-CM

## 2016-08-11 MED ORDER — PANTOPRAZOLE SODIUM 40 MG PO TBEC: 40.0000 mg | DELAYED_RELEASE_TABLET | Freq: Every day | ORAL | 6 refills | Status: DC

## 2016-08-11 MED ORDER — TRAMADOL-ACETAMINOPHEN 37.5-325 MG PO TABS: 1.0000 | ORAL_TABLET | Freq: Three times a day (TID) | ORAL | 0 refills | Status: DC | PRN

## 2016-08-11 MED ORDER — FENOFIBRATE 145 MG PO TABS: 145.0000 mg | ORAL_TABLET | Freq: Every day | ORAL | 11 refills | Status: DC

## 2016-08-11 MED ORDER — MIRTAZAPINE 15 MG PO TABS: ORAL_TABLET | ORAL | 6 refills | Status: DC

## 2016-08-11 MED ORDER — HYDROCHLOROTHIAZIDE 12.5 MG PO CAPS: ORAL_CAPSULE | ORAL | 11 refills | Status: DC

## 2016-08-11 MED ORDER — ASPIRIN 81 MG PO TABS: 81.0000 mg | ORAL_TABLET | Freq: Every day | ORAL | 11 refills | Status: DC

## 2016-08-11 MED ORDER — METOCLOPRAMIDE HCL 10 MG PO TABS: ORAL_TABLET | ORAL | 0 refills | Status: DC

## 2016-08-11 MED ORDER — DIPHENHYDRAMINE HCL 25 MG PO TABS: 25.0000 mg | ORAL_TABLET | Freq: Three times a day (TID) | ORAL | 4 refills | Status: DC | PRN

## 2016-08-11 MED ORDER — ATORVASTATIN CALCIUM 40 MG PO TABS: 40.0000 mg | ORAL_TABLET | Freq: Every day | ORAL | 11 refills | Status: DC

## 2016-08-11 NOTE — Telephone Encounter (Signed)
[facility] stated papers were faxed on Monday regarding refill medication orders. They would like to know when we will fax them back. Fax # [phone]. Please advise. Thanks! ep

## 2016-08-11 NOTE — Telephone Encounter (Signed)
[facility] [place] [facility].  They only received the benadryl.  All other medications were sent to a different pharmacy.  [facility].  Will re-fax the Ultracet to correct pharmacy.   Clovis PuMartin, Tamika L, RN

## 2016-08-11 NOTE — Telephone Encounter (Signed)
Will forward to PCP to see if she will print Rx and faxed them to the pharmacy.  Clovis PuMartin, Tammey Deeg L, RN

## 2016-08-11 NOTE — Telephone Encounter (Signed)
Refilled all these medications on 10/10 (see chart). I will print off the Ultracet and try and fax.

## 2016-08-11 NOTE — Telephone Encounter (Signed)
Only received one medication for refill and filled it electronically today and sent it. Please follow-up that they received medication (benadryl prn for itching). Thanks

## 2016-08-11 NOTE — Telephone Encounter (Signed)
Thank you for correcting this.

## 2016-08-23 ENCOUNTER — Encounter: Payer: Self-pay | Admitting: Vascular Surgery

## 2016-08-25 ENCOUNTER — Encounter: Payer: Self-pay | Admitting: Vascular Surgery

## 2016-08-25 ENCOUNTER — Ambulatory Visit (INDEPENDENT_AMBULATORY_CARE_PROVIDER_SITE_OTHER): Payer: Medicaid Other | Admitting: Vascular Surgery

## 2016-08-25 VITALS — BP 106/74 | HR 69 | Temp 97.7°F | Resp 20 | Ht 68.0 in | Wt 118.0 lb

## 2016-08-25 DIAGNOSIS — I739 Peripheral vascular disease, unspecified: Secondary | ICD-10-CM

## 2016-08-25 NOTE — Progress Notes (Signed)
Referring Physician: Dr Doroteo Glassman  Patient name: Malik [name] MRN: [id number] DOB: 1966/08/12 Sex: male  REASON FOR CONSULT: Lower extremity weakness possible peripheral arterial disease  HPI: Malik [name] is a 50 y.o. 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 However over the last 3-6 months he states his legs have gotten weaker. He has not been ambulatory for several years but is able to stand briefly and transfer. He currently smokes about a half a pack of cigarettes per day. He has been smoking for 30 years. Although I discussed quitting with him he did not really seem interested. He denies history of diabetes. He did previously have a upper extremity DVT and was on Xarelto but currently is not requiring this. He does not ambulate enough to elicit claudication symptoms. He denies rest pain. He has no tissue loss. Other medical problems include hypertension which has been controlled.   Past Medical History:  Diagnosis Date  . Hiccups   . High blood pressure   . Hypertension   . 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 CHOLECYSTECTOMY    . ESOPHAGOGASTRODUODENOSCOPY N/A 05/10/2016   Procedure: ESOPHAGOGASTRODUODENOSCOPY (EGD);  Surgeon: [name], MD;  Location: [facility];  Service: Endoscopy;  Laterality: N/A;  Please discuss sedation possibilities with Dr. [name]  . MULTIPLE TOOTH EXTRACTIONS  2007   Pt had all teeth pulled, has not yet gotten dentures.     Family History  Problem Relation Age of Onset  . COPD Mother   . Diabetes Mother     SOCIAL HISTORY: Social History   Social History  . Marital status: Single    Spouse name: N/A  . Number of children: 0  . Years of education: GED   Occupational History  .      Disabled   Social History Main Topics  . Smoking status: Current Some Day Smoker    Packs/day: 0.30    Types: Cigarettes    Start date: 11/02/1983   . Smokeless tobacco: Never Used     Comment: 4-5 cigs per day  . Alcohol use No  . Drug use: No     Comment: Pt smokes MJ ocassinally.  Denies ever using IV drugs.   . Sexual activity: Not Currently    Birth control/ protection: Condom     Comment: Pt states he always uses condomns when sexually active.    Other Topics Concern  . Not on file   Social History Narrative   Patient has stated to me that he wishes to be DNR/DNI.  We have discussed the slow progression that we anticipate with his myotonic muscular dystrophy, and he does not want interventions to artificially prolong his life such as trach/vent or feeding tube.  His father who is next of kin was present for this discussion and agrees, understand's Malik [name]'s wishes.  A traveling DNR was filled out 03/09/2013, and patient given information about living will and HCPOA.       CHAMBERLAIN,RACHEL, MD   03/09/2013         Patient lives in assisted living [facility].   Disabled.   Education GED.   Right handed.   Caffeine one cup of  Coffee daily.              No Known Allergies  Current Outpatient Prescriptions  Medication Sig Dispense Refill  . aspirin 81 MG tablet Take  1 tablet (81 mg total) by mouth daily. 30 tablet 11  . atorvastatin (LIPITOR) 40 MG tablet Take 1 tablet (40 mg total) by mouth daily. 30 tablet 11  . diphenhydrAMINE (ALLERGY RELIEF) 25 MG tablet Take 1 tablet (25 mg total) by mouth 3 (three) times daily as needed (itching). 30 tablet 4  . fenofibrate (TRICOR) 145 MG tablet Take 1 tablet (145 mg total) by mouth daily. 30 tablet 11  . hydrochlorothiazide (MICROZIDE) 12.5 MG capsule TAKE (1) CAPSULE BY MOUTH ONCE DAILY. 30 capsule 11  . metoCLOPramide (REGLAN) 10 MG tablet TAKE 1 TABLET BY MOUTH EVERY 8 HOURS AS NEEDED FOR HICCUPS/NAUSEA. 30 tablet 0  . mirtazapine (REMERON) 15 MG tablet TAKE (1) TABLET BY MOUTH AT BEDTIME. 30 tablet 6  . pantoprazole (PROTONIX) 40 MG tablet Take 1 tablet (40 mg  total) by mouth daily. 30 tablet 6  . traMADol-acetaminophen (ULTRACET) 37.5-325 MG tablet Take 1 tablet by mouth every 8 (eight) hours as needed for moderate pain. 120 tablet 0   No current facility-administered medications for this visit.     ROS:   General:  No weight loss, Fever, chills  HEENT: No recent headaches, no nasal bleeding, no visual changes, no sore throat  Neurologic: No dizziness, blackouts, seizures. No recent symptoms of stroke or mini- stroke. No recent episodes of slurred speech, or temporary blindness.  Cardiac: No recent episodes of chest pain/pressure, no shortness of breath at rest.  No shortness of breath with exertion.  Denies history of atrial fibrillation or irregular heartbeat  Vascular: No history of rest pain in feet.  No history of claudication.  No history of non-healing ulcer, No history of DVT   Pulmonary: No home oxygen, no productive cough, no hemoptysis,  No asthma or wheezing  Musculoskeletal:  [ ]  Arthritis, [ ]  Low back pain,  [ ]  Joint pain  Hematologic:No history of hypercoagulable state.  No history of easy bleeding.  No history of anemia  Gastrointestinal: No hematochezia or melena,  No gastroesophageal reflux, no trouble swallowing  Urinary: [ ]  chronic Kidney disease, [ ]  on HD - [ ]  MWF or [ ]  TTHS, [ ]  Burning with urination, [ ]  Frequent urination, [ ]  Difficulty urinating;   Skin: No rashes  Psychological: No history of anxiety,  No history of depression   Physical Examination  Vitals:   08/25/16 1106  BP: 106/74  Pulse: 69  Resp: 20  Temp: 97.7 F (36.5 C)  TempSrc: Oral  SpO2: 98%  Weight: 118 lb (53.5 kg)  Height: 5\' 8"  (1.727 m)    Body mass index is 17.94 kg/m.  General:  Alert and oriented, no acute distress HEENT: Normal Neck: No bruit or JVD Pulmonary: Clear to auscultation bilaterally Cardiac: Regular Rate and Rhythm without murmur Abdomen: Soft, non-tender, non-distended, no mass Skin: No rash, no  ulcer Extremity Pulses:  2+ radial, brachial, femoral, absent dorsalis pedis, posterior tibial pulses bilaterally Musculoskeletal: No deformity or edema  Neurologic: Upper and lower extremity motor 5/5 and symmetric  DATA:  The patient had bilateral ABIs performed on 06/25/2016. I reviewed these today. ABIs were 0.6 bilaterally.  ASSESSMENT:  Patient with bilateral lower extremity weakness which shows been progressively worse over the last 6 months. Difficult to determine if this is natural progression of his muscular dystrophy versus whether or not it is related to peripheral arterial disease. He has reasonable perfusion and is not currently at risk of limb loss. 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 However reconstruction of these may not have significant impact on strengthening his lower extremities. Currently I  believe the risk of an intervention at this point outweighs any potential benefit.   PLAN:  I spoke with the patient at length today as well as his family members. If he develops rest pain in his foot or tissue loss we would consider an intervention at that point. Otherwise he will follow-up in 6 months time for repeat ABIs.  Fabienne Brunsharles Kemisha Bonnette, MD [facility] Office: [phone] Pager: [phone]

## 2016-09-02 ENCOUNTER — Other Ambulatory Visit: Payer: Self-pay | Admitting: Obstetrics and Gynecology

## 2016-09-02 DIAGNOSIS — M79604 Pain in right leg: Secondary | ICD-10-CM

## 2016-09-06 NOTE — Telephone Encounter (Signed)
Will be in clinic today to fill script

## 2016-09-14 NOTE — Addendum Note (Signed)
Addended by: Melodye PedMANESS-HARRISON, CHANDA C on: 09/14/2016 03:15 PM   Modules accepted: Orders

## 2016-11-23 ENCOUNTER — Encounter (HOSPITAL_COMMUNITY): Payer: Self-pay | Admitting: Emergency Medicine

## 2016-11-23 ENCOUNTER — Emergency Department (HOSPITAL_COMMUNITY): Payer: Medicaid Other

## 2016-11-23 ENCOUNTER — Emergency Department (HOSPITAL_COMMUNITY)
Admission: EM | Admit: 2016-11-23 | Discharge: 2016-11-23 | Disposition: A | Discharge status: Home / Self Care | Payer: Medicaid Other | Attending: Emergency Medicine | Admitting: Emergency Medicine

## 2016-11-23 DIAGNOSIS — I10 Essential (primary) hypertension: Secondary | ICD-10-CM | POA: Insufficient documentation

## 2016-11-23 DIAGNOSIS — Z7982 Long term (current) use of aspirin: Secondary | ICD-10-CM | POA: Diagnosis not present

## 2016-11-23 DIAGNOSIS — F17210 Nicotine dependence, cigarettes, uncomplicated: Secondary | ICD-10-CM | POA: Insufficient documentation

## 2016-11-23 DIAGNOSIS — R6889 Other general symptoms and signs: Secondary | ICD-10-CM

## 2016-11-23 DIAGNOSIS — R067 Sneezing: Secondary | ICD-10-CM | POA: Insufficient documentation

## 2016-11-23 DIAGNOSIS — R05 Cough: Secondary | ICD-10-CM | POA: Diagnosis not present

## 2016-11-23 DIAGNOSIS — R0981 Nasal congestion: Secondary | ICD-10-CM | POA: Insufficient documentation

## 2016-11-23 LAB — COMPREHENSIVE METABOLIC PANEL
ALT: 110 U/L — ABNORMAL HIGH (ref 17–63)
AST: 99 U/L — ABNORMAL HIGH (ref 15–41)
Albumin: 3.8 g/dL (ref 3.5–5.0)
Alkaline Phosphatase: 123 U/L (ref 38–126)
Anion gap: 10 (ref 5–15)
BUN: 12 mg/dL (ref 6–20)
CO2: 27 mmol/L (ref 22–32)
Calcium: 9.4 mg/dL (ref 8.9–10.3)
Chloride: 105 mmol/L (ref 101–111)
Creatinine, Ser: 0.49 mg/dL — ABNORMAL LOW (ref 0.61–1.24)
GFR calc Af Amer: >60 mL/min (ref >60)
GFR calc non Af Amer: >60 mL/min (ref >60)
Glucose, Bld: 102 mg/dL — ABNORMAL HIGH (ref 65–99)
Potassium: 3.4 mmol/L — ABNORMAL LOW (ref 3.5–5.1)
Sodium: 142 mmol/L (ref 135–145)
Total Bilirubin: 1.4 mg/dL — ABNORMAL HIGH (ref 0.3–1.2)
Total Protein: 7.5 g/dL (ref 6.5–8.1)

## 2016-11-23 LAB — URINALYSIS, ROUTINE W REFLEX MICROSCOPIC
Bilirubin Urine: NEGATIVE
Glucose, UA: NEGATIVE mg/dL
Hgb urine dipstick: NEGATIVE
Ketones, ur: NEGATIVE mg/dL
Leukocytes, UA: NEGATIVE
Nitrite: NEGATIVE
Protein, ur: NEGATIVE mg/dL
Specific Gravity, Urine: 1.019 (ref 1.005–1.030)
pH: 5 (ref 5.0–8.0)

## 2016-11-23 LAB — CBC WITH DIFFERENTIAL/PLATELET
Basophils Absolute: 0 10*3/uL (ref 0.0–0.1)
Basophils Relative: 0 %
Eosinophils Absolute: 0 10*3/uL (ref 0.0–0.7)
Eosinophils Relative: 0 %
HCT: 42.6 % (ref 39.0–52.0)
Hemoglobin: 14.7 g/dL (ref 13.0–17.0)
Lymphocytes Relative: 25 %
Lymphs Abs: 1.6 10*3/uL (ref 0.7–4.0)
MCH: 32.6 pg (ref 26.0–34.0)
MCHC: 34.5 g/dL (ref 30.0–36.0)
MCV: 94.5 fL (ref 78.0–100.0)
Monocytes Absolute: 0.4 10*3/uL (ref 0.1–1.0)
Monocytes Relative: 6 %
Neutro Abs: 4.6 10*3/uL (ref 1.7–7.7)
Neutrophils Relative %: 69 %
Platelets: 239 10*3/uL (ref 150–400)
RBC: 4.51 MIL/uL (ref 4.22–5.81)
RDW: 17.1 % — ABNORMAL HIGH (ref 11.5–15.5)
WBC: 6.6 10*3/uL (ref 4.0–10.5)

## 2016-11-23 MED ORDER — GUAIFENESIN-DM 100-10 MG/5ML PO SYRP: 5.0000 mL | ORAL_SOLUTION | ORAL | 0 refills | Status: DC | PRN

## 2016-11-23 MED ORDER — SALINE SPRAY 0.65 % NA SOLN: 1.0000 | NASAL | 0 refills | Status: DC | PRN

## 2016-11-23 MED ORDER — SODIUM CHLORIDE 0.9 % IV BOLUS (SEPSIS)
1000.0000 mL | Freq: Once | INTRAVENOUS | Status: AC
  Administered 2016-11-23: 1000 mL via INTRAVENOUS

## 2016-11-23 NOTE — ED Triage Notes (Signed)
Pt stated that he has productive coughing, sneezing and c/o chills x 1 week. Did  Not treat withy OTC meds. Denies fever. Lungs clear bilateral, anterior and posterior. Cough is moist, not productive at present. Pt is alert and oriented

## 2016-11-23 NOTE — ED Triage Notes (Signed)
Per EMS- Nurse [facility] called at the request of the patient. Pt c/o 1 week hx of cough, sneezing. Pt denies fever. Pt has hx of MD, and chronic weakness. Pt is alert, oriented and appropriate. Pt can stand with assist, not ambulatory.

## 2016-11-23 NOTE — ED Notes (Signed)
Bed: [facility] Expected date:  Expected time:  Means of arrival:  Comments: 

## 2016-11-23 NOTE — Discharge Instructions (Signed)
Lab work, chest xray, urine analysis normal today. Increase oral hydration. Robitussin for cough. Intranasal saline for congestion. Follow up with family doctor in 2-3 days if not improving.

## 2016-11-23 NOTE — ED Provider Notes (Signed)
[facility]EMERGENCY DEPT Provider Note   CSN: [id number] Arrival date & time: 11/23/16  1123     History   Chief Complaint Chief Complaint  Patient presents with  . Influenza    HPI Malik [name] is a 51 y.o. male.  HPI Malik [name] is a 51 y.o. male with hx of myotonic muscular dystrophy, htn, Presents to emergency department complaining of flulike symptoms for a week. Patient is coming from nursing home facility due to his dystrophy. He is nonambulatory. He states that over last week he has had congestion, sneezing, sore throat, cough, body aches, decreased appetite. He denies any nausea or vomiting. No diarrhea. No dysuria or urinary frequency or urgency. He reports diffuse body aches. He states he has not taken anything for his symptoms prior to coming in. He states that there are several residence at his facility is over the same. He states nothing is making his symptoms better or worse.  Past Medical History:  Diagnosis Date  . Hiccups   . High blood pressure   . Hypertension   . 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 PAD (peripheral artery disease) (HCC) 06/07/2016  . Leg pain, bilateral 06/02/2016  . Esophageal obstruction due to food impaction   . DVT of upper extremity (deep vein thrombosis) (HCC) 01/29/2016  . Residence in long term care facility 03/04/2014  . EKG abnormality 11/09/2013  . Health care maintenance 06/22/2013  . Epidermal cyst 06/22/2013  . Skin lesion of hand 06/22/2013  . 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 Hypertension, benign 01/20/2012  . Unspecified vitamin D deficiency 08/05/2011  . Vitamin B 12 deficiency 07/14/2011  . 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 Muscle weakness (generalized) 04/16/2011  . Weight finding 04/16/2011  . Tobacco user 04/16/2011    Past Surgical History:  Procedure Laterality Date  . CHOLECYSTECTOMY    . ESOPHAGOGASTRODUODENOSCOPY N/A 05/10/2016   Procedure: ESOPHAGOGASTRODUODENOSCOPY (EGD);  Surgeon: [name], MD;  Location: [facility];  Service: Endoscopy;  Laterality: N/A;  Please discuss sedation possibilities with Dr. [name]  . MULTIPLE TOOTH EXTRACTIONS  2007   Pt had all teeth pulled, has not yet gotten dentures.        Home Medications    Prior to Admission medications   Medication Sig Start Date End Date Taking? Authorizing Provider  atorvastatin (LIPITOR) 40 MG tablet Take 1 tablet (40 mg total) by mouth daily. 08/11/16  Yes Pincus Large, DO  diphenhydrAMINE (ALLERGY RELIEF) 25 MG tablet Take 1 tablet (25 mg total) by mouth 3 (three) times daily as needed (itching). 08/11/16  Yes Pincus Large, DO  fenofibrate (TRICOR) 145 MG tablet Take 1 tablet (145 mg total) by mouth daily. 08/11/16  Yes Pincus Large, DO  hydrochlorothiazide (MICROZIDE) 12.5 MG capsule TAKE (1) CAPSULE BY MOUTH ONCE DAILY. Patient taking differently: Take 12.5 mg by mouth daily.  08/11/16  Yes Pincus Large, DO  metoCLOPramide (REGLAN) 10 MG tablet TAKE 1 TABLET BY MOUTH EVERY 8 HOURS AS NEEDED FOR HICCUPS/NAUSEA. Patient taking differently: Take 10 mg by mouth every 8 (eight) hours as needed for nausea or vomiting.  08/11/16  Yes Pincus Large, DO  mirtazapine (REMERON) 15 MG tablet TAKE (1) TABLET BY MOUTH AT BEDTIME. Patient taking differently: Take 15 mg by mouth at bedtime.  08/11/16  Yes Pincus Large, DO  pantoprazole (PROTONIX) 40 MG tablet Take 1 tablet (40 mg total)  by mouth daily. 08/11/16  Yes Pincus Large, DO  traMADol-acetaminophen (ULTRACET) 37.5-325 MG tablet TAKE ONE TABLET BY MOUTH EVERY 8 HOURS AS NEEDED MODERATE PAIN 09/06/16  Yes Pincus Large, DO  aspirin 81 MG tablet Take 1 tablet (81 mg total) by mouth daily. Patient not taking: Reported on 11/23/2016 08/11/16   Pincus Large, DO    Family History Family History  Problem Relation Age of Onset  . COPD Mother   . Diabetes Mother     Social History Social  History  Substance Use Topics  . Smoking status: Current Some Day Smoker    Packs/day: 0.30    Types: Cigarettes    Start date: 11/02/1983  . Smokeless tobacco: Never Used     Comment: 4-5 cigs per day  . Alcohol use No     Allergies   Patient has no known allergies.   Review of Systems Review of Systems  Constitutional: Positive for chills. Negative for fever.  HENT: Positive for congestion and sore throat.   Respiratory: Positive for cough, chest tightness and shortness of breath.   Cardiovascular: Negative for chest pain, palpitations and leg swelling.  Gastrointestinal: Negative for abdominal distention, abdominal pain, diarrhea, nausea and vomiting.  Genitourinary: Negative for dysuria, frequency, hematuria and urgency.  Musculoskeletal: Positive for myalgias. Negative for arthralgias, neck pain and neck stiffness.  Skin: Negative for rash.  Allergic/Immunologic: Negative for immunocompromised state.  Neurological: Positive for headaches. Negative for dizziness, weakness, light-headedness and numbness.  All other systems reviewed and are negative.    Physical Exam Updated Vital Signs BP 152/86 (BP Location: Left Arm)   Pulse 61   Temp 98.5 F (36.9 C)   Resp 18   Ht 5\' 8"  (1.727 m)   Wt 53.5 kg   SpO2 95%   BMI 17.94 kg/m   Physical Exam  Constitutional: He is oriented to person, place, and time. He appears well-developed and well-nourished. No distress.  HENT:  Head: Normocephalic and atraumatic.  Right Ear: External ear normal.  Left Ear: External ear normal.  Mouth/Throat: Oropharynx is clear and moist.  Clear rhinorrhea, pharynx erythemous, uvula midline  Eyes: Conjunctivae are normal.  Neck: Normal range of motion. Neck supple.  No meningeal signs  Cardiovascular: Normal rate, regular rhythm and normal heart sounds.   Pulmonary/Chest: Effort normal and breath sounds normal. No respiratory distress. He has no wheezes. He has no rales.  Abdominal: Soft.  Bowel sounds are normal. There is no tenderness.  Musculoskeletal: He exhibits no edema or tenderness.  Lymphadenopathy:    He has no cervical adenopathy.  Neurological: He is alert and oriented to person, place, and time.  Skin: Skin is warm and dry. No erythema.  Psychiatric: He has a normal mood and affect.  Nursing note and vitals reviewed.    ED Treatments / Results  Labs (all labs ordered are listed, but only abnormal results are displayed) Labs Reviewed  CBC WITH DIFFERENTIAL/PLATELET - Abnormal; Notable for the following:       Result Value   RDW 17.1 (*)    All other components within normal limits  COMPREHENSIVE METABOLIC PANEL - Abnormal; Notable for the following:    Potassium 3.4 (*)    Glucose, Bld 102 (*)    Creatinine, Ser 0.49 (*)    AST 99 (*)    ALT 110 (*)    Total Bilirubin 1.4 (*)    All other components within normal limits  URINALYSIS, ROUTINE W REFLEX MICROSCOPIC -  Abnormal; Notable for the following:    Color, Urine AMBER (*)    APPearance HAZY (*)    All other components within normal limits    EKG  EKG Interpretation  Date/Time:  Tuesday November 23 2016 16:04:16 EST Ventricular Rate:  53 PR Interval:    QRS Duration: 85 QT Interval:  432 QTC Calculation: 406 R Axis:   11 Text Interpretation:  Sinus rhythm Left ventricular hypertrophy ST elev, probable normal early repol pattern Baseline wander in lead(s) V1 No significant change since last tracing Confirmed by KNOTT MD, DANIEL [phone]) on 11/23/2016 4:10:49 PM       Radiology Dg Chest 2 View  Result Date: 11/23/2016 CLINICAL DATA:  Cough and weakness EXAM: CHEST  2 VIEW COMPARISON:  Chest radiographs January 29, 2016 and May 10, 2016 FINDINGS: There are areas of scarring in the left mid lower lung zones, stable. There is also scarring in the right base region. There is no edema or consolidation. Heart size and pulmonary vascularity are normal. No adenopathy. There is degenerative change in  the thoracic spine. There is calcification in lower thoracic discs. IMPRESSION: Areas of lung scarring bilaterally.  No edema or consolidation. Electronically Signed   By: Bretta Bang III M.D.   On: 11/23/2016 15:47    Procedures Procedures (including critical care time)  Medications Ordered in ED Medications  sodium chloride 0.9 % bolus 1,000 mL (not administered)     Initial Impression / Assessment and Plan / ED Course  I have reviewed the triage vital signs and the nursing notes.  Pertinent labs & imaging results that were available during my care of the patient were reviewed by me and considered in my medical decision making (see chart for details).     Patient in emergency department with flulike symptoms. Vital signs are normal, he is afebrile, respiratory distress, respiratory rate is 18, blood pressure is normal, oxygen saturation is 98% on room air. Lungs are clear. Exam is unremarkable. We will get labs and chest x-ray, will order a bolus of IV fluids. Will reassess.  5:33 PM Patient's blood work including white blood cell count, renal function, hemoglobin, electrolytes all unremarkable For slightly low potassium 3.4. His LFTs are elevated, but appear to be chronically elevated. His chest x-ray is negative for pneumonia. He is afebrile, normal vital signs. Patient stable for discharge home back to the facility at this time. Will add Robitussin for his cough. Will advise to give intranasal saline for congestion. Follow-up with primary care doctor in 2-3 days if not improving. Symptoms most consistent with viral URI vs influenza with no apparent complications.   Vitals:   11/23/16 1127 11/23/16 1142 11/23/16 1504 11/23/16 1604  BP: 120/80  152/86 134/89  Pulse: 70  61 (!) 54  Resp: 18  18 22   Temp: 97.9 F (36.6 C)  98.5 F (36.9 C)   TempSrc: Oral     SpO2: 96%  95% 93%  Weight:  53.5 kg 53.5 kg   Height:   5\' 8"  (1.727 m)      Final Clinical Impressions(s) / ED  Diagnoses   Final diagnoses:  Flu-like symptoms    New Prescriptions New Prescriptions   GUAIFENESIN-DEXTROMETHORPHAN (ROBITUSSIN DM) 100-10 MG/5ML SYRUP    Take 5 mLs by mouth every 4 (four) hours as needed for cough.   SODIUM CHLORIDE (OCEAN) 0.65 % SOLN NASAL SPRAY    Place 1 spray into both nostrils as needed for congestion.  Jaynie Crumbleatyana Grecia Lynk, PA-C 11/23/16 1737    Lyndal Pulleyaniel Knott, MD 11/23/16 2259

## 2016-11-23 NOTE — ED Notes (Signed)
[facility] called for transport.  

## 2016-12-31 ENCOUNTER — Encounter: Payer: Self-pay | Admitting: Vascular Surgery

## 2017-01-12 ENCOUNTER — Ambulatory Visit (INDEPENDENT_AMBULATORY_CARE_PROVIDER_SITE_OTHER): Payer: Medicaid Other | Admitting: Vascular Surgery

## 2017-01-12 ENCOUNTER — Ambulatory Visit (HOSPITAL_COMMUNITY)
Admission: RE | Admit: 2017-01-12 | Discharge: 2017-01-12 | Disposition: A | Discharge status: Home / Self Care | Payer: Medicaid Other | Source: Ambulatory Visit | Attending: Vascular Surgery | Admitting: Vascular Surgery

## 2017-01-12 ENCOUNTER — Encounter: Payer: Self-pay | Admitting: Vascular Surgery

## 2017-01-12 VITALS — BP 116/87 | HR 79 | Temp 97.2°F | Resp 14 | Ht 67.0 in | Wt 118.0 lb

## 2017-01-12 DIAGNOSIS — M79605 Pain in left leg: Secondary | ICD-10-CM | POA: Diagnosis present

## 2017-01-12 DIAGNOSIS — I739 Peripheral vascular disease, unspecified: Secondary | ICD-10-CM | POA: Diagnosis not present

## 2017-01-12 NOTE — Progress Notes (Signed)
Referring Physician: Dr Doroteo Glassman   Patient name: Malik [name]            MRN: [id number]        DOB: Jun 21, 1966          Sex: male   REASON FOR CONSULT: Lower extremity weakness possible peripheral arterial disease   HPI: Jaicob [name] is a 51 y.o. 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  He was previously seen about 5 months ago and noted to have ABIs of 0.6 bilaterally. His symptoms did not seem to be consistent with peripheral arterial disease. This seemed more related to his muscular dystrophy. He is now no longer able to even stand to transfer. Today he complains of pain in his left anterior thigh which has been present for several weeks. He has been taking tramadol for the pain. He currently smokes about a half a pack of cigarettes per day.    He denies history of diabetes. He did previously have a upper extremity DVT and was on Xarelto but currently is not requiring this. He does not ambulate enough to elicit claudication symptoms. He denies rest pain. He has no tissue loss. Other medical problems include hypertension which has been controlled.        Past Medical History:  Diagnosis Date  . Hiccups    . High blood pressure    . Hypertension    . 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 CHOLECYSTECTOMY      . ESOPHAGOGASTRODUODENOSCOPY N/A 05/10/2016    Procedure: ESOPHAGOGASTRODUODENOSCOPY (EGD);  Surgeon: [name], MD;  Location: [facility];  Service: Endoscopy;  Laterality: N/A;  Please discuss sedation possibilities with Dr. [name]  . MULTIPLE TOOTH EXTRACTIONS   2007    Pt had all teeth pulled, has not yet gotten dentures.            Family History  Problem Relation Age of Onset  . COPD Mother    . Diabetes Mother        SOCIAL HISTORY: Social History    Social History  . Marital status: Single      Spouse name: N/A  . Number of children: 0    . Years of education: GED         Occupational History  .          Disabled          Social History Main Topics  . Smoking status: Current Some Day Smoker      Packs/day: 0.30      Types: Cigarettes      Start date: 11/02/1983  . Smokeless tobacco: Never Used        Comment: 4-5 cigs per day  . Alcohol use No  . Drug use: No        Comment: Pt smokes MJ ocassinally.  Denies ever using IV drugs.   . Sexual activity: Not Currently      Birth control/ protection: Condom        Comment: Pt states he always uses condomns when sexually active.         Other Topics Concern  . Not on file       Social History Narrative    Patient has stated to me that he wishes to be DNR/DNI.  We have discussed the slow progression  that we anticipate with his myotonic muscular dystrophy, and he does not want interventions to artificially prolong his life such as trach/vent or feeding tube.  His father who is next of kin was present for this discussion and agrees, understand's Eastyn's wishes.  A traveling DNR was filled out 03/09/2013, and patient given information about living will and HCPOA.          CHAMBERLAIN,RACHEL, MD    03/09/2013              Patient lives in assisted living [facility].    Disabled.    Education GED.    Right handed.    Caffeine one cup of  Coffee daily.                      No Known Allergies    Current Outpatient Prescriptions on File Prior to Visit  Medication Sig Dispense Refill  . aspirin 81 MG tablet Take 1 tablet (81 mg total) by mouth daily. 30 tablet 11  . atorvastatin (LIPITOR) 40 MG tablet Take 1 tablet (40 mg total) by mouth daily. 30 tablet 11  . fenofibrate (TRICOR) 145 MG tablet Take 1 tablet (145 mg total) by mouth daily. 30 tablet 11  . hydrochlorothiazide (MICROZIDE) 12.5 MG capsule TAKE (1) CAPSULE BY MOUTH ONCE DAILY. (Patient taking differently: Take 12.5 mg by mouth daily. ) 30 capsule 11  . metoCLOPramide (REGLAN) 10 MG tablet  TAKE 1 TABLET BY MOUTH EVERY 8 HOURS AS NEEDED FOR HICCUPS/NAUSEA. (Patient taking differently: Take 10 mg by mouth every 8 (eight) hours as needed for nausea or vomiting. ) 30 tablet 0  . mirtazapine (REMERON) 15 MG tablet TAKE (1) TABLET BY MOUTH AT BEDTIME. (Patient taking differently: Take 15 mg by mouth at bedtime. ) 30 tablet 6  . pantoprazole (PROTONIX) 40 MG tablet Take 1 tablet (40 mg total) by mouth daily. 30 tablet 6  . sodium chloride (OCEAN) 0.65 % SOLN nasal spray Place 1 spray into both nostrils as needed for congestion. 1 Bottle 0  . traMADol-acetaminophen (ULTRACET) 37.5-325 MG tablet TAKE ONE TABLET BY MOUTH EVERY 8 HOURS AS NEEDED MODERATE PAIN 180 tablet 1  . diphenhydrAMINE (ALLERGY RELIEF) 25 MG tablet Take 1 tablet (25 mg total) by mouth 3 (three) times daily as needed (itching). (Patient not taking: Reported on 01/12/2017) 30 tablet 4  . guaiFENesin-dextromethorphan (ROBITUSSIN DM) 100-10 MG/5ML syrup Take 5 mLs by mouth every 4 (four) hours as needed for cough. (Patient not taking: Reported on 01/12/2017) 118 mL 0   No current facility-administered medications on file prior to visit.      ROS:    General:  No weight loss, Fever, chills    Vascular: No history of rest pain in feet.  No history of claudication.  No history of non-healing ulcer, No history of DVT    Pulmonary: No home oxygen, no productive cough, no hemoptysis,  No asthma or wheezing      Physical Examination    Vitals:   01/12/17 1534  BP: 116/87  Pulse: 79  Resp: 14  Temp: 97.2 F (36.2 C)  SpO2: 95%  Weight: 118 lb (53.5 kg)  Height: 5\' 7"  (1.702 m)     General:  Alert and oriented, no acute distress Skin: No rash, no ulcer Extremity Pulses:  2+ radial, brachial,Difficult to palpate femoral attention leg due to patient positioning in a wheelchair, absent dorsalis pedis, posterior tibial pulses bilaterally Musculoskeletal: No deformity or  edema, thin atrophied muscles     Neurologic: Upper  and lower extremity motor 5/5 and symmetric   DATA:  The patient had bilateral ABIs performed on 06/25/2016. ABIs were 0.6 bilaterally.  The patient had repeat ABIs today which were 0.39 bilaterally   ASSESSMENT:  Patient with bilateral lower extremity weakness which has been progressively worse over the last 6 months. Difficult to determine if this is natural progression of his muscular dystrophy versus whether or not it is related to peripheral arterial disease. His ABIs have significantly declined since I saw him 4 months ago. 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 I do not believe his current left anterior thigh pain would be related to peripheral arterial disease as this would be highly unusual symptom but his ABIs are low enough that he is at risk for limb loss. He is not a candidate for any open operation. However if it looks like on CT scan there may be something simple we can do to improve his perfusion I would consider that.   PLAN:   we will obtain a CT angiogram with runoff and he will see me back in follow-up after that.  Fabienne Brunsharles Fields, MD [facility] [place] Office: [phone] Pager: [phone]

## 2017-01-13 ENCOUNTER — Ambulatory Visit: Payer: Medicaid Other | Admitting: Vascular Surgery

## 2017-01-13 NOTE — Addendum Note (Signed)
Addended by: Burton ApleyPETTY, Navaya Wiatrek A on: 01/13/2017 11:50 AM   Modules accepted: Orders

## 2017-01-19 ENCOUNTER — Other Ambulatory Visit: Payer: Medicaid Other

## 2017-01-20 ENCOUNTER — Ambulatory Visit: Payer: Medicaid Other | Admitting: Vascular Surgery

## 2017-01-27 ENCOUNTER — Ambulatory Visit
Admission: RE | Admit: 2017-01-27 | Discharge: 2017-01-27 | Disposition: A | Discharge status: Home / Self Care | Payer: Medicaid Other | Source: Ambulatory Visit | Attending: Vascular Surgery | Admitting: Vascular Surgery

## 2017-01-27 DIAGNOSIS — I739 Peripheral vascular disease, unspecified: Secondary | ICD-10-CM

## 2017-01-27 DIAGNOSIS — M79605 Pain in left leg: Secondary | ICD-10-CM

## 2017-01-27 MED ORDER — IOPAMIDOL (ISOVUE-370) INJECTION 76%
100.0000 mL | Freq: Once | INTRAVENOUS | Status: AC | PRN
  Administered 2017-01-27: 100 mL via INTRAVENOUS

## 2017-02-07 ENCOUNTER — Encounter: Payer: Self-pay | Admitting: Vascular Surgery

## 2017-02-16 ENCOUNTER — Ambulatory Visit (INDEPENDENT_AMBULATORY_CARE_PROVIDER_SITE_OTHER): Payer: Medicaid Other | Admitting: Vascular Surgery

## 2017-02-16 ENCOUNTER — Encounter: Payer: Self-pay | Admitting: Vascular Surgery

## 2017-02-16 VITALS — BP 121/85 | HR 91 | Temp 97.3°F | Resp 16 | Ht 68.0 in | Wt 118.0 lb

## 2017-02-16 DIAGNOSIS — I739 Peripheral vascular disease, unspecified: Secondary | ICD-10-CM | POA: Diagnosis not present

## 2017-02-16 NOTE — Progress Notes (Signed)
Referring Physician: Dr Doroteo Glassman   Patient name: Malik [name]            MRN: [id number]        DOB: 1965-11-24          Sex: male   REASON FOR CONSULT: Lower extremity weakness and peripheral arterial disease   HPI: Malik [name] is a 51 y.o. 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  He was previously seen about 5 months ago and noted to have ABIs of 0.6 bilaterally. His symptoms did not seem to be consistent with peripheral arterial disease. This seemed more related to his muscular dystrophy. He is now no longer able to even stand to transfer. He currently smokes about a half a pack of cigarettes per day. He denies any rest pain in his feet. He has no nonhealing wounds. He returns today after recent CT angiogram for further evaluation of his lower extremity arterial tree. He is nonambulatory but does use his legs for transferring.    He denies history of diabetes. He did previously have a upper extremity DVT and was on Xarelto but currently is not requiring this. He does not ambulate enough to elicit claudication symptoms. He denies rest pain. He has no tissue loss. Other medical problems include hypertension which has been controlled.           Past Medical History:  Diagnosis Date  . Hiccups    . High blood pressure    . Hypertension    . 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 CHOLECYSTECTOMY      . ESOPHAGOGASTRODUODENOSCOPY N/A 05/10/2016    Procedure: ESOPHAGOGASTRODUODENOSCOPY (EGD);  Surgeon: [name], MD;  Location: [facility];  Service: Endoscopy;  Laterality: N/A;  Please discuss sedation possibilities with Dr. [name]  . MULTIPLE TOOTH EXTRACTIONS   2007    Pt had all teeth pulled, has not yet gotten dentures.                Family History  Problem Relation Age of Onset  . COPD Mother    . Diabetes Mother        SOCIAL HISTORY: Social History           Social History  . Marital status: Single      Spouse name: N/A  . Number of children: 0  . Years of education: GED             Occupational History  .          Disabled               Social History Main Topics  . Smoking status: Current Some Day Smoker      Packs/day: 0.30      Types: Cigarettes      Start date: 11/02/1983  . Smokeless tobacco: Never Used        Comment: 4-5 cigs per day  . Alcohol use No  . Drug use: No        Comment: Pt smokes MJ ocassinally.  Denies ever using IV drugs.   . Sexual activity: Not Currently      Birth control/ protection: Condom        Comment: Pt states he always uses condomns when sexually active.  Other Topics Concern  . Not on file         Social History Narrative    Patient has stated to me that he wishes to be DNR/DNI.  We have discussed the slow progression that we anticipate with his myotonic muscular dystrophy, and he does not want interventions to artificially prolong his life such as trach/vent or feeding tube.  His father who is next of kin was present for this discussion and agrees, understand's Malik [name]'s wishes.  A traveling DNR was filled out 03/09/2013, and patient given information about living will and HCPOA.          CHAMBERLAIN,RACHEL, MD    03/09/2013              Patient lives in assisted living [facility].    Disabled.    Education GED.    Right handed.    Caffeine one cup of  Coffee daily.                      No Known Allergies    Current Outpatient Prescriptions on File Prior to Visit  Medication Sig Dispense Refill  . atorvastatin (LIPITOR) 40 MG tablet Take 1 tablet (40 mg total) by mouth daily. 30 tablet 11  . diphenhydrAMINE (ALLERGY RELIEF) 25 MG tablet Take 1 tablet (25 mg total) by mouth 3 (three) times daily as needed (itching). 30 tablet 4  . fenofibrate (TRICOR) 145 MG tablet Take 1 tablet (145 mg total) by mouth daily. 30 tablet 11  . hydrochlorothiazide  (MICROZIDE) 12.5 MG capsule TAKE (1) CAPSULE BY MOUTH ONCE DAILY. (Patient taking differently: Take 12.5 mg by mouth daily. ) 30 capsule 11  . metoCLOPramide (REGLAN) 10 MG tablet TAKE 1 TABLET BY MOUTH EVERY 8 HOURS AS NEEDED FOR HICCUPS/NAUSEA. (Patient taking differently: Take 10 mg by mouth every 8 (eight) hours as needed for nausea or vomiting. ) 30 tablet 0  . mirtazapine (REMERON) 15 MG tablet TAKE (1) TABLET BY MOUTH AT BEDTIME. (Patient taking differently: Take 15 mg by mouth at bedtime. ) 30 tablet 6  . pantoprazole (PROTONIX) 40 MG tablet Take 1 tablet (40 mg total) by mouth daily. 30 tablet 6  . sodium chloride (OCEAN) 0.65 % SOLN nasal spray Place 1 spray into both nostrils as needed for congestion. 1 Bottle 0  . traMADol-acetaminophen (ULTRACET) 37.5-325 MG tablet TAKE ONE TABLET BY MOUTH EVERY 8 HOURS AS NEEDED MODERATE PAIN 180 tablet 1  . aspirin 81 MG tablet Take 1 tablet (81 mg total) by mouth daily. 30 tablet 11  . guaiFENesin-dextromethorphan (ROBITUSSIN DM) 100-10 MG/5ML syrup Take 5 mLs by mouth every 4 (four) hours as needed for cough. (Patient not taking: Reported on 01/12/2017) 118 mL 0   No current facility-administered medications on file prior to visit.      ROS:    General:  No weight loss, Fever, chills   Vascular: No history of rest pain in feet.  No history of claudication.  No history of non-healing ulcer, No history of DVT    Pulmonary: No home oxygen, no productive cough, no hemoptysis,  No asthma or wheezing     Physical Examination    Vitals:   02/16/17 1414  BP: 121/85  Pulse: 91  Resp: 16  Temp: 97.3 F (36.3 C)  SpO2: 96%  Weight: 118 lb (53.5 kg)  Height:  (1.727 m)     General:  Alert and oriented, no acute distress Skin: No  rash, no ulcer Extremity Pulses:  2+ radial, brachial Right side, left side one plus brachial absent radial  absent dorsalis pedis, posterior tibial pulses bilaterally Musculoskeletal: No deformity or edema,  thin atrophied muscles     Neurologic: Upper and lower extremity motor 5/5 and symmetric   DATA:  The patient had bilateral ABIs performed on 06/25/2016. ABIs were 0.6 bilaterally.   The patient had repeat ABIs March 2018 which were 0.39 bilaterally  I reviewed the images and the patient's CT angiogram today. 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 The right popliteal artery is occluded. There is one-vessel runoff via the posterior tibial artery.  In the left leg the left common iliac artery is occluded. The left superficial femoral artery is occluded. There is two-vessel runoff via the anterior and posterior tibial artery.   ASSESSMENT:  Patient with bilateral lower extremity weakness which has been progressively worse over the last 6 months. Difficult to determine if this is natural progression of his muscular dystrophy versus whether or not it is related to peripheral arterial disease. However, I suspect most of his weakness symptoms are secondary to his muscular dystrophy. His ABIs have significantly declined since I saw him 4 months ago. He has multi level arterial occlusive level disease. I do not believe his left anterior thigh pain would be related to peripheral arterial disease as this would be highly unusual symptom but his ABIs are low enough that he is at risk for limb loss. Anatomically the most durable procedure for this patient would be an aortobifemoral bypass however I do not believe he is a candidate for that due to his underlying lung muscular dystrophy and would be at high risk of ventilator and respiratory issues. Unfortunately do not believe he is going to have a percutaneous option for revascularization side believe his best option at this point would be axillary bifemoral bypass. I discussed the patient today the options of watchful waiting with the possibility that he could potentially lose his legs. Other option would be to place an axillary Bifemoral  bypass graft with the complications of respiratory cardiac possible graft thrombosis possible eventual limb loss explained to the patient. He has opted for the axbifem bypass at this point.    PLAN:    Right axillary bifemoral bypass after cardiac risk stratification. If he is deemed high risk from a cardiac standpoint we could reconsider whether or not he might be a candidate for a complex endovascular procedure for improvement of overall blood flow.   Fabienne Bruns, MD [facility] [place] Office: [phone] Pager: [phone]

## 2017-02-22 ENCOUNTER — Ambulatory Visit (INDEPENDENT_AMBULATORY_CARE_PROVIDER_SITE_OTHER): Payer: Medicaid Other | Admitting: Cardiovascular Disease

## 2017-02-22 ENCOUNTER — Encounter: Payer: Self-pay | Admitting: Cardiovascular Disease

## 2017-02-22 VITALS — BP 115/81 | HR 79 | Ht 67.0 in | Wt 118.0 lb

## 2017-02-22 DIAGNOSIS — I10 Essential (primary) hypertension: Secondary | ICD-10-CM

## 2017-02-22 DIAGNOSIS — E7800 Pure hypercholesterolemia, unspecified: Secondary | ICD-10-CM

## 2017-02-22 DIAGNOSIS — Z720 Tobacco use: Secondary | ICD-10-CM | POA: Diagnosis not present

## 2017-02-22 DIAGNOSIS — Z01818 Encounter for other preprocedural examination: Secondary | ICD-10-CM

## 2017-02-22 DIAGNOSIS — I739 Peripheral vascular disease, unspecified: Secondary | ICD-10-CM | POA: Diagnosis not present

## 2017-02-22 NOTE — Patient Instructions (Signed)
Medication Instructions:  START ASPIRIN 81 MG DAILY   Labwork: FASTING LP/CMET SOON IF THIS HAS NOT BEEN DONE WITHIN THE LAST 3 MONTHS   Testing/Procedures: Your physician has requested that you have a lexiscan myoview. For further information please visit [email or web address] Please follow instruction sheet, as given.   Follow-Up: Your physician wants you to follow-up in: 6 MONTH OV You will receive a reminder letter in the mail two months in advance. If you don't receive a letter, please call our office to schedule the follow-up appointment.  If you need a refill on your cardiac medications before your next appointment, please call your pharmacy.

## 2017-02-22 NOTE — Progress Notes (Signed)
Cardiology Office Note   Date:  02/24/2017   ID:  Malik [name], DOB 07-18-1966, MRN [id number]  PCP:  Caryl Ada, DO  Cardiologist:   Chilton Si, MD   Chief Complaint  Patient presents with  . New Patient (Initial Visit)    Pt states no Sx.       History of Present Illness: Malik [name] is a 51 y.o. male with hypertenison, PAD, prior DVT, tobacco abuse and muscular dystrophy, who is being seen today for the evaluation of pre-surgical risk assessment prior to R axillary bifemoral bypass surgery at the request of [name], MD.  Malik [name] report pain in his R thigh for the last 3-4 weeks.  He has lower extremity weakness 2/2 muscular dystrophy and is unable to walk.  He had ABIs 01/12/17 that revealed absent R DP and L PT Doppler signals.  His ABI bilaterally was 0.39, diagnostic of severe arterial occlusive disease.  He was evaluated by Dr. Darrick Penna and plans to undergo R axillary bifemoral bypass surgery.  He was referred to cardiology for pre-surgical risk assessmetn.  Malik [name] complains of feeling tired for the last 7-8 months.  He denies chest pain but does get short of breath when pushing his wheelchair.  He denies lower extremity edema, orthopnea or PND.  He lives in a nursing facility. He smokes 1-2 cigarettes daily.  He was able to quit smoking for 6 months 5-6 years ago.  He thinks that it would be hard for him to quit again because so many people around him smoke.    Past Medical History:  Diagnosis Date  . Hiccups   . High blood pressure   . Hypertension   . 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 CHOLECYSTECTOMY    . ESOPHAGOGASTRODUODENOSCOPY N/A 05/10/2016   Procedure: ESOPHAGOGASTRODUODENOSCOPY (EGD);  Surgeon: [name], MD;  Location: [facility];  Service: Endoscopy;  Laterality: N/A;  Please discuss sedation possibilities with Dr. [name]  . MULTIPLE TOOTH EXTRACTIONS  2007   Pt  had all teeth pulled, has not yet gotten dentures.      Current Outpatient Prescriptions  Medication Sig Dispense Refill  . Ascorbic Acid (VITAMIN C PO) Take by mouth.    Marland Kitchen aspirin EC 81 MG tablet Take 81 mg by mouth daily.    . hydrochlorothiazide (MICROZIDE) 12.5 MG capsule TAKE (1) CAPSULE BY MOUTH ONCE DAILY. (Patient taking differently: Take 12.5 mg by mouth daily. ) 30 capsule 11  . traMADol-acetaminophen (ULTRACET) 37.5-325 MG tablet TAKE ONE TABLET BY MOUTH EVERY 8 HOURS AS NEEDED MODERATE PAIN 180 tablet 1   No current facility-administered medications for this visit.     Allergies:   Patient has no known allergies.    Social History:  The patient  reports that he has been smoking Cigarettes.  He started smoking about 33 years ago. He has been smoking about 0.30 packs per day. He has never used smokeless tobacco. He reports that he does not drink alcohol or use drugs.   Family History:  The patient's family history includes Breast cancer in his mother; COPD in his mother; Cancer in his father and sister; Diabetes in his mother; Muscular dystrophy in his sister.    ROS:  Please see the history of present illness.   Otherwise, review of systems are positive for insomnia.   All other systems are reviewed and negative.    PHYSICAL EXAM:  VS:  BP 115/81   Pulse 79   Ht  (1.702 m)   Wt 53.5 kg (118 lb)   BMI 18.48 kg/m  , BMI Body mass index is 18.48 kg/m. GENERAL:  Well appearing HEENT:  Pupils equal round and reactive, fundi not visualized, oral mucosa unremarkable NECK:  No jugular venous distention, waveform within normal limits, carotid upstroke brisk and symmetric, no bruits, no thyromegaly LYMPHATICS:  No cervical adenopathy LUNGS:  Clear to auscultation bilaterally HEART:  RRR.  PMI not displaced or sustained,S1 and S2 within normal limits, no S3, no S4, no clicks, no rubs, no murmurs ABD:  Flat, positive bowel sounds normal in frequency in pitch, no bruits, no  rebound, no guarding, no midline pulsatile mass, no hepatomegaly, no splenomegaly EXT:  2 plus pulses throughout, no edema, no cyanosis no clubbing SKIN:  No rashes no nodules NEURO:  Cranial nerves II through XII grossly intact, motor grossly intact throughout PSYCH:  Cognitively intact, oriented to person place and time   EKG:  EKG is not ordered today. The ekg ordered 11/25/16 demonstrates sinus bradycardia rate 53 bpm.  LVH with repolarization abnormalities.    Recent Labs: 11/23/2016: ALT 110; BUN 12; Creatinine, Ser 0.49; Hemoglobin 14.7; Platelets 239; Potassium 3.4; Sodium 142    Lipid Panel    Component Value Date/Time   CHOL 217 (H) 07/25/2013 1419   TRIG 303 (H) 07/25/2013 1419   HDL 52 07/25/2013 1419   CHOLHDL 4.2 07/25/2013 1419   VLDL 61 (H) 07/25/2013 1419   LDLCALC 104 (H) 07/25/2013 1419      Wt Readings from Last 3 Encounters:  02/22/17 53.5 kg (118 lb)  02/16/17 53.5 kg (118 lb)  01/12/17 53.5 kg (118 lb)      ASSESSMENT AND PLAN:  # Pre-surgical risk: Malik [name] is unable to walk and doesn't get much exercise.  Therefore the only way to assess his risk of ischemia is through stress testing.  We will get a [employer].    # PAD: Malik [name] has extensive disease.  We will start him on aspirin 81 mg daily.  He reports that labs were recently drawn at his facility.  If lipids and a CMP were not drawn we will obtain these records.  He will likely need a statin.  If this worsens his muscular dystrophy, will try PCSK9 inhibitor.  # Hypertension: Blood pressure controlled on HCTZ,  # Tobacco abuse: Encouraged smoking cessation.  He is not ready to quit at this time.  We discussed smoking cessation for 5 minutes.   Current medicines are reviewed at length with the patient today.  The patient does not have concerns regarding medicines.  The following changes have been made:  Start aspirin  and statin pending labs  Labs/ tests ordered today include:     Orders Placed This Encounter  Procedures  . Lipid panel  . Comprehensive metabolic panel  . Myocardial Perfusion Imaging     Disposition:   FU with Skyelyn Scruggs C. Duke Salvia, MD, [facility] in 6 months    This note was written with the assistance of speech recognition software.  Please excuse any transcriptional errors.  Signed, Karanvir Balderston C. Duke Salvia, MD, [facility]  02/24/2017 8:28 AM    [facility]

## 2017-02-24 ENCOUNTER — Other Ambulatory Visit: Payer: Self-pay

## 2017-02-24 ENCOUNTER — Telehealth (HOSPITAL_COMMUNITY): Payer: Self-pay

## 2017-02-24 NOTE — Telephone Encounter (Signed)
Encounter complete. 

## 2017-02-25 ENCOUNTER — Telehealth (HOSPITAL_COMMUNITY): Payer: Self-pay

## 2017-02-25 NOTE — Telephone Encounter (Signed)
Encounter complete. 

## 2017-03-01 ENCOUNTER — Encounter: Payer: Self-pay | Admitting: Vascular Surgery

## 2017-03-01 ENCOUNTER — Ambulatory Visit (HOSPITAL_COMMUNITY)
Admission: RE | Admit: 2017-03-01 | Discharge: 2017-03-01 | Disposition: A | Discharge status: Home / Self Care | Payer: Medicaid Other | Source: Ambulatory Visit | Attending: Cardiovascular Disease | Admitting: Cardiovascular Disease

## 2017-03-01 DIAGNOSIS — G7111 Myotonic muscular dystrophy: Secondary | ICD-10-CM | POA: Diagnosis not present

## 2017-03-01 DIAGNOSIS — F17200 Nicotine dependence, unspecified, uncomplicated: Secondary | ICD-10-CM | POA: Diagnosis not present

## 2017-03-01 DIAGNOSIS — Z86718 Personal history of other venous thrombosis and embolism: Secondary | ICD-10-CM | POA: Insufficient documentation

## 2017-03-01 DIAGNOSIS — I10 Essential (primary) hypertension: Secondary | ICD-10-CM | POA: Insufficient documentation

## 2017-03-01 DIAGNOSIS — Z01818 Encounter for other preprocedural examination: Secondary | ICD-10-CM | POA: Diagnosis not present

## 2017-03-01 LAB — MYOCARDIAL PERFUSION IMAGING
LV dias vol: 54 mL (ref 62–150)
LV sys vol: 13 mL
Peak HR: 93 {beats}/min
Rest HR: 57 {beats}/min
SDS: 3
SRS: 5
SSS: 8
TID: 0.72

## 2017-03-01 MED ORDER — TECHNETIUM TC 99M TETROFOSMIN IV KIT
9.4000 | PACK | Freq: Once | INTRAVENOUS | Status: AC | PRN
  Administered 2017-03-01: 9.4 via INTRAVENOUS
  Filled 2017-03-01: qty 10

## 2017-03-01 MED ORDER — TECHNETIUM TC 99M TETROFOSMIN IV KIT
30.2000 | PACK | Freq: Once | INTRAVENOUS | Status: AC | PRN
  Administered 2017-03-01: 30.2 via INTRAVENOUS
  Filled 2017-03-01: qty 31

## 2017-03-01 MED ORDER — REGADENOSON 0.4 MG/5ML IV SOLN
0.4000 mg | Freq: Once | INTRAVENOUS | Status: AC
  Administered 2017-03-01: 0.4 mg via INTRAVENOUS

## 2017-03-03 ENCOUNTER — Telehealth (HOSPITAL_COMMUNITY): Payer: Self-pay

## 2017-03-03 NOTE — Telephone Encounter (Signed)
Encounter complete. 

## 2017-03-04 ENCOUNTER — Encounter (HOSPITAL_COMMUNITY): Payer: Self-pay

## 2017-03-04 ENCOUNTER — Encounter (HOSPITAL_COMMUNITY)
Admission: RE | Admit: 2017-03-04 | Discharge: 2017-03-04 | Disposition: A | Discharge status: Home / Self Care | Payer: Medicaid Other | Source: Ambulatory Visit | Attending: Vascular Surgery | Admitting: Vascular Surgery

## 2017-03-04 DIAGNOSIS — Z9049 Acquired absence of other specified parts of digestive tract: Secondary | ICD-10-CM | POA: Insufficient documentation

## 2017-03-04 DIAGNOSIS — Z01812 Encounter for preprocedural laboratory examination: Secondary | ICD-10-CM | POA: Diagnosis not present

## 2017-03-04 DIAGNOSIS — Z79899 Other long term (current) drug therapy: Secondary | ICD-10-CM | POA: Insufficient documentation

## 2017-03-04 DIAGNOSIS — Z9889 Other specified postprocedural states: Secondary | ICD-10-CM | POA: Insufficient documentation

## 2017-03-04 DIAGNOSIS — Z79891 Long term (current) use of opiate analgesic: Secondary | ICD-10-CM | POA: Insufficient documentation

## 2017-03-04 DIAGNOSIS — I10 Essential (primary) hypertension: Secondary | ICD-10-CM | POA: Insufficient documentation

## 2017-03-04 DIAGNOSIS — G7111 Myotonic muscular dystrophy: Secondary | ICD-10-CM | POA: Diagnosis not present

## 2017-03-04 DIAGNOSIS — F17210 Nicotine dependence, cigarettes, uncomplicated: Secondary | ICD-10-CM | POA: Insufficient documentation

## 2017-03-04 DIAGNOSIS — I739 Peripheral vascular disease, unspecified: Secondary | ICD-10-CM | POA: Insufficient documentation

## 2017-03-04 HISTORY — DX: Personal history of urinary calculi: Z87.442

## 2017-03-04 HISTORY — DX: Acute embolism and thrombosis of unspecified deep veins of unspecified lower extremity: I82.409

## 2017-03-04 LAB — TYPE AND SCREEN
ABO/RH(D): A POS
Antibody Screen: NEGATIVE

## 2017-03-04 LAB — CBC
HCT: 45.7 % (ref 39.0–52.0)
Hemoglobin: 14.8 g/dL (ref 13.0–17.0)
MCH: 30.6 pg (ref 26.0–34.0)
MCHC: 32.4 g/dL (ref 30.0–36.0)
MCV: 94.6 fL (ref 78.0–100.0)
Platelets: 220 10*3/uL (ref 150–400)
RBC: 4.83 MIL/uL (ref 4.22–5.81)
RDW: 16 % — ABNORMAL HIGH (ref 11.5–15.5)
WBC: 4.2 10*3/uL (ref 4.0–10.5)

## 2017-03-04 LAB — URINALYSIS, ROUTINE W REFLEX MICROSCOPIC
Bilirubin Urine: NEGATIVE
Glucose, UA: NEGATIVE mg/dL
Hgb urine dipstick: NEGATIVE
Ketones, ur: NEGATIVE mg/dL
Leukocytes, UA: NEGATIVE
Nitrite: NEGATIVE
Protein, ur: NEGATIVE mg/dL
Specific Gravity, Urine: 1.018 (ref 1.005–1.030)
pH: 6 (ref 5.0–8.0)

## 2017-03-04 LAB — COMPREHENSIVE METABOLIC PANEL
ALT: 50 U/L (ref 17–63)
AST: 52 U/L — ABNORMAL HIGH (ref 15–41)
Albumin: 3.9 g/dL (ref 3.5–5.0)
Alkaline Phosphatase: 78 U/L (ref 38–126)
Anion gap: 8 (ref 5–15)
BUN: 13 mg/dL (ref 6–20)
CO2: 29 mmol/L (ref 22–32)
Calcium: 9.6 mg/dL (ref 8.9–10.3)
Chloride: 107 mmol/L (ref 101–111)
Creatinine, Ser: 0.68 mg/dL (ref 0.61–1.24)
GFR calc Af Amer: >60 mL/min (ref >60)
GFR calc non Af Amer: >60 mL/min (ref >60)
Glucose, Bld: 105 mg/dL — ABNORMAL HIGH (ref 65–99)
Potassium: 3.8 mmol/L (ref 3.5–5.1)
Sodium: 144 mmol/L (ref 135–145)
Total Bilirubin: 0.7 mg/dL (ref 0.3–1.2)
Total Protein: 6.5 g/dL (ref 6.5–8.1)

## 2017-03-04 LAB — APTT: aPTT: 25 seconds (ref 24–36)

## 2017-03-04 LAB — SURGICAL PCR SCREEN
MRSA, PCR: NEGATIVE
Staphylococcus aureus: NEGATIVE

## 2017-03-04 LAB — PROTIME-INR
INR: 0.99
Prothrombin Time: 13.1 seconds (ref 11.4–15.2)

## 2017-03-04 LAB — ABO/RH: ABO/RH(D): A POS

## 2017-03-04 NOTE — Progress Notes (Signed)
Anesthesia chart review: Patient is a 51 year old male scheduled for right axilla-bifemoral bypass on 03/07/17 by Dr. Darrick PennaFields.   History includes recent former smoker, myotonic muscular dystrophy (type 1), HTN, hiccups, nephrolithiasis, cholecystectomy, teeth extractions, left brachial vein DVT 01/29/16. He is non-ambulatory.  He lives at [facility] [place] (assisted living; [phone]).  PCP is listed as Dr. Caryl AdaJazma Phelps. Cardiologist is Dr. Elmarie Shileyiffany Linden--referred for preoperative risk assessment 02/22/17. He had a normal stress test 03/01/17. He saw Dr. Willa RoughJeffrey Katz previously in 2014 for ST elevation/early repolarization on EKG and did not recommend further work-up at that time.   Meds include Lipitor, Benadryl, TriCor, HCTZ, Reglan, Remeron, Protonix, Ultracet.  BP 103/73   Pulse 75   Temp 36.6 C   Resp 18   Ht 5\' 7"  (1.702 m)   Wt 118 lb (53.5 kg)   SpO2 98%   BMI 18.48 kg/m   EKG 11/23/16: SR, LVH, ST elevation, probable normal early repolarization pattern. Baseline wanderer in V1. No significant change since last tracing.  Nuclear stress test 03/01/17:  Nuclear stress EF: 77%.  The left ventricular ejection fraction is hyperdynamic (>65%).  There was no ST segment deviation noted during stress.  The study is normal.  Normal stress nuclear study with no ischemia or infarction; EF 77 with normal wall motion.   Echo 05/27/11: Study Conclusions Left ventricle: The cavity size was normal. Wall thickness was normal. Systolic function was mildly reduced. The estimated ejection fraction was in the range of 45% to 50%. Diffuse hypokinesis. Doppler parameters are consistent with abnormal left ventricular relaxation (grade 1 diastolic dysfunction).   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 Right popliteal artery occlusion. The posterior tibial artery reconstitutes. Long segment left  superficial femoral artery and left popliteal artery occlusion. The left anterior tibial and posterior tibial arteries reconstitute. NON-VASCULAR No acute abdominal or pelvic organ pathology.  CXR 11/23/16: IMPRESSION: Areas of lung scarring bilaterally.  No edema or consolidation.  Preoperative labs CBC, PT/PTT, UA WNL. ABG and CMET PENDING. T&s done.  He had recent normal stress test. He does have myotonic muscular dystrophy--no succinylcholine used for 05/10/16 EGD done under [facility]. If no acute changes then I would anticipate that he could proceed as planned. Darral Dash(Esther, RN to follow-up on pending labs and notify anesthesiologist as needed.)  Velna Ochsllison Jolly Carlini, PA-C [facility]iology Phone [phone] 03/04/2017 5:06 PM

## 2017-03-04 NOTE — Pre-Procedure Instructions (Signed)
Malik KocherRobert [name]  03/04/2017      [facility] [phone] - [place] - [address] AT [facility] OF [address] & [facility] [address] [place] [address] Phone: [phone] Fax: [phone]  [facility] [phone] - , [place] - [address] AT [facility] OF [address] [place] [address] Phone: [phone] Fax: [phone]  [facility] - [place], [place] - [address] [place] [address] Phone: [phone] Fax: [phone]  [facility] - [place] - [address] [place]  [phone] Phone: [phone] Fax: [phone]    Your procedure is scheduled on May 7.  [facility]  Call this number if you have problems the morning of surgery:  [phone]   Remember:  Do not eat food or drink liquids after midnight.  Take these medicines the morning of surgery with A SIP OF WATER Metoclopramide (Reglan) if needed, Pantoprazole (Protonix), Ultracet  Stop taking aspirin, BC's, Goody's, Herbal medications, Fish Oil, Ibuprofen, Advil, Motrin, Aleve, Vitamins   Do not wear jewelry, make-up or nail polish.  Do not wear lotions, powders, or perfumes, or deoderant.  Do not shave 48 hours prior to surgery.  Men may shave face and neck.  Do not bring valuables to the hospital.  [facility] is not responsible for any belongings or valuables.  Contacts, dentures or bridgework may not be worn into surgery.  Leave your suitcase in the car.  After surgery it may be brought to your room.  For patients admitted to the hospital, discharge time will be determined by your treatment team.  Patients discharged the day of surgery will not be allowed to drive home.   Special instructions:  [place] - Preparing for Surgery  Before surgery, you can play an important role.  Because skin is not sterile, your skin needs to be as  free of germs as possible.  You can reduce the number of germs on you skin by washing with CHG (chlorahexidine gluconate) soap before surgery.  CHG is an antiseptic cleaner which kills germs and bonds with the skin to continue killing germs even after washing.  Please DO NOT use if you have an allergy to CHG or antibacterial soaps.  If your skin becomes reddened/irritated stop using the CHG and inform your nurse when you arrive at Short Stay.  Do not shave (including legs and underarms) for at least 48 hours prior to the first CHG shower.  You may shave your face.  Please follow these instructions carefully:   1.  Shower with CHG Soap the night before surgery and the  morning of Surgery.  2.  If you choose to wash your hair, wash your hair first as usual with your  normal shampoo.  3.  After you shampoo, rinse your hair and body thoroughly to remove the  Shampoo.  4.  Use CHG as you would any other liquid soap.  You can apply chg directly  to the skin and wash gently with scrungie or a clean washcloth.  5.  Apply the CHG Soap to your body ONLY FROM THE NECK DOWN.  Do not use on open wounds or open sores.  Avoid contact with your eyes,       ears, mouth and genitals (private parts).  Wash genitals (private parts)  with your normal soap.  6.  Wash  thoroughly, paying special attention to the area where your surgery  will be performed.  7.  Thoroughly rinse your body with warm water from the neck down.  8.  DO NOT shower/wash with your normal soap after using and rinsing off the CHG Soap.  9.  Pat yourself dry with a clean towel.            10.  Wear clean pajamas.            11.  Place clean sheets on your bed the night of your first shower and do not  sleep with pets.  Day of Surgery  Do not apply any lotions/deoderants the morning of surgery.  Please wear clean clothes to the hospital/surgery center.     Please read over the following fact sheets that you were given. Pain Booklet, Coughing  and Deep Breathing, MRSA Information and Surgical Site Infection Prevention

## 2017-03-04 NOTE — Progress Notes (Addendum)
Sates he sees a Dr at [facility]- doesn't know the Dr's name Cardiologist is Dr. Duke Salviaandolph Denies nay chest pain, Cough, or fever. States he hasn't smoked in 2 weeks instructed not to smoke on the day of surgery.. Lab unable to get Blood gas today will need Day of surgery.

## 2017-03-07 ENCOUNTER — Inpatient Hospital Stay (HOSPITAL_COMMUNITY)
Admission: RE | Admit: 2017-03-07 | Discharge: 2017-03-11 | DRG: 253 | Disposition: A | Discharge status: Home Health | Payer: Medicaid Other | Source: Ambulatory Visit | Attending: Vascular Surgery | Admitting: Vascular Surgery

## 2017-03-07 ENCOUNTER — Inpatient Hospital Stay (HOSPITAL_COMMUNITY): Payer: Medicaid Other | Admitting: Certified Registered Nurse Anesthetist

## 2017-03-07 ENCOUNTER — Encounter (HOSPITAL_COMMUNITY)
Admission: RE | Disposition: A | Discharge status: Home Health | Payer: Self-pay | Source: Ambulatory Visit | Attending: Vascular Surgery

## 2017-03-07 ENCOUNTER — Encounter (HOSPITAL_COMMUNITY): Payer: Self-pay | Admitting: *Deleted

## 2017-03-07 DIAGNOSIS — I70293 Other atherosclerosis of native arteries of extremities, bilateral legs: Secondary | ICD-10-CM | POA: Diagnosis present

## 2017-03-07 DIAGNOSIS — I10 Essential (primary) hypertension: Secondary | ICD-10-CM | POA: Diagnosis present

## 2017-03-07 DIAGNOSIS — Z86718 Personal history of other venous thrombosis and embolism: Secondary | ICD-10-CM

## 2017-03-07 DIAGNOSIS — G7111 Myotonic muscular dystrophy: Secondary | ICD-10-CM | POA: Diagnosis present

## 2017-03-07 DIAGNOSIS — I739 Peripheral vascular disease, unspecified: Secondary | ICD-10-CM

## 2017-03-07 DIAGNOSIS — Z7982 Long term (current) use of aspirin: Secondary | ICD-10-CM

## 2017-03-07 DIAGNOSIS — R339 Retention of urine, unspecified: Secondary | ICD-10-CM | POA: Diagnosis not present

## 2017-03-07 DIAGNOSIS — Z66 Do not resuscitate: Secondary | ICD-10-CM | POA: Diagnosis present

## 2017-03-07 DIAGNOSIS — R531 Weakness: Secondary | ICD-10-CM | POA: Diagnosis present

## 2017-03-07 DIAGNOSIS — I70222 Atherosclerosis of native arteries of extremities with rest pain, left leg: Secondary | ICD-10-CM

## 2017-03-07 DIAGNOSIS — G710 Muscular dystrophy: Secondary | ICD-10-CM | POA: Diagnosis present

## 2017-03-07 DIAGNOSIS — F17210 Nicotine dependence, cigarettes, uncomplicated: Secondary | ICD-10-CM | POA: Diagnosis present

## 2017-03-07 DIAGNOSIS — R5381 Other malaise: Secondary | ICD-10-CM | POA: Diagnosis not present

## 2017-03-07 HISTORY — PX: PATCH ANGIOPLASTY: SHX6230

## 2017-03-07 HISTORY — PX: AXILLARY-FEMORAL BYPASS GRAFT: SHX894

## 2017-03-07 HISTORY — PX: ENDARTERECTOMY FEMORAL: SHX5804

## 2017-03-07 LAB — MRSA PCR SCREENING: MRSA by PCR: NEGATIVE

## 2017-03-07 LAB — CBC
HCT: 43.4 % (ref 39.0–52.0)
Hemoglobin: 14.6 g/dL (ref 13.0–17.0)
MCH: 31.7 pg (ref 26.0–34.0)
MCHC: 33.6 g/dL (ref 30.0–36.0)
MCV: 94.3 fL (ref 78.0–100.0)
Platelets: 158 10*3/uL (ref 150–400)
RBC: 4.6 MIL/uL (ref 4.22–5.81)
RDW: 16.6 % — ABNORMAL HIGH (ref 11.5–15.5)
WBC: 10.9 10*3/uL — ABNORMAL HIGH (ref 4.0–10.5)

## 2017-03-07 LAB — CREATININE, SERUM
Creatinine, Ser: 0.61 mg/dL (ref 0.61–1.24)
GFR calc Af Amer: >60 mL/min (ref >60)
GFR calc non Af Amer: >60 mL/min (ref >60)

## 2017-03-07 SURGERY — CREATION, BYPASS, ARTERIAL, AXILLARY TO BILATERAL FEMORAL, USING GRAFT
Anesthesia: General | Site: Groin | Laterality: Right

## 2017-03-07 MED ORDER — LACTATED RINGERS IV SOLN
INTRAVENOUS | Status: DC | PRN
  Administered 2017-03-07 (×2): via INTRAVENOUS

## 2017-03-07 MED ORDER — ACETAMINOPHEN 650 MG RE SUPP: 325.0000 mg | RECTAL | Status: DC | PRN

## 2017-03-07 MED ORDER — PHENOL 1.4 % MT LIQD: 1.0000 | OROMUCOSAL | Status: DC | PRN

## 2017-03-07 MED ORDER — SUGAMMADEX SODIUM 200 MG/2ML IV SOLN
INTRAVENOUS | Status: DC | PRN
  Administered 2017-03-07: 50 mg via INTRAVENOUS
  Administered 2017-03-07: 100 mg via INTRAVENOUS

## 2017-03-07 MED ORDER — ONDANSETRON HCL 4 MG/2ML IJ SOLN
INTRAMUSCULAR | Status: DC | PRN
  Administered 2017-03-07: 4 mg via INTRAVENOUS

## 2017-03-07 MED ORDER — LABETALOL HCL 5 MG/ML IV SOLN: 10.0000 mg | INTRAVENOUS | Status: DC | PRN

## 2017-03-07 MED ORDER — LABETALOL HCL 5 MG/ML IV SOLN
INTRAVENOUS | Status: AC
  Filled 2017-03-07: qty 4

## 2017-03-07 MED ORDER — HYDRALAZINE HCL 20 MG/ML IJ SOLN: 5.0000 mg | INTRAMUSCULAR | Status: DC | PRN

## 2017-03-07 MED ORDER — PHENYLEPHRINE HCL 10 MG/ML IJ SOLN
INTRAMUSCULAR | Status: DC | PRN
  Administered 2017-03-07: 40 ug via INTRAVENOUS
  Administered 2017-03-07: 80 ug via INTRAVENOUS

## 2017-03-07 MED ORDER — METOPROLOL TARTRATE 5 MG/5ML IV SOLN: 2.0000 mg | INTRAVENOUS | Status: DC | PRN

## 2017-03-07 MED ORDER — MORPHINE SULFATE (PF) 4 MG/ML IV SOLN: 2.0000 mg | INTRAVENOUS | Status: DC | PRN

## 2017-03-07 MED ORDER — CHLORHEXIDINE GLUCONATE 4 % EX LIQD: 60.0000 mL | Freq: Once | CUTANEOUS | Status: DC

## 2017-03-07 MED ORDER — PROPOFOL 10 MG/ML IV BOLUS
INTRAVENOUS | Status: AC
  Filled 2017-03-07: qty 40

## 2017-03-07 MED ORDER — ENSURE PO LIQD: 237.0000 mL | Freq: Three times a day (TID) | ORAL | Status: DC

## 2017-03-07 MED ORDER — PANTOPRAZOLE SODIUM 40 MG PO TBEC
40.0000 mg | DELAYED_RELEASE_TABLET | Freq: Every day | ORAL | Status: DC
  Administered 2017-03-07 – 2017-03-11 (×5): 40 mg via ORAL
  Filled 2017-03-07 (×5): qty 1

## 2017-03-07 MED ORDER — OXYCODONE-ACETAMINOPHEN 5-325 MG PO TABS
1.0000 | ORAL_TABLET | Freq: Four times a day (QID) | ORAL | Status: DC | PRN
  Administered 2017-03-07 – 2017-03-08 (×3): 1 via ORAL
  Filled 2017-03-07: qty 1
  Filled 2017-03-07: qty 2
  Filled 2017-03-07: qty 1

## 2017-03-07 MED ORDER — ROCURONIUM BROMIDE 100 MG/10ML IV SOLN
INTRAVENOUS | Status: DC | PRN
  Administered 2017-03-07: 40 mg via INTRAVENOUS

## 2017-03-07 MED ORDER — LIDOCAINE HCL (CARDIAC) 20 MG/ML IV SOLN
INTRAVENOUS | Status: DC | PRN
  Administered 2017-03-07: 80 mg via INTRAVENOUS

## 2017-03-07 MED ORDER — DEXTROSE 5 % IV SOLN
1.5000 g | Freq: Two times a day (BID) | INTRAVENOUS | Status: AC
  Administered 2017-03-07 – 2017-03-08 (×2): 1.5 g via INTRAVENOUS
  Filled 2017-03-07 (×3): qty 1.5

## 2017-03-07 MED ORDER — DIPHENHYDRAMINE HCL 25 MG PO CAPS: 25.0000 mg | ORAL_CAPSULE | Freq: Three times a day (TID) | ORAL | Status: DC | PRN

## 2017-03-07 MED ORDER — SODIUM CHLORIDE 0.9 % IV SOLN: 500.0000 mL | Freq: Once | INTRAVENOUS | Status: DC | PRN

## 2017-03-07 MED ORDER — FENTANYL CITRATE (PF) 100 MCG/2ML IJ SOLN
INTRAMUSCULAR | Status: DC | PRN
  Administered 2017-03-07: 100 ug via INTRAVENOUS

## 2017-03-07 MED ORDER — MAGNESIUM SULFATE 2 GM/50ML IV SOLN
2.0000 g | Freq: Every day | INTRAVENOUS | Status: DC | PRN
  Filled 2017-03-07: qty 50

## 2017-03-07 MED ORDER — ACETAMINOPHEN 325 MG PO TABS: 325.0000 mg | ORAL_TABLET | ORAL | Status: DC | PRN

## 2017-03-07 MED ORDER — HEPARIN SODIUM (PORCINE) 1000 UNIT/ML IJ SOLN
INTRAMUSCULAR | Status: DC | PRN
  Administered 2017-03-07: 3000 [IU] via INTRAVENOUS
  Administered 2017-03-07: 6000 [IU] via INTRAVENOUS

## 2017-03-07 MED ORDER — 0.9 % SODIUM CHLORIDE (POUR BTL) OPTIME
TOPICAL | Status: DC | PRN
  Administered 2017-03-07: 1000 mL

## 2017-03-07 MED ORDER — FENTANYL CITRATE (PF) 100 MCG/2ML IJ SOLN
INTRAMUSCULAR | Status: AC
  Filled 2017-03-07: qty 2

## 2017-03-07 MED ORDER — MIRTAZAPINE 15 MG PO TABS
15.0000 mg | ORAL_TABLET | Freq: Every day | ORAL | Status: DC
  Administered 2017-03-07 – 2017-03-10 (×4): 15 mg via ORAL
  Filled 2017-03-07 (×4): qty 1

## 2017-03-07 MED ORDER — POTASSIUM CHLORIDE CRYS ER 20 MEQ PO TBCR: 20.0000 meq | EXTENDED_RELEASE_TABLET | Freq: Every day | ORAL | Status: DC | PRN

## 2017-03-07 MED ORDER — PROPOFOL 10 MG/ML IV BOLUS
INTRAVENOUS | Status: DC | PRN
  Administered 2017-03-07: 150 mg via INTRAVENOUS
  Administered 2017-03-07 (×2): 25 mg via INTRAVENOUS

## 2017-03-07 MED ORDER — ATORVASTATIN CALCIUM 40 MG PO TABS
40.0000 mg | ORAL_TABLET | Freq: Every evening | ORAL | Status: DC
  Administered 2017-03-07 – 2017-03-10 (×4): 40 mg via ORAL
  Filled 2017-03-07 (×4): qty 1

## 2017-03-07 MED ORDER — ONDANSETRON HCL 4 MG/2ML IJ SOLN
4.0000 mg | Freq: Four times a day (QID) | INTRAMUSCULAR | Status: DC | PRN
  Administered 2017-03-07: 4 mg via INTRAVENOUS
  Filled 2017-03-07: qty 2

## 2017-03-07 MED ORDER — SODIUM CHLORIDE 0.9 % IV SOLN: INTRAVENOUS | Status: DC

## 2017-03-07 MED ORDER — HEPARIN SODIUM (PORCINE) 1000 UNIT/ML IJ SOLN
INTRAMUSCULAR | Status: AC
  Filled 2017-03-07: qty 1

## 2017-03-07 MED ORDER — SODIUM CHLORIDE 0.9 % IV SOLN
INTRAVENOUS | Status: DC
  Administered 2017-03-07: 19:00:00 via INTRAVENOUS

## 2017-03-07 MED ORDER — METOCLOPRAMIDE HCL 10 MG PO TABS: 10.0000 mg | ORAL_TABLET | Freq: Three times a day (TID) | ORAL | Status: DC | PRN

## 2017-03-07 MED ORDER — DOCUSATE SODIUM 100 MG PO CAPS
100.0000 mg | ORAL_CAPSULE | Freq: Every day | ORAL | Status: DC
  Administered 2017-03-09 – 2017-03-11 (×3): 100 mg via ORAL
  Filled 2017-03-07 (×4): qty 1

## 2017-03-07 MED ORDER — FENTANYL CITRATE (PF) 250 MCG/5ML IJ SOLN
INTRAMUSCULAR | Status: AC
  Filled 2017-03-07: qty 5

## 2017-03-07 MED ORDER — PHENYLEPHRINE HCL 10 MG/ML IJ SOLN
INTRAVENOUS | Status: DC | PRN
  Administered 2017-03-07: 10 ug/min via INTRAVENOUS

## 2017-03-07 MED ORDER — MORPHINE SULFATE (PF) 2 MG/ML IV SOLN: 2.0000 mg | INTRAVENOUS | Status: DC | PRN

## 2017-03-07 MED ORDER — BISACODYL 10 MG RE SUPP: 10.0000 mg | Freq: Every day | RECTAL | Status: DC | PRN

## 2017-03-07 MED ORDER — SODIUM CHLORIDE 0.9 % IV SOLN
INTRAVENOUS | Status: DC | PRN
  Administered 2017-03-07: 07:00:00

## 2017-03-07 MED ORDER — HYDROCHLOROTHIAZIDE 12.5 MG PO CAPS
12.5000 mg | ORAL_CAPSULE | Freq: Every day | ORAL | Status: DC
  Administered 2017-03-07 – 2017-03-11 (×4): 12.5 mg via ORAL
  Filled 2017-03-07 (×5): qty 1

## 2017-03-07 MED ORDER — MIDAZOLAM HCL 2 MG/2ML IJ SOLN
INTRAMUSCULAR | Status: AC
  Filled 2017-03-07: qty 2

## 2017-03-07 MED ORDER — FENOFIBRATE 160 MG PO TABS
160.0000 mg | ORAL_TABLET | Freq: Every day | ORAL | Status: DC
  Administered 2017-03-07 – 2017-03-11 (×5): 160 mg via ORAL
  Filled 2017-03-07 (×5): qty 1

## 2017-03-07 MED ORDER — FENTANYL CITRATE (PF) 100 MCG/2ML IJ SOLN
25.0000 ug | INTRAMUSCULAR | Status: DC | PRN
  Administered 2017-03-07: 25 ug via INTRAVENOUS

## 2017-03-07 MED ORDER — EPHEDRINE SULFATE 50 MG/ML IJ SOLN
INTRAMUSCULAR | Status: DC | PRN
  Administered 2017-03-07: 10 mg via INTRAVENOUS

## 2017-03-07 MED ORDER — POLYETHYLENE GLYCOL 3350 17 G PO PACK: 17.0000 g | PACK | Freq: Every day | ORAL | Status: DC | PRN

## 2017-03-07 MED ORDER — ENSURE ENLIVE PO LIQD
237.0000 mL | Freq: Three times a day (TID) | ORAL | Status: DC
  Administered 2017-03-11: 237 mL via ORAL

## 2017-03-07 MED ORDER — GUAIFENESIN-DM 100-10 MG/5ML PO SYRP: 15.0000 mL | ORAL_SOLUTION | ORAL | Status: DC | PRN

## 2017-03-07 MED ORDER — PROTAMINE SULFATE 10 MG/ML IV SOLN
INTRAVENOUS | Status: DC | PRN
  Administered 2017-03-07: 50 mg via INTRAVENOUS

## 2017-03-07 MED ORDER — GLYCOPYRROLATE 0.2 MG/ML IJ SOLN
INTRAMUSCULAR | Status: DC | PRN
  Administered 2017-03-07: .2 mg via INTRAVENOUS

## 2017-03-07 MED ORDER — ENOXAPARIN SODIUM 30 MG/0.3ML ~~LOC~~ SOLN
30.0000 mg | SUBCUTANEOUS | Status: DC
  Administered 2017-03-08: 30 mg via SUBCUTANEOUS
  Filled 2017-03-07: qty 0.3

## 2017-03-07 MED ORDER — DEXTROSE 5 % IV SOLN
1.5000 g | INTRAVENOUS | Status: AC
  Administered 2017-03-07: 1.5 g via INTRAVENOUS

## 2017-03-07 SURGICAL SUPPLY — 55 items
ADH SKN CLS APL DERMABOND .7 (GAUZE/BANDAGES/DRESSINGS) ×4
AGENT HMST SPONGE THK3/8 (HEMOSTASIS)
BAG ISL DRAPE 18X18 STRL (DRAPES) ×2
BAG ISOLATION DRAPE 18X18 (DRAPES) IMPLANT
CANISTER SUCT 3000ML PPV (MISCELLANEOUS) ×3 IMPLANT
CLIP TI MEDIUM 24 (CLIP) ×3 IMPLANT
CLIP TI WIDE RED SMALL 24 (CLIP) ×3 IMPLANT
DERMABOND ADVANCED (GAUZE/BANDAGES/DRESSINGS) ×2
DERMABOND ADVANCED .7 DNX12 (GAUZE/BANDAGES/DRESSINGS) IMPLANT
DRAIN SNY 10X20 3/4 PERF (WOUND CARE) IMPLANT
DRAPE INCISE IOBAN 66X45 STRL (DRAPES) ×3 IMPLANT
DRAPE ISOLATION BAG 18X18 (DRAPES) ×1
DRAPE SHEET LG 3/4 BI-LAMINATE (DRAPES) ×1 IMPLANT
ELECT CAUTERY BLADE 6.4 (BLADE) ×1 IMPLANT
ELECT REM PT RETURN 9FT ADLT (ELECTROSURGICAL) ×3
ELECTRODE REM PT RTRN 9FT ADLT (ELECTROSURGICAL) ×2 IMPLANT
EVACUATOR SILICONE 100CC (DRAIN) IMPLANT
GLOVE BIO SURGEON STRL SZ 6.5 (GLOVE) ×2 IMPLANT
GLOVE BIO SURGEON STRL SZ7 (GLOVE) ×1 IMPLANT
GLOVE BIO SURGEON STRL SZ7.5 (GLOVE) ×3 IMPLANT
GLOVE BIOGEL PI IND STRL 6.5 (GLOVE) IMPLANT
GLOVE BIOGEL PI IND STRL 7.0 (GLOVE) IMPLANT
GLOVE BIOGEL PI IND STRL 7.5 (GLOVE) IMPLANT
GLOVE BIOGEL PI INDICATOR 6.5 (GLOVE) ×5
GLOVE BIOGEL PI INDICATOR 7.0 (GLOVE) ×2
GLOVE BIOGEL PI INDICATOR 7.5 (GLOVE) ×1
GOWN STRL REUS W/ TWL LRG LVL3 (GOWN DISPOSABLE) ×6 IMPLANT
GOWN STRL REUS W/TWL LRG LVL3 (GOWN DISPOSABLE) ×11 IMPLANT
GRAFT CV 60X8STRG TUBE KNTD (Vascular Products) IMPLANT
GRAFT HEMASHIELD 8MM (Vascular Products) ×6 IMPLANT
GRAFT VASC STRG 30X8KNIT (Vascular Products) IMPLANT
HEMOSTAT SPONGE AVITENE ULTRA (HEMOSTASIS) IMPLANT
KIT BASIN OR (CUSTOM PROCEDURE TRAY) ×3 IMPLANT
KIT ROOM TURNOVER OR (KITS) ×3 IMPLANT
LOOP VESSEL MAXI BLUE (MISCELLANEOUS) ×1 IMPLANT
NS IRRIG 1000ML POUR BTL (IV SOLUTION) ×6 IMPLANT
PACK PERIPHERAL VASCULAR (CUSTOM PROCEDURE TRAY) ×3 IMPLANT
PAD ARMBOARD 7.5X6 YLW CONV (MISCELLANEOUS) ×6 IMPLANT
PATCH VASC XENOSURE 1CMX6CM (Vascular Products) ×3 IMPLANT
PATCH VASC XENOSURE 1X6 (Vascular Products) IMPLANT
PENCIL BUTTON HOLSTER BLD 10FT (ELECTRODE) ×1 IMPLANT
SPONGE LAP 4X18 X RAY DECT (DISPOSABLE) ×1 IMPLANT
STAPLER VISISTAT 35W (STAPLE) ×3 IMPLANT
SUT PROLENE 5 0 C 1 24 (SUTURE) ×6 IMPLANT
SUT PROLENE 6 0 CC (SUTURE) ×10 IMPLANT
SUT SILK 2 0 FS (SUTURE) ×3 IMPLANT
SUT SILK 3 0 (SUTURE) ×3
SUT SILK 3-0 18XBRD TIE 12 (SUTURE) IMPLANT
SUT VIC AB 2-0 SH 27 (SUTURE) ×6
SUT VIC AB 2-0 SH 27XBRD (SUTURE) IMPLANT
SUT VIC AB 3-0 SH 27 (SUTURE) ×12
SUT VIC AB 3-0 SH 27X BRD (SUTURE) ×10 IMPLANT
SUT VICRYL 4-0 PS2 18IN ABS (SUTURE) ×6 IMPLANT
TRAY FOLEY W/METER SILVER 16FR (SET/KITS/TRAYS/PACK) ×3 IMPLANT
WATER STERILE IRR 1000ML POUR (IV SOLUTION) ×3 IMPLANT

## 2017-03-07 NOTE — Anesthesia Postprocedure Evaluation (Signed)
Anesthesia Post Note  Patient: Malik [name]  Procedure(s) Performed: Procedure(s) (LRB): RIGHT AXILLA-BIFEMORAL BYPASS GRAFT USING HEMASHIELD GOLD VASCULAR GRAFT (Right) ENDARTERECTOMY LEFT FEMORAL ARTERY  (Left) PATCH ANGIOPLASTY LEFT FEMORAL ARTERY USING XENOSURE BIOLOGIC PATCH (Left)  Patient location during evaluation: PACU Anesthesia Type: General Level of consciousness: awake Pain management: pain level controlled Vital Signs Assessment: post-procedure vital signs reviewed and stable Respiratory status: spontaneous breathing Cardiovascular status: stable Anesthetic complications: no       Last Vitals:  Vitals:   03/07/17 1330 03/07/17 1604  BP: 121/78 107/83  Pulse: 65 84  Resp: 16 13  Temp: 36.6 C 36.6 C    Last Pain:  Vitals:   03/07/17 1604  TempSrc: Oral  PainSc:                  Berlene Dixson

## 2017-03-07 NOTE — Progress Notes (Addendum)
  Day of Surgery Note    Subjective:  c/o needing to go to the bathroom (Void).   Vitals:   03/07/17 0605 03/07/17 1120  BP:    Pulse:    Resp:    Temp: 97.8 F (36.6 C) (P) 97.2 F (36.2 C)    Incisions:   All are clean and dry without hematoma Extremities:  +ax fem palpable graft pulse; + fem fem graft pulse; +right peroneal doppler signal; unable to hear doppler signals on left Cardiac:  regular Lungs:  Non labored   Assessment/Plan:  This is a 51 y.o. male who is s/p Right axillary bifemoral bypass   -pt's bypass grafts are patent -reassured pt that he has a foley in  -Dr. Darrick PennaFields will be by to assess left leg as I don't hear doppler signals on the left -pt is previous DNR on prior admissions-Dr.  Fields to speak with family to verify this is still active.  Doreatha MassedSamantha Tykia Mellone, PA-C 03/07/2017 11:38 AM [phone]  ADDENDUM   Pt re-evaluated at bedside with Dr. Darrick PennaFields.  Pt with monophasic right PT and left AT. Ax and fem fem grafts are patent.  Pt to 4 east.  Pt is DNR code status per pt and is changed in the computer.  Doreatha MassedSamantha Maevis Mumby, [facility] 03/07/2017 1:19 PM

## 2017-03-07 NOTE — Op Note (Addendum)
Procedure: #1 Right axillary bifemoral bypass   Preoperative diagnosis: Rest pain left foot  Postoperative diagnosis: Same  Anesthesia: Gen.  Assistant: Leonides Sake MD, Doreatha Massed PA-C  Operative findings: #1  8 mm Dacron axillary bifemoral bypass           Procedure details: After obtaining informed consent, the patient was taken to the operating room. The patient was placed in supine position on the operating room table. After induction of general anesthesia, endotracheal intubation,and placement of a foley catheter  the patient was prepped and draped in usual sterile fashion from the chin all the way to the toes. The right arm and lett arm were tucked.   Next, a slightly oblique incision was made in the right infraclavicular space to dissect out the right axillary artery. The incision was carried through the subcutaneous tissues and down to the level of the fascia for the pectoralis muscle. The fascia was opened with cautery. The muscles were slightly divided and I tried to carry the dissection along the direction of the fibers. The axillary vein was identified. Several small side branches ligated and divided between silk ties. This was then dissected free circumferentially and pulled superiorly to allow exposure of the axillary artery just behind it. The axillary artery was dissected free circumferentially. Several small side branches were also ligated and divided between silk ties. Several centimeters of artery was dissected free to allow a reasonable surface for grafting.  An incision was made in longitudinal fashion in the right groin to expose the common femoral artery. This was dissected free circumferentially.  The profunda femoris and superficial femoral arteries were dissected free circumferentially and controlled with vessel loops.   Attention was then turned to the left groin. In similar fashion, the left groin was opened through a longitudinal incision. Incision was carried down  through the subcutaneous tissues down to level of the native common femoral profunda and  superficial femoral artery. This was dissected free circumferentially. This was then traced up to level the femoral bifurcation. The bifurcation was slightly high on the left side.  The profunda femoris artery was dissected free circumferentially. Dissection was then carried out on the common femoral artery posteriorly all the way up to the level of the inguinal ligament.  Next a subcutaneous tunnel was created connecting the right groin incision and then underneath the pectoralis major and minor muscle down to the right groin. An 8 mm dacron graft was then brought through this subcutaneous tunnel. A subcutaneous tunnel was also created connecting each groin incision.  An 8 mm dacron graft was brought through this.  The patient was given 6,000 units of intravenous heparin and an additional 3000 units of heparin during the case.    The axillary artery was clamped proximally and distally with Henley clamps. A longitudinal opening was made on the anteroinferior surface of the axillary artery and this arteriotomy was extended with Potts scissors. The 8 mm dacron graft was slightly beveled and sewn end of graft to side of artery using a running 6-0 Prolene suture. At completion, the anastomosis was forebled backbled and thoroughly flushed. The graft was clamped at its origin.  The left common femoral artery was then controlled proximally and distally with vascular clamps.  A longitudinal opening was made in the artery.  There was thickened plaque obstructing about 70% of the lumen so a femoral endarterectomy was performed from the distal external iliac to the distal common femoral artery with good endpoints obtained.  A bovine  pericardial patch was then used to patch the artery.  The 8 mm  Dacron graft was then beveled on the end and sewn end of graft to the side of the patch with a 6-0 Prolene suture. At completion  anastomosis was thoroughly backbled. This was then clamped. The graft was then measured to length to anastomose to the right axillary femoral bypass. This was controlled proximally with a peripheral DeBakey clamp and distally with a Henley clamp.   The right common femoral was then controlled proximally and distally with loops and a henley clamp.  It was opened longitudinally and was a much healthier artery.  The axillary limb of the graft was beveled and sewn end to side to the artery using a running 60 prolene suture with the usual flushing post anastomosis.  A longitudinal opening was made in the axillary femoral bypass approximately 2 cm above the right femoral anastomosis. The new femoral-femoral bypass graft was then sewn end of graft to side of the axillary femoral graft using a running 5-0 Prolene suture. Just prior to completion of the anastomosis, it was forebled backbled and thoroughly flushed. The anastomosis was secured, clamps released, and there was pulsatile flow left groin immediately. Patient had a strong dorsalis pedis and faint posterior tibial Doppler signal in the left foot.  There was a monophasic PT doppler in the right foot.  Hemostasis was then obtained with the assistance of 50 mg of protamine as well as pressure in all the incisions. Both groins were then closed in multiple layers of running 2 0 and 3-0 Vicryl suture. Each groin incision was closed with a 4 0 Vicryl subcuticular stitch.  The axillary incision was closed with a running 2-0 Vicryl suture to reapproximate the muscle fascia. Skin was then closed with a 4 0 vicryl subcuticular stitch.   Patient tolerated the procedure well and there were complications. Instrument sponge and needle counts correct in the case. The patient was taken to the recovery room in stable condition. Patient had audible DP doppler and faint posterior tibial Doppler signal in the left foot and right posterior tibial Doppler signal in right foot at the end  of the case.  Fabienne Brunsharles Fields, MD [facility] [place] Office: [phone] Pager: [phone]

## 2017-03-07 NOTE — Anesthesia Preprocedure Evaluation (Addendum)
Anesthesia Evaluation  Patient identified by MRN, date of birth, ID band Patient awake    Reviewed: Allergy & Precautions, NPO status , Patient's Chart, lab work & pertinent test results  Airway Mallampati: II  TM Distance: >3 FB     Dental   Pulmonary former smoker,    breath sounds clear to auscultation       Cardiovascular hypertension, + Peripheral Vascular Disease   Rhythm:Regular Rate:Normal     Neuro/Psych  Neuromuscular disease    GI/Hepatic negative GI ROS, Neg liver ROS,   Endo/Other  negative endocrine ROS  Renal/GU negative Renal ROS     Musculoskeletal   Abdominal   Peds  Hematology   Anesthesia Other Findings   Reproductive/Obstetrics                             Anesthesia Physical Anesthesia Plan  ASA: III  Anesthesia Plan: General   Post-op Pain Management:    Induction: Intravenous  Airway Management Planned: Oral ETT  Additional Equipment:   Intra-op Plan:   Post-operative Plan: Possible Post-op intubation/ventilation  Informed Consent: I have reviewed the patients History and Physical, chart, labs and discussed the procedure including the risks, benefits and alternatives for the proposed anesthesia with the patient or authorized representative who has indicated his/her understanding and acceptance.   Dental advisory given  Plan Discussed with: CRNA and Anesthesiologist  Anesthesia Plan Comments:         Anesthesia Quick Evaluation

## 2017-03-07 NOTE — Transfer of Care (Cosign Needed)
Immediate Anesthesia Transfer of Care Note  Patient: Malik [name]  Procedure(s) Performed: Procedure(s): RIGHT AXILLA-BIFEMORAL BYPASS GRAFT USING HEMASHIELD GOLD VASCULAR GRAFT (Right) ENDARTERECTOMY LEFT FEMORAL ARTERY  (Left) PATCH ANGIOPLASTY LEFT FEMORAL ARTERY USING XENOSURE BIOLOGIC PATCH (Left)  Patient Location: PACU  Anesthesia Type:General  Level of Consciousness: awake, alert  and oriented  Airway & Oxygen Therapy: Patient Spontanous Breathing and Patient connected to face mask oxygen  Post-op Assessment: Report given to RN, Post -op Vital signs reviewed and stable and Patient moving all extremities X 4  Post vital signs: Reviewed and stable  Last Vitals:  Vitals:   03/07/17 0602 03/07/17 0605  BP: 127/80   Pulse: (!) 56   Resp: (!) 22   Temp: 36.6 C 36.6 C    Last Pain:  Vitals:   03/07/17 0605  TempSrc: Oral      Patients Stated Pain Goal: 3 (03/07/17 [id number])  Complications: No apparent anesthesia complications

## 2017-03-07 NOTE — Interval H&P Note (Signed)
History and Physical Interval Note:  03/07/2017 7:06 AM  Malik [name]  has presented today for surgery, with the diagnosis of Peripheral Arterial Disease  I70.92  The various methods of treatment have been discussed with the patient and family. After consideration of risks, benefits and other options for treatment, the patient has consented to  Procedure(s): BYPASS GRAFT AXILLA-BIFEMORAL-RIGHT (Right) as a surgical intervention .  The patient's history has been reviewed, patient examined, no change in status, stable for surgery.  I have reviewed the patient's chart and labs.  Questions were answered to the patient's satisfaction.     Fabienne BrunsFields, Charles

## 2017-03-07 NOTE — Anesthesia Procedure Notes (Signed)
Procedure Name: Intubation Date/Time: 03/07/2017 7:52 AM Performed by: Daiva EvesAVENEL, Aleksey Newbern W Pre-anesthesia Checklist: Patient identified, Emergency Drugs available, Suction available, Patient being monitored and Timeout performed Patient Re-evaluated:Patient Re-evaluated prior to inductionOxygen Delivery Method: Circle system utilized Preoxygenation: Pre-oxygenation with 100% oxygen Intubation Type: IV induction Ventilation: Mask ventilation without difficulty Laryngoscope Size: Glidescope and 3 Grade View: Grade I Tube type: Oral Tube size: 7.5 mm Number of attempts: 1 Airway Equipment and Method: Stylet Placement Confirmation: ETT inserted through vocal cords under direct vision,  positive ETCO2 and breath sounds checked- equal and bilateral Secured at: 22 cm Dental Injury: Teeth and Oropharynx as per pre-operative assessment  Difficulty Due To: Difficulty was anticipated Comments: Small mouth opening. Grade 3 view with miller 2. Glidescope 3 utilized- grade 1 view.

## 2017-03-07 NOTE — Progress Notes (Signed)
[facility]em [facility] by Dr. Darrick PennaFields. Patient stable on arrival with NS at 75 mL, no oxygen at this time. Report concluded with RN, assessment in progress.

## 2017-03-07 NOTE — H&P (View-Only) (Signed)
    Referring Physician: Dr Phelps   Patient name: Malik [name]            MRN: [id number]        DOB: 03/08/1966          Sex: male   REASON FOR CONSULT: Lower extremity weakness and peripheral arterial disease   HPI: Malik [name] is a 50 y.o. 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  He was previously seen about 5 months ago and noted to have ABIs of 0.6 bilaterally. His symptoms did not seem to be consistent with peripheral arterial disease. This seemed more related to his muscular dystrophy. He is now no longer able to even stand to transfer. He currently smokes about a half a pack of cigarettes per day. He denies any rest pain in his feet. He has no nonhealing wounds. He returns today after recent CT angiogram for further evaluation of his lower extremity arterial tree. He is nonambulatory but does use his legs for transferring.    He denies history of diabetes. He did previously have a upper extremity DVT and was on Xarelto but currently is not requiring this. He does not ambulate enough to elicit claudication symptoms. He denies rest pain. He has no tissue loss. Other medical problems include hypertension which has been controlled.           Past Medical History:  Diagnosis Date  . Hiccups    . High blood pressure    . Hypertension    . 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HCC)               Past Surgical History:  Procedure Laterality Date  . CHOLECYSTECTOMY      . ESOPHAGOGASTRODUODENOSCOPY N/A 05/10/2016    Procedure: ESOPHAGOGASTRODUODENOSCOPY (EGD);  Surgeon: [name], MD;  Location: [facility];  Service: Endoscopy;  Laterality: N/A;  Please discuss sedation possibilities with Dr. Danis  . MULTIPLE TOOTH EXTRACTIONS   2007    Pt had all teeth pulled, has not yet gotten dentures.                Family History  Problem Relation Age of Onset  . COPD Mother    . Diabetes Mother        SOCIAL HISTORY: Social History           Social History  . Marital status: Single      Spouse name: N/A  . Number of children: 0  . Years of education: GED             Occupational History  .          Disabled               Social History Main Topics  . Smoking status: Current Some Day Smoker      Packs/day: 0.30      Types: Cigarettes      Start date: 11/02/1983  . Smokeless tobacco: Never Used        Comment: 4-5 cigs per day  . Alcohol use No  . Drug use: No        Comment: Pt smokes MJ ocassinally.  Denies ever using IV drugs.   . Sexual activity: Not Currently      Birth control/ protection: Condom        Comment: Pt states he always uses condomns when sexually active.              Other Topics Concern  . Not on file         Social History Narrative    Patient has stated to me that he wishes to be DNR/DNI.  We have discussed the slow progression that we anticipate with his myotonic muscular dystrophy, and he does not want interventions to artificially prolong his life such as trach/vent or feeding tube.  His father who is next of kin was present for this discussion and agrees, understand's Fayez's wishes.  A traveling DNR was filled out 03/09/2013, and patient given information about living will and HCPOA.          CHAMBERLAIN,RACHEL, MD    03/09/2013              Patient lives in assisted living [facility].    Disabled.    Education GED.    Right handed.    Caffeine one cup of  Coffee daily.                      No Known Allergies    Current Outpatient Prescriptions on File Prior to Visit  Medication Sig Dispense Refill  . atorvastatin (LIPITOR) 40 MG tablet Take 1 tablet (40 mg total) by mouth daily. 30 tablet 11  . diphenhydrAMINE (ALLERGY RELIEF) 25 MG tablet Take 1 tablet (25 mg total) by mouth 3 (three) times daily as needed (itching). 30 tablet 4  . fenofibrate (TRICOR) 145 MG tablet Take 1 tablet (145 mg total) by mouth daily. 30 tablet 11  . hydrochlorothiazide  (MICROZIDE) 12.5 MG capsule TAKE (1) CAPSULE BY MOUTH ONCE DAILY. (Patient taking differently: Take 12.5 mg by mouth daily. ) 30 capsule 11  . metoCLOPramide (REGLAN) 10 MG tablet TAKE 1 TABLET BY MOUTH EVERY 8 HOURS AS NEEDED FOR HICCUPS/NAUSEA. (Patient taking differently: Take 10 mg by mouth every 8 (eight) hours as needed for nausea or vomiting. ) 30 tablet 0  . mirtazapine (REMERON) 15 MG tablet TAKE (1) TABLET BY MOUTH AT BEDTIME. (Patient taking differently: Take 15 mg by mouth at bedtime. ) 30 tablet 6  . pantoprazole (PROTONIX) 40 MG tablet Take 1 tablet (40 mg total) by mouth daily. 30 tablet 6  . sodium chloride (OCEAN) 0.65 % SOLN nasal spray Place 1 spray into both nostrils as needed for congestion. 1 Bottle 0  . traMADol-acetaminophen (ULTRACET) 37.5-325 MG tablet TAKE ONE TABLET BY MOUTH EVERY 8 HOURS AS NEEDED MODERATE PAIN 180 tablet 1  . aspirin 81 MG tablet Take 1 tablet (81 mg total) by mouth daily. 30 tablet 11  . guaiFENesin-dextromethorphan (ROBITUSSIN DM) 100-10 MG/5ML syrup Take 5 mLs by mouth every 4 (four) hours as needed for cough. (Patient not taking: Reported on 01/12/2017) 118 mL 0   No current facility-administered medications on file prior to visit.      ROS:    General:  No weight loss, Fever, chills   Vascular: No history of rest pain in feet.  No history of claudication.  No history of non-healing ulcer, No history of DVT    Pulmonary: No home oxygen, no productive cough, no hemoptysis,  No asthma or wheezing     Physical Examination    Vitals:   02/16/17 1414  BP: 121/85  Pulse: 91  Resp: 16  Temp: 97.3 F (36.3 C)  SpO2: 96%  Weight: 118 lb (53.5 kg)  Height: 5' 8" (1.727 m)     General:  Alert and oriented, no acute distress Skin: No   rash, no ulcer Extremity Pulses:  2+ radial, brachial Right side, left side one plus brachial absent radial  absent dorsalis pedis, posterior tibial pulses bilaterally Musculoskeletal: No deformity or edema,  thin atrophied muscles     Neurologic: Upper and lower extremity motor 5/5 and symmetric   DATA:  The patient had bilateral ABIs performed on 06/25/2016. ABIs were 0.6 bilaterally.   The patient had repeat ABIs March 2018 which were 0.39 bilaterally  I reviewed the images and the patient's CT angiogram today. 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 The right popliteal artery is occluded. There is one-vessel runoff via the posterior tibial artery.  In the left leg the left common iliac artery is occluded. The left superficial femoral artery is occluded. There is two-vessel runoff via the anterior and posterior tibial artery.   ASSESSMENT:  Patient with bilateral lower extremity weakness which has been progressively worse over the last 6 months. Difficult to determine if this is natural progression of his muscular dystrophy versus whether or not it is related to peripheral arterial disease. However, I suspect most of his weakness symptoms are secondary to his muscular dystrophy. His ABIs have significantly declined since I saw him 4 months ago. He has multi level arterial occlusive level disease. I do not believe his left anterior thigh pain would be related to peripheral arterial disease as this would be highly unusual symptom but his ABIs are low enough that he is at risk for limb loss. Anatomically the most durable procedure for this patient would be an aortobifemoral bypass however I do not believe he is a candidate for that due to his underlying lung muscular dystrophy and would be at high risk of ventilator and respiratory issues. Unfortunately do not believe he is going to have a percutaneous option for revascularization side believe his best option at this point would be axillary bifemoral bypass. I discussed the patient today the options of watchful waiting with the possibility that he could potentially lose his legs. Other option would be to place an axillary Bifemoral  bypass graft with the complications of respiratory cardiac possible graft thrombosis possible eventual limb loss explained to the patient. He has opted for the axbifem bypass at this point.    PLAN:    Right axillary bifemoral bypass after cardiac risk stratification. If he is deemed high risk from a cardiac standpoint we could reconsider whether or not he might be a candidate for a complex endovascular procedure for improvement of overall blood flow.   Chevez Sambrano, MD [facility] Office: [phone] Pager: [phone]    

## 2017-03-08 ENCOUNTER — Inpatient Hospital Stay (HOSPITAL_COMMUNITY): Payer: Medicaid Other

## 2017-03-08 ENCOUNTER — Encounter (HOSPITAL_COMMUNITY): Payer: Self-pay | Admitting: Vascular Surgery

## 2017-03-08 DIAGNOSIS — I739 Peripheral vascular disease, unspecified: Secondary | ICD-10-CM

## 2017-03-08 LAB — CBC
HCT: 43.1 % (ref 39.0–52.0)
Hemoglobin: 13.7 g/dL (ref 13.0–17.0)
MCH: 30.3 pg (ref 26.0–34.0)
MCHC: 31.8 g/dL (ref 30.0–36.0)
MCV: 95.4 fL (ref 78.0–100.0)
Platelets: 160 10*3/uL (ref 150–400)
RBC: 4.52 MIL/uL (ref 4.22–5.81)
RDW: 16.3 % — ABNORMAL HIGH (ref 11.5–15.5)
WBC: 7.7 10*3/uL (ref 4.0–10.5)

## 2017-03-08 LAB — BASIC METABOLIC PANEL
Anion gap: 10 (ref 5–15)
BUN: 8 mg/dL (ref 6–20)
CO2: 25 mmol/L (ref 22–32)
Calcium: 8.9 mg/dL (ref 8.9–10.3)
Chloride: 104 mmol/L (ref 101–111)
Creatinine, Ser: 0.7 mg/dL (ref 0.61–1.24)
GFR calc Af Amer: >60 mL/min (ref >60)
GFR calc non Af Amer: >60 mL/min (ref >60)
Glucose, Bld: 90 mg/dL (ref 65–99)
Potassium: 3.9 mmol/L (ref 3.5–5.1)
Sodium: 139 mmol/L (ref 135–145)

## 2017-03-08 LAB — POCT I-STAT 7, (LYTES, BLD GAS, ICA,H+H)
Acid-Base Excess: 5 mmol/L — ABNORMAL HIGH (ref 0.0–2.0)
Bicarbonate: 30.7 mmol/L — ABNORMAL HIGH (ref 20.0–28.0)
Calcium, Ion: 1.25 mmol/L (ref 1.15–1.40)
HCT: 41 % (ref 39.0–52.0)
Hemoglobin: 13.9 g/dL (ref 13.0–17.0)
O2 Saturation: 95 %
Patient temperature: 36
Potassium: 4.5 mmol/L (ref 3.5–5.1)
Sodium: 145 mmol/L (ref 135–145)
TCO2: 32 mmol/L (ref 0–100)
pCO2 arterial: 46.7 mmHg (ref 32.0–48.0)
pH, Arterial: 7.422 (ref 7.350–7.450)
pO2, Arterial: 70 mmHg — ABNORMAL LOW (ref 83.0–108.0)

## 2017-03-08 MED ORDER — ENOXAPARIN SODIUM 40 MG/0.4ML ~~LOC~~ SOLN
40.0000 mg | SUBCUTANEOUS | Status: DC
  Administered 2017-03-09 – 2017-03-10 (×2): 40 mg via SUBCUTANEOUS
  Filled 2017-03-08 (×2): qty 0.4

## 2017-03-08 MED ORDER — SODIUM CHLORIDE 0.9 % IV SOLN: INTRAVENOUS | Status: DC

## 2017-03-08 NOTE — Progress Notes (Signed)
VASCULAR LAB PRELIMINARY  ARTERIAL  ABI completed: Right ABI of 0.72 and left ABI of 0.78 are suggestive of moderate arterial occlusive disease at rest.   RIGHT    LEFT    PRESSURE WAVEFORM  PRESSURE WAVEFORM  BRACHIAL 106 Triphasic BRACHIAL 112 Triphasic  AT   AT 87 Monophasic  PT 81 Monophasic PT 59 Monophasic    RIGHT LEFT  ABI 0.72 0.78     Elsie StainGregory J Donterius Filley, RVT 03/08/2017, 1:47 PM

## 2017-03-08 NOTE — Evaluation (Addendum)
Occupational Therapy Evaluation Patient Details Name: Malik [name] MRN: [id number] DOB: 10-18-1966 Today's Date: 03/08/2017    History of Present Illness Malik [name] is a 51 y.o. 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  PMH also positive for HTN, hiccups, and peripheral arterial disease.  He underwent Right axillary bifemoral bypass on 03/07/17.   Clinical Impression   This 51 yo male admitted and underwent above presents to acute OT with deficits below (see OT problem list). Per pt he was Mod with all basic ADLs from W/C level pta and thus would greatly benefit from acute OT followed by OT on CIR to get back to PLOF to return to ALF.    Follow Up Recommendations  CIR;Supervision/Assistance - 24 hour    Equipment Recommendations  Other (comment) (TBD at next venue)       Precautions / Restrictions Precautions Precautions: Fall Restrictions Weight Bearing Restrictions: No      Mobility Bed Mobility Overal bed mobility: Needs Assistance Bed Mobility: Supine to Sit     Supine to sit: Max assist;+2 for physical assistance     General bed mobility comments: assist for legs off bed and to lift trunk upright  Transfers Overall transfer level: Needs assistance   Transfers: Squat Pivot Transfers     Squat pivot transfers: Total assist;+2 physical assistance     General transfer comment: lifted to pivot to chair on feet, pt moaning in pain with transfer    Balance Overall balance assessment: Needs assistance   Sitting balance-Leahy Scale: Poor Sitting balance - Comments: minguard to S at least for sitting balance, initially mod support until aclimated to upright     Standing balance-Leahy Scale: Zero Standing balance comment: unable to stand                           ADL either performed or assessed with clinical judgement   ADL Overall ADL's : Needs assistance/impaired Eating/Feeding: Total assistance  (supported sitting)   Grooming: Total assistance (supported sitting)   Upper Body Bathing: Maximal assistance (supported sitting)   Lower Body Bathing: Total assistance;Bed level   Upper Body Dressing : Total assistance (supported sitting)   Lower Body Dressing: Total assistance;Bed level   Toilet Transfer: Total assistance;+2 for physical assistance;Squat-pivot Toilet Transfer Details (indicate cue type and reason): use of belt and bed pad Toileting- Clothing Manipulation and Hygiene: Total assistance;Bed level          Pt reports he normally is able to feed himself by sitting in his W/C at a table and propping his arms up on the table and then holding his food while he brings his mouth to his food since he is unable to bring his food to his mouth due to arm weakness.                  Pertinent Vitals/Pain Pain Assessment: Faces Faces Pain Scale: Hurts whole lot Pain Location: abdomen/groin Pain Descriptors / Indicators: Grimacing;Guarding;Sore Pain Intervention(s): Monitored during session;Repositioned     Hand Dominance Right   Extremity/Trunk Assessment Upper Extremity Assessment Upper Extremity Assessment: RUE deficits/detail;LUE deficits/detail RUE Deficits / Details: Has limited movement of all joints for AROM, PROM WNL.  RUE Coordination: decreased fine motor;decreased gross motor LUE Deficits / Details: Has limited movement of all joints for AROM, PROM WNL.  LUE Coordination: decreased fine motor;decreased gross motor   Lower Extremity Assessment Lower Extremity Assessment:  RLE deficits/detail;LLE deficits/detail RLE Deficits / Details: PROM limited ankle DF, otherwise WFL, strength hip flexion 3/5, knee extension 2/5, ankle DF 0/5 LLE Deficits / Details: PROM limited ankle DF, otherwise WFL, strength hip flexion 3/5, knee extension 2/5, ankle DF 0/5   Cervical / Trunk Assessment Cervical / Trunk Assessment: Other exceptions Cervical / Trunk Exceptions:  unable to keep head upright, but can lift antigravity   Communication Communication Communication: No difficulties   Cognition Arousal/Alertness: Awake/alert Behavior During Therapy: Flat affect Overall Cognitive Status: No family/caregiver present to determine baseline cognitive functioning                                 General Comments: alert and oriented, but seems slow at times to comprehend   General Comments  Initially aunt in room and reports he was functioning with very little assist in ALF.             Home Living Family/patient expects to be discharged to:: Assisted living                             Home Equipment: Wheelchair - manual          Prior Functioning/Environment Level of Independence: Needs assistance  Gait / Transfers Assistance Needed: transferred to w/c independent from bed and to toilet  ADL's / Homemaking Assistance Needed: reports bathed and dressed himself            OT Problem List: Decreased strength;Decreased range of motion;Impaired balance (sitting and/or standing);Pain;Impaired tone;Impaired UE functional use      OT Treatment/Interventions: Self-care/ADL training;Therapeutic activities;Patient/family education;DME and/or AE instruction;Balance training    OT Goals(Current goals can be found in the care plan section) Acute Rehab OT Goals Patient Stated Goal: To return to prior level of function OT Goal Formulation: With patient Time For Goal Achievement: 03/22/17 Potential to Achieve Goals: Good  OT Frequency: Min 3X/week           Co-evaluation PT/OT/SLP Co-Evaluation/Treatment: Yes Reason for Co-Treatment: Complexity of the patient's impairments (multi-system involvement);For patient/therapist safety;To address functional/ADL transfers PT goals addressed during session: Balance;Mobility/safety with mobility OT goals addressed during session: ADL's and self-care;Strengthening/ROM      AM-PAC PT  "6 Clicks" Daily Activity     Outcome Measure Help from another person eating meals?: Total Help from another person taking care of personal grooming?: Total Help from another person toileting, which includes using toliet, bedpan, or urinal?: Total Help from another person bathing (including washing, rinsing, drying)?: Total Help from another person to put on and taking off regular upper body clothing?: Total Help from another person to put on and taking off regular lower body clothing?: Total 6 Click Score: 6   End of Session Equipment Utilized During Treatment: Gait belt Nurse Communication: Mobility status (pt needs A with eating)  Activity Tolerance: Patient tolerated treatment well Patient left: in chair;with call bell/phone within reach;with chair alarm set  OT Visit Diagnosis: Other abnormalities of gait and mobility (R26.89);Muscle weakness (generalized) (M62.81);Pain Pain - Right/Left: Right Pain - part of body: Leg                Time: 1610-9604 OT Time Calculation (min): 25 min Charges:  OT General Charges $OT Visit: 1 Procedure OT Evaluation $OT Eval Moderate Complexity: 1 Procedure Ignacia Palma, OTR/L [phone] 03/08/2017

## 2017-03-08 NOTE — Clinical Social Work Note (Signed)
Clinical Social Work Assessment  Patient Details  Name: Malik [name] MRN: [id number] Date of Birth: [birth year]  Date of referral:  03/08/17               Reason for consult:  Facility Placement, Discharge Planning                Permission sought to share information with:  Chartered certified accountant granted to share information::  Yes, Verbal Permission Granted  Name::        Agency::  [facility]  Relationship::     Contact Information:     Housing/Transportation Living arrangements for the past 2 months:  [place] of Information:  Patient, Medical Team Patient Interpreter Needed:  None Criminal Activity/Legal Involvement Pertinent to Current Situation/Hospitalization:  No - Comment as needed Significant Relationships:  Other(Comment) (Aunt) Lives with:  Facility Resident Do you feel safe going back to the place where you live?  Yes Need for family participation in patient care:  Yes (Comment)  Care giving concerns:  PT recommending CIR once medically stable for discharge.   Social Worker assessment / plan:  CSW met with patient. No supports at bedside. CSW introduced role and explained that PT recommendations would be discussed. Patient understands recommendation for CIR and CSW can initiate a SNF backup. Patient initially agreeable to SNF but as CSW began explaining first Medicaid barrier (Patient has to stay at facility for a minimum of 30 days for Medicaid to pay), patient stopped CSW and stated that he didn't want to be in a facility longer than he had to and would prefer to go home. Patient lives at [facility] and has been there for four years. Per notes, patient is wheelchair bound at baseline. No further concerns. CSW encouraged patient to contact CSW as needed. CSW will continue to follow patient for support and facilitate discharge back to ALF once medically stable.  Employment status:  Unemployed Forensic scientist:   Medicaid In [place] PT Recommendations:  Inpatient Rehab Consult Information / Referral to community resources:  [place]  Patient/Family's Response to care:  Patient prefers to return home rather than get any rehab. Patient's aunt supportive and involved in patient's care. Patient appreciated social work intervention.  Patient/Family's Understanding of and Emotional Response to Diagnosis, Current Treatment, and Prognosis:  Patient has a good understanding of the reason for admission. Patient appears pleased with hospital care.  Emotional Assessment Appearance:  Appears stated age Attitude/Demeanor/Rapport:  Other (Irritable) Affect (typically observed):  Irritable Orientation:  Oriented to Self, Oriented to Place, Oriented to  Time, Oriented to Situation Alcohol / Substance use:  Tobacco Use Psych involvement (Current and /or in the community):  No (Comment)  Discharge Needs  Concerns to be addressed:  Care Coordination Readmission within the last 30 days:  No Current discharge risk:  Dependent with Mobility Barriers to Discharge:  Continued Medical Work up   Candie Chroman, LCSW 03/08/2017, 3:47 PM

## 2017-03-08 NOTE — Progress Notes (Signed)
Inpatient Rehabilitation  OT has evaluated pt. and is recommending IP Rehab.  Patient was screened by Weldon PickingSusan Moussa Wiegand for appropriateness for an Inpatient Acute Rehab consult.  Have discussed with rehab PA.  Will await PT consult for further mobility assessment before making recommendations.  Please call if questions.  Weldon PickingSusan Gavon Majano PT Inpatient Rehab Admissions Coordinator Cell [phone] Office [phone]   Weldon PickingSusan Reinette Cuneo PT Inpatient Rehab Admissions Coordinator Cell [phone] Office [phone]

## 2017-03-08 NOTE — Progress Notes (Signed)
Myra GianottiBrabham, MD made aware of patient not voiding and refusing bladder scan. MD states to place a foley catheter if patient continues to not void.

## 2017-03-08 NOTE — Evaluation (Signed)
Physical Therapy Evaluation Patient Details Name: Malik [name] MRN: [id number] DOB: [birth year] Today's Date: 03/08/2017   History of Present Illness  Malik [name] is a 51 y.o. 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  PMH also positive for HTN, hiccups, and peripheral arterial disease.  He underwent Right axillary bifemoral bypass on 03/07/17.  Clinical Impression  Patient presents with decreased independence with mobility due to pain and weakness currently needing +2 total A to pivot to chair from bed, previously was able to moving himself into the chair independently.  Feel he will need continued skilled PT in the acute setting to address deficits and maximize mobility prior to d/c to CIR level rehab.    Follow Up Recommendations Supervision/Assistance - 24 hour;CIR    Equipment Recommendations  Other (comment) (TBA question need for power chair)    Recommendations for Other Services Rehab consult     Precautions / Restrictions Precautions Precautions: Fall Restrictions Weight Bearing Restrictions: No      Mobility  Bed Mobility Overal bed mobility: Needs Assistance Bed Mobility: Supine to Sit     Supine to sit: Max assist;+2 for physical assistance     General bed mobility comments: assist for legs off bed and to lift trunk upright  Transfers Overall transfer level: Needs assistance   Transfers: Squat Pivot Transfers     Squat pivot transfers: Total assist;+2 physical assistance     General transfer comment: lifted to pivot to chair on feet, pt moaning in pain with transfer  Ambulation/Gait                Stairs            Wheelchair Mobility    Modified Rankin (Stroke Patients Only)       Balance Overall balance assessment: Needs assistance   Sitting balance-Leahy Scale: Poor Sitting balance - Comments: minguard to S at least for sitting balance, initially mod support until aclimated to  upright     Standing balance-Leahy Scale: Zero Standing balance comment: unable to stand                             Pertinent Vitals/Pain Faces Pain Scale: Hurts whole lot Pain Location: abdomen/groin Pain Descriptors / Indicators: Grimacing;Guarding;Sore Pain Intervention(s): Monitored during session;Repositioned    Home Living Family/patient expects to be discharged to:: Assisted living               Home Equipment: Wheelchair - manual      Prior Function Level of Independence: Needs assistance   Gait / Transfers Assistance Needed: transferred to w/c independent from bed and to toilet   ADL's / Homemaking Assistance Needed: reports bathed and dressed himself        Hand Dominance   Dominant Hand: Right    Extremity/Trunk Assessment   Upper Extremity Assessment Upper Extremity Assessment: Defer to OT evaluation    Lower Extremity Assessment Lower Extremity Assessment: RLE deficits/detail;LLE deficits/detail RLE Deficits / Details: PROM limited ankle DF, otherwise WFL, strength hip flexion 3/5, knee extension 2/5, ankle DF 0/5 LLE Deficits / Details: PROM limited ankle DF, otherwise WFL, strength hip flexion 3/5, knee extension 2/5, ankle DF 0/5    Cervical / Trunk Assessment Cervical / Trunk Assessment: Other exceptions Cervical / Trunk Exceptions: unable to keep head upright, but can lift antigravity  Communication   Communication: No difficulties  Cognition Arousal/Alertness: Awake/alert Behavior  During Therapy: Flat affect Overall Cognitive Status: No family/caregiver present to determine baseline cognitive functioning                                 General Comments: alert and oriented, but seems slow at times to comprehend      General Comments General comments (skin integrity, edema, etc.): Initially aunt in room and reports he was functioning with very little assist in ALF.     Exercises     Assessment/Plan     PT Assessment Patient needs continued PT services  PT Problem List Decreased strength;Decreased activity tolerance;Pain;Decreased balance;Decreased mobility;Decreased knowledge of use of DME;Decreased safety awareness       PT Treatment Interventions DME instruction;Therapeutic activities;Therapeutic exercise;Balance training;Wheelchair mobility training;Functional mobility training    PT Goals (Current goals can be found in the Care Plan section)  Acute Rehab PT Goals Patient Stated Goal: To return to prior level of function PT Goal Formulation: With patient Time For Goal Achievement: 03/15/17 Potential to Achieve Goals: Fair    Frequency Min 3X/week   Barriers to discharge        Co-evaluation PT/OT/SLP Co-Evaluation/Treatment: Yes             AM-PAC PT "6 Clicks" Daily Activity  Outcome Measure Difficulty turning over in bed (including adjusting bedclothes, sheets and blankets)?: Total Difficulty moving from lying on back to sitting on the side of the bed? : Total Difficulty sitting down on and standing up from a chair with arms (e.g., wheelchair, bedside commode, etc,.)?: Total Help needed moving to and from a bed to chair (including a wheelchair)?: Total Help needed walking in hospital room?: Total Help needed climbing 3-5 steps with a railing? : Total 6 Click Score: 6    End of Session Equipment Utilized During Treatment: Gait belt Activity Tolerance: Patient limited by pain Patient left: with call bell/phone within reach;with chair alarm set Nurse Communication: Mobility status PT Visit Diagnosis: Other abnormalities of gait and mobility (R26.89);Muscle weakness (generalized) (M62.81);Pain Pain - Right/Left: Right Pain - part of body: Hip    Time:0906  - 0931     Charges: PT Eval Moderate Complexity       PT G CodesSheran Lawless, PT [phone] 03/08/2017   Elray Mcgregor 03/08/2017, 4:02 PM

## 2017-03-08 NOTE — Progress Notes (Addendum)
Patient hasn't voided since foley catheter was removed. Bladder scanned for 449 mls. Patient denies abdominal pain, discomfort, or feeling like he has to void at this time. No distention noted. Patient's primary RN Hayley notified.   Addendum: Patient stated he will know when he has to void and that he doesn't void often, just voids large amounts at one time. Refuses need for a foley catheter at this time.   Leanna BattlesEckelmann, [place] Mall Eileen, RN

## 2017-03-08 NOTE — Progress Notes (Signed)
Pt has arrived to 2w from 4e. Vitals obtained. Telemetry box applied and CCMD notified. Pt oriented to room. Pt denies needs at this time. Will continue current plan of care.   Berdine DanceLauren Moffitt BSN, RN

## 2017-03-08 NOTE — Progress Notes (Signed)
Patient had foley catheter discontinued at 0600 on 03/08/2017 and has not voided since. Patient states that he does not have the urge to urinate and does not want to attempt to urinate. RN attempted to bladder scan but the patient refused. RN will continue to monitor patient and encourage patient to attempt to void.

## 2017-03-08 NOTE — Progress Notes (Addendum)
[facility] [place]  Subjective  - Resting well over all.   Objective 105/74 91 99.4 F (37.4 C) (Oral) (!) 21 96%  Intake/Output Summary (Last 24 hours) at 03/08/17 0730 Last data filed at 03/08/17 0400  Gross per 24 hour  Intake          3076.25 ml  Output             2160 ml  Net           916.25 ml    Right subclavian incision clean and dry without hematoma Right/left groin incisions without hematoma Palpable axillary pulse Doppler signal right PT Left Doppler PT/DP signals Active range of motion of toes and legs intact at baseline Lungs non labored breathing   Assessment/Planning: POD # 1 Right axillary bifemoral bypass   Patent axillary bifem by pass graft  Incisions are healing well.   Left AT signal was not audible  this am, but goo PT signal on the left.   Thomasena EdisCOLLINS, EMMA [facility] 03/08/2017 7:30 AM -- Some soreness Brisk left AT PT doppler Right PT peroneal doppler Palpable ax and fem fem graft pulse  Incisions clean  To SNF when bed available Transfer 2w today  Fabienne Brunsharles Fields, MD [facility] [place] Office: [phone] Pager: [phone]  Laboratory Lab Results:  Recent Labs  03/07/17 1419 03/08/17 0336  WBC 10.9* 7.7  HGB 14.6 13.7  HCT 43.4 43.1  PLT 158 160   BMET  Recent Labs  03/07/17 1419 03/08/17 0336  NA  --  139  K  --  3.9  CL  --  104  CO2  --  25  GLUCOSE  --  90  BUN  --  8  CREATININE 0.61 0.70  CALCIUM  --  8.9    COAG Lab Results  Component Value Date   INR 0.99 03/04/2017   INR 0.88 SLIGHT HEMOLYSIS 05/01/2010   No results found for: PTT

## 2017-03-09 NOTE — Progress Notes (Addendum)
[facility] [place]  Subjective  - Doing OK, at baseline.   Objective 95/74 91 98 F (36.7 C) (Oral) 18 96%  Intake/Output Summary (Last 24 hours) at 03/09/17 0717 Last data filed at 03/09/17 0500  Gross per 24 hour  Intake           606.25 ml  Output              575 ml  Net            31.25 ml    Doppler PT right, left AT/PT Groin incisions soft clean and dry Right  Subclavian incision healing well Palpable pulse axillary right lateral trunk   Assessment/Planning: POD # 2 Right axillary bifemoral bypass   Pending social work for SNF placement By pass graft patent  Thomasena EdisCOLLINS, EMMA [facility] 03/09/2017 7:17 AM -- Graft pulse axillary suprapubic Feet warm incisions healing  ABI 0.7 bilaterally improved from 0.4 bilat preop  Did not get out of bed at all yesterday  Urinary retention overnight now has foley  A: s/p ax bifem: OOB today  Urinary retention: d/c foley tomorrow morning I and O after that may need urology follow up.  BUN/Cr normal yesterday  Awaiting SNF/Rehab placement  Fabienne Brunsharles Darlis Wragg, MD [facility] [place] Office: [phone] Pager: [phone]  Laboratory Lab Results:  Recent Labs  03/07/17 1419 03/08/17 0336  WBC 10.9* 7.7  HGB 14.6 13.7  HCT 43.4 43.1  PLT 158 160   BMET  Recent Labs  03/07/17 0730 03/07/17 1419 03/08/17 0336  NA 145  --  139  K 4.5  --  3.9  CL  --   --  104  CO2  --   --  25  GLUCOSE  --   --  90  BUN  --   --  8  CREATININE  --  0.61 0.70  CALCIUM  --   --  8.9    COAG Lab Results  Component Value Date   INR 0.99 03/04/2017   INR 0.88 SLIGHT HEMOLYSIS 05/01/2010   No results found for: PTT

## 2017-03-09 NOTE — Progress Notes (Signed)
Occupational Therapy Treatment Patient Details Name: Malik [name] MRN: [id number] DOB: [birth year] Today's Date: 03/09/2017    History of present illness Rene KocherRobert Poitra is a 51 y.o. 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  PMH also positive for HTN, hiccups, and peripheral arterial disease.  He underwent Right axillary bifemoral bypass on 03/07/17.   OT comments  Pt was seen for OT ADL retraining session today. He declined attempt to transfer to a chair, stating that he planned to stay in bed and sleep today. He was agreeable to PROM of LUE all joints, planes and RUE for elbow, forearm, wrist and digits only. Pt was assisted with grooming/washing his hands - total A required. Pt states that he plans to d/c to CIR for Rehab.   Follow Up Recommendations  CIR;Supervision/Assistance - 24 hour    Equipment Recommendations  Other (comment) (Defer to next venue)    Recommendations for Other Services      Precautions / Restrictions Precautions Precautions: Fall       Mobility Bed Mobility                  Transfers                      Balance                                           ADL either performed or assessed with clinical judgement   ADL Overall ADL's : Needs assistance/impaired Eating/Feeding: Total assistance (Supported sitting)   Grooming: Total assistance (Supported sitting)                                 General ADL Comments: Pt was seen for OT ADL retraining session today. He declined attempt to transfer to a chair, stating that he planned to stay in bed and sleep today. He was agreeable to PROM of LUE all joints, planes and RUE for elbow, forearm and digits only. Pt was assisted with grooming/washing his hands - total A required.     Vision       Perception     Praxis      Cognition Arousal/Alertness: Awake/alert Behavior During Therapy: Flat  affect Overall Cognitive Status: No family/caregiver present to determine baseline cognitive functioning                                 General Comments: alert and oriented, but seems slow at times to comprehend        Exercises Other Exercises Other Exercises: LUE Gentle PROM for shoulder flkex/exten, ABD/ADD, forearm pro/sup, wrist flexion/extension; RD/UD and digital flex'exten and composite fist. RUE - No shoulder PROM per pt request (secondary to surgical pain) but agreeable to gentle PROM for elbow flex/exten; forearm pro/sup; wrist flex/exten; RD/UD and digital flex'exten and composite fist.   Shoulder Instructions       General Comments      Pertinent Vitals/ Pain       Pain Assessment: No/denies pain Pain Score: 0-No pain  Home Living  Prior Functioning/Environment              Frequency  Min 3X/week        Progress Toward Goals  OT Goals(current goals can now be found in the care plan section)     Acute Rehab OT Goals Patient Stated Goal: Go to CIR/Rehab   Plan Discharge plan remains appropriate    Co-evaluation                 AM-PAC PT "6 Clicks" Daily Activity     Outcome Measure   Help from another person eating meals?: Total Help from another person taking care of personal grooming?: Total Help from another person toileting, which includes using toliet, bedpan, or urinal?: Total Help from another person bathing (including washing, rinsing, drying)?: Total Help from another person to put on and taking off regular upper body clothing?: Total Help from another person to put on and taking off regular lower body clothing?: Total 6 Click Score: 6    End of Session    OT Visit Diagnosis: Other abnormalities of gait and mobility (R26.89);Muscle weakness (generalized) (M62.81);Pain   Activity Tolerance Patient limited by fatigue   Patient Left in bed;with call  bell/phone within reach   Nurse Communication          Time: 1610-9604 OT Time Calculation (min): 18 min  Charges: OT General Charges $OT Visit: 1 Procedure OT Treatments $Therapeutic Activity: 8-22 mins  Kaylinn Dedic, OTR/L 03/09/17 10:42 AM     Virga Haltiwanger Beth Dixon 03/09/2017, 10:39 AM

## 2017-03-09 NOTE — Care Management Note (Signed)
Case Management Note Donn PieriniKristi Harlin Mazzoni RN, BSN Unit 2W-Case Manager [phone]  Patient Details  Name: Malik RoRobert C [name] MRN: [id number] Date of Birth: February 09, 1966  Subjective/Objective:   Pt admitted with PAD s/p- right axillary bifemoral bypass                  Action/Plan: PTA pt lived in an ALF-(hx of muscular dystrophy) per pt's aunt want SNF for discharge- CSW consulted for placement needs-   Expected Discharge Date:                  Expected Discharge Plan:  Skilled Nursing Facility  In-House Referral:  Clinical Social Work  Discharge planning Services  CM Consult  Post Acute Care Choice:    Choice offered to:     DME Arranged:    DME Agency:     HH Arranged:    HH Agency:     Status of Service:  In process, will continue to follow  If discussed at Long Length of Stay Meetings, dates discussed:    Discharge Disposition: skilled facility   Additional Comments:  Malik SpanWebster, Malik [place], RN 03/09/2017, 10:52 AM

## 2017-03-09 NOTE — Progress Notes (Signed)
Inpatient Rehabilitation  Pt has now completed evaluation and is also recommending IP Rehab.  At this time, we are recommending IP Rehab consult.  Please order if you are agreeable.  Please call if questions.  Weldon PickingSusan Zillah Alexie PT Inpatient Rehab Admissions Coordinator Cell [phone] Office [phone]

## 2017-03-09 NOTE — Progress Notes (Signed)
Pt voided 300 cc last night around 21:30.  Bladder scan performed this morning showed 629 cc in bladder.  Pt then urinated 275 cc.  Educated patient about possible need to reinsert foley catheter.  Pt currently refusing catheter at this time and stated he will attempt to urinate again.

## 2017-03-09 NOTE — Progress Notes (Signed)
Upon shift change, patient was given water and encouraged to urinate.  Patient was unable to urinate and so was bladder scanned.  Over 600 mL of urine was noted in bladder.  Per MD order, 16 Fr Foley was inserted and 600 mL of clear, yellow urine was drained from bladder.  Will continue to monitor.

## 2017-03-10 ENCOUNTER — Ambulatory Visit: Payer: Medicaid Other | Admitting: Vascular Surgery

## 2017-03-10 ENCOUNTER — Encounter (HOSPITAL_COMMUNITY): Payer: Medicaid Other

## 2017-03-10 ENCOUNTER — Encounter (HOSPITAL_COMMUNITY): Payer: Self-pay

## 2017-03-10 DIAGNOSIS — I739 Peripheral vascular disease, unspecified: Secondary | ICD-10-CM

## 2017-03-10 DIAGNOSIS — R5381 Other malaise: Secondary | ICD-10-CM

## 2017-03-10 LAB — URINALYSIS, ROUTINE W REFLEX MICROSCOPIC
Bilirubin Urine: NEGATIVE
Glucose, UA: NEGATIVE mg/dL
Hgb urine dipstick: NEGATIVE
Ketones, ur: 5 mg/dL — AB
Leukocytes, UA: NEGATIVE
Nitrite: NEGATIVE
Protein, ur: 30 mg/dL — AB
Specific Gravity, Urine: 1.02 (ref 1.005–1.030)
pH: 5 (ref 5.0–8.0)

## 2017-03-10 NOTE — Progress Notes (Signed)
Physical Therapy Treatment Patient Details Name: Malik [name] MRN: [id number] DOB: 16-Aug-1966 Today's Date: 03/10/2017    History of Present Illness Malik [name] is a 51 y.o. 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  PMH also positive for HTN, hiccups, and peripheral arterial disease.  He underwent Right axillary bifemoral bypass on 03/07/17.    PT Comments    Pt required encouragement to participate in therapy and performed limited functional mobility. Pt repeatedly stating he can get to EOB and slide into [facility] on his own at baseline. However, during session did not attempt to help lift his sacrum with transfers or scooting and would not scratch his nose or attempt to self feed stating positioning in chair is different than WC. Pt educated for bil LE exercises and benefit of mobility. Will continue to follow.    Follow Up Recommendations  Supervision/Assistance - 24 hour;CIR     Equipment Recommendations  Other (comment)    Recommendations for Other Services       Precautions / Restrictions Precautions Precautions: Fall Restrictions Weight Bearing Restrictions: No    Mobility  Bed Mobility Overal bed mobility: Needs Assistance Bed Mobility: Rolling;Sidelying to Sit Rolling: Max assist Sidelying to sit: Max assist;+2 for physical assistance       General bed mobility comments: max assist with HHA to reach for rail, assist to bring legs off of bed, elevate trunk and fully pivot to EOB  Transfers Overall transfer level: Needs assistance   Transfers: Squat Pivot Transfers     Squat pivot transfers: Total assist;+2 physical assistance     General transfer comment: attempted intial pivot with cues for hand placement and sequence with max assist to initiate and pt not helping, returned to bed then repeated squat pivot with total assist  Ambulation/Gait             General Gait Details: unable   Stairs             Wheelchair Mobility    Modified Rankin (Stroke Patients Only)       Balance Overall balance assessment: Needs assistance   Sitting balance-Leahy Scale: Fair Sitting balance - Comments: minguard EOB 3 min     Standing balance-Leahy Scale: Zero                              Cognition Arousal/Alertness: Awake/alert Behavior During Therapy: Flat affect Overall Cognitive Status: No family/caregiver present to determine baseline cognitive functioning                                 General Comments: alert and oriented but self-limiting, slow processing      Exercises General Exercises - Lower Extremity Long Arc Quad: AAROM;Both;10 reps;Seated Hip ABduction/ADduction: AROM;Both;Seated;10 reps Hip Flexion/Marching: AROM;Both;Seated;10 reps Toe Raises: AAROM;Both;Seated;10 reps Heel Raises: AROM;Both;Seated;10 reps    General Comments        Pertinent Vitals/Pain Pain Score: 7  Pain Location: abdomen/groin Pain Descriptors / Indicators: Grimacing;Guarding;Sore Pain Intervention(s): Limited activity within patient's tolerance;Monitored during session;Repositioned    Home Living                      Prior Function            PT Goals (current goals can now be found in the care plan section) Progress towards  PT goals: Not progressing toward goals - comment    Frequency    Min 2X/week      PT Plan Current plan remains appropriate;Frequency needs to be updated    Co-evaluation              AM-PAC PT "6 Clicks" Daily Activity  Outcome Measure  Difficulty turning over in bed (including adjusting bedclothes, sheets and blankets)?: Total Difficulty moving from lying on back to sitting on the side of the bed? : Total Difficulty sitting down on and standing up from a chair with arms (e.g., wheelchair, bedside commode, etc,.)?: Total Help needed moving to and from a bed to chair (including a wheelchair)?: Total Help  needed walking in hospital room?: Total Help needed climbing 3-5 steps with a railing? : Total 6 Click Score: 6    End of Session Equipment Utilized During Treatment: Gait belt Activity Tolerance: Patient tolerated treatment well Patient left: in chair;with call bell/phone within reach;with chair alarm set Nurse Communication: Mobility status PT Visit Diagnosis: Muscle weakness (generalized) (M62.81);Other abnormalities of gait and mobility (R26.89)     Time: [id number] PT Time Calculation (min) (ACUTE ONLY): 24 min  Charges:  $Therapeutic Activity: 23-37 mins                    G Codes:       Malik [name], PT [phone]  Malik [name] 03/10/2017, 11:07 AM

## 2017-03-10 NOTE — Progress Notes (Addendum)
  Progress Note    03/10/2017 7:49 AM 3 Days Post-Op  Subjective:  Sleeping a little difficult getting him to wake, but he does and follows commands  Tm 99.5 HR 70's-90's NSR 90's-100's systolic 93% RA  Vitals:   03/09/17 2039 03/10/17 0500  BP: 96/76 96/67  Pulse: 91 95  Resp: 18 18  Temp: 99.5 F (37.5 C) 99.5 F (37.5 C)    Physical Exam: Cardiac:  regular Lungs:  Non labored Incisions:   Right chest and bilateral groin incisions are clean and dry Extremities:  Bilateral feet are warm; +doppler signals left AT/PT and right PT as well as brisk doppler signals in graft.   CBC    Component Value Date/Time   WBC 7.7 03/08/2017 0336   RBC 4.52 03/08/2017 0336   HGB 13.7 03/08/2017 0336   HCT 43.1 03/08/2017 0336   PLT 160 03/08/2017 0336   MCV 95.4 03/08/2017 0336   MCH 30.3 03/08/2017 0336   MCHC 31.8 03/08/2017 0336   RDW 16.3 (H) 03/08/2017 0336   LYMPHSABS 1.6 11/23/2016 1546   MONOABS 0.4 11/23/2016 1546   EOSABS 0.0 11/23/2016 1546   BASOSABS 0.0 11/23/2016 1546    BMET    Component Value Date/Time   NA 139 03/08/2017 0336   K 3.9 03/08/2017 0336   CL 104 03/08/2017 0336   CO2 25 03/08/2017 0336   GLUCOSE 90 03/08/2017 0336   BUN 8 03/08/2017 0336   CREATININE 0.70 03/08/2017 0336   CREATININE 0.87 02/12/2016 1133   CALCIUM 8.9 03/08/2017 0336   GFRNONAA >60 03/08/2017 0336   GFRNONAA >89 02/12/2016 1133   GFRAA >60 03/08/2017 0336   GFRAA >89 02/12/2016 1133    INR    Component Value Date/Time   INR 0.99 03/04/2017 1530     Intake/Output Summary (Last 24 hours) at 03/10/17 0749 Last data filed at 03/10/17 [id number]  Gross per 24 hour  Intake                0 ml  Output             1300 ml  Net            -1300 ml     Assessment:  51 y.o. male is s/p:  Right axillary bifemoral bypass grafting  3 Days Post-Op  Plan: -pt with +doppler signals left AT/PT and right PT as well as brisk doppler signals in graft. -dc foley today and I&O  after that if needed-may need urology follow up -PT/OT recommending CIR consult-will order -DVT prophylaxis:  Lovenox -OOB to chair tid with meals   Doreatha MassedSamantha Rhyne, PA-C [facility] [phone] 03/10/2017 7:49 AM   Graft pulse feet warm Will send UA to rule out UTI D/c foley OK for rehab  Fabienne Brunsharles Brendia Dampier, MD [facility] [place] Office: [phone] Pager: [phone]

## 2017-03-10 NOTE — Progress Notes (Signed)
Clinical Social Worker met with patient and patients sister at bedside to discuss patients discharge options. Family was given information on SNF and ALF in the  area. Patient stated that he does not like [facility] (ALF) and would like to go to a SNF for long term care. CSW informed family that the hospital do not place for long term care but if pt is agreeable to go to SNF under rehab he can. CSW stated that if pt attended SNF for rehab he would have to sign over his SSI check to the facility to pay for the stay. Family was concerned because if pt give check to SNF then he will lose spot in ALF. Family decided that instead of going to SNF, pt will return to ALF with [facility] to follow. Pt stated he will stay at ALF until his family can find him a long term SNF placement. Pt stated he does not want CIR. CSW signing off as patient no longer has needs  Rhea Pink, MSW,  [place]

## 2017-03-10 NOTE — Consult Note (Signed)
Physical Medicine and Rehabilitation Consult Reason for Consult: Decreased functional mobility with gait disorder Referring Physician: Dr. Darrick Penna   HPI: Malik [name] is a 51 y.o. right handed male with history of muscular dystrophy, hypertension, tobacco abuse and peripheral vascular disease. Patient is a resident of a local [facility]. He is essentially wheelchair bound. He was able to pivot from wheelchair to the bed. Presented 03/07/2017 with rest pain left foot. Patient had been followed by vascular surgery recent ABIs of 0.6 bilaterally and CT angiogram showed right external iliac artery occlusion, right popliteal artery occlusion as well as left common iliac artery occlusion. Underwent right axillary bifemoral bypass 03/07/2017 per Dr. Darrick Penna. Hospital course pain management. Subcutaneous Lovenox for DVT prophylaxis. Physical and occupational therapy evaluations completed with recommendations of physical medicine rehabilitation consult.   Review of Systems  Constitutional: Positive for malaise/fatigue. Negative for chills and fever.  HENT: Negative for hearing loss.   Eyes: Negative for blurred vision and double vision.  Respiratory: Positive for shortness of breath. Negative for cough.   Cardiovascular: Positive for leg swelling. Negative for chest pain and palpitations.  Gastrointestinal: Positive for constipation. Negative for vomiting.  Genitourinary: Positive for urgency. Negative for dysuria, flank pain and hematuria.  Musculoskeletal: Positive for joint pain and myalgias.  Skin: Negative for rash.  Neurological: Positive for weakness. Negative for seizures.  Psychiatric/Behavioral: Positive for depression.  All other systems reviewed and are negative.  Past Medical History:  Diagnosis Date  . DVT (deep venous thrombosis) (HCC)    left brachial vein DVT 01/29/16  . Hiccups   . High blood pressure   . History of kidney stones   . Hypertension   . 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 AXILLARY-FEMORAL BYPASS GRAFT Right 03/07/2017   Procedure: RIGHT AXILLA-BIFEMORAL BYPASS GRAFT USING HEMASHIELD GOLD VASCULAR GRAFT;  Surgeon: [name], MD;  Location: [facility] OR;  Service: Vascular;  Laterality: Right;  . CHOLECYSTECTOMY    . ENDARTERECTOMY FEMORAL Left 03/07/2017   Procedure: ENDARTERECTOMY LEFT FEMORAL ARTERY ;  Surgeon: [name], MD;  Location: [facility] OR;  Service: Vascular;  Laterality: Left;  . ESOPHAGOGASTRODUODENOSCOPY N/A 05/10/2016   Procedure: ESOPHAGOGASTRODUODENOSCOPY (EGD);  Surgeon: [name], MD;  Location: [facility];  Service: Endoscopy;  Laterality: N/A;  Please discuss sedation possibilities with Dr. [name]  . MULTIPLE TOOTH EXTRACTIONS  2007   Pt had all teeth pulled, has not yet gotten dentures.   Marland Kitchen PATCH ANGIOPLASTY Left 03/07/2017   Procedure: PATCH ANGIOPLASTY LEFT FEMORAL ARTERY USING XENOSURE BIOLOGIC PATCH;  Surgeon: [name], MD;  Location: [facility] OR;  Service: Vascular;  Laterality: Left;   Family History  Problem Relation Age of Onset  . COPD Mother   . Diabetes Mother   . Breast cancer Mother   . Cancer Father   . Cancer Sister   . Muscular dystrophy Sister    Social History:  reports that he quit smoking about 2 weeks ago. His smoking use included Cigarettes. He started smoking about 33 years ago. He smoked 0.30 packs per day. He has never used smokeless tobacco. He reports that he does not drink alcohol or use drugs. Allergies:  Allergies  Allergen Reactions  . No Known Allergies    Medications Prior to Admission  Medication Sig Dispense Refill  . atorvastatin (LIPITOR) 40 MG tablet Take 40 mg by mouth every evening.    Marland Kitchen  diphenhydrAMINE (BENADRYL) 25 mg capsule Take 25 mg by mouth 3 (three) times daily as needed for itching.    . fenofibrate (TRICOR) 145 MG tablet Take 145 mg by mouth daily.    . hydrochlorothiazide (MICROZIDE) 12.5  MG capsule TAKE (1) CAPSULE BY MOUTH ONCE DAILY. (Patient taking differently: Take 12.5 mg by mouth daily. ) 30 capsule 11  . metoCLOPramide (REGLAN) 10 MG tablet Take 10 mg by mouth every 8 (eight) hours as needed for nausea (hiccups).    . mirtazapine (REMERON) 15 MG tablet Take 15 mg by mouth at bedtime.    . pantoprazole (PROTONIX) 40 MG tablet Take 40 mg by mouth daily.    . traMADol-acetaminophen (ULTRACET) 37.5-325 MG tablet TAKE ONE TABLET BY MOUTH EVERY 8 HOURS AS NEEDED MODERATE PAIN 180 tablet 1    Home: Home Living Family/patient expects to be discharged to:: Assisted living Home Equipment: Wheelchair - manual  Functional History: Prior Function Level of Independence: Needs assistance Gait / Transfers Assistance Needed: transferred to w/c independent from bed and to toilet  ADL's / Homemaking Assistance Needed: reports bathed and dressed himself Functional Status:  Mobility: Bed Mobility Overal bed mobility: Needs Assistance Bed Mobility: Supine to Sit Supine to sit: Max assist, +2 for physical assistance General bed mobility comments: assist for legs off bed and to lift trunk upright Transfers Overall transfer level: Needs assistance Transfers: Squat Pivot Transfers Squat pivot transfers: Total assist, +2 physical assistance General transfer comment: lifted to pivot to chair on feet, pt moaning in pain with transfer      ADL: ADL Overall ADL's : Needs assistance/impaired Eating/Feeding: Total assistance (Supported sitting) Grooming: Total assistance (Supported sitting) Upper Body Bathing: Maximal assistance (supported sitting) Lower Body Bathing: Total assistance, Bed level Upper Body Dressing : Total assistance (supported sitting) Lower Body Dressing: Total assistance, Bed level Toilet Transfer: Total assistance, +2 for physical assistance, Squat-pivot Toilet Transfer Details (indicate cue type and reason): use of belt and bed pad Toileting- Clothing  Manipulation and Hygiene: Total assistance, Bed level General ADL Comments: Pt was seen for OT ADL retraining session today. He declined attemt to transfer to a chair, stating that he planned to stay in bed and sleep today. He was agreeable to PROM of LUE all joints, planes and RUE for elbow, forearm and digits only. Pt was assisted with washing his hands - total A required.  Cognition: Cognition Overall Cognitive Status: No family/caregiver present to determine baseline cognitive functioning Orientation Level: Oriented X4 Cognition Arousal/Alertness: Awake/alert Behavior During Therapy: Flat affect Overall Cognitive Status: No family/caregiver present to determine baseline cognitive functioning General Comments: alert and oriented, but seems slow at times to comprehend  Blood pressure 96/67, pulse 95, temperature 99.5 F (37.5 C), temperature source Oral, resp. rate 18, height 5\' 8"  (1.727 m), weight 62.1 kg (136 lb 14.4 oz), SpO2 93 %. Physical Exam  Constitutional:  51 year old right-handed male appearing older than stated age  Eyes:  Pupils reactive to light  Neck: Normal range of motion. Neck supple. No thyromegaly present.  Cardiovascular: Normal rate, regular rhythm and normal heart sounds.   Respiratory:  Decreased breath sounds at the bases but clear to auscultation  GI: Soft. Bowel sounds are normal. He exhibits no distension.  Neurological:  Mood is flat but appropriate. Speech is a bit slurred but intelligible. Follows simple commands. UE grossly 4/5. LE: 1/5 prox at hips/knees and 4/5 adf/pf. Reasonable insight and awareness.   Skin:  Lower extremity bypass site clean and  dry    No results found for this or any previous visit (from the past 24 hour(s)). No results found.  Assessment/Plan: Diagnosis: decreased mobility and loss of functional independence due to PAD, s/p right axillary to bilateral fem bypass grafting 1. Does the need for close, 24 hr/day medical  supervision in concert with the patient's rehab needs make it unreasonable for this patient to be served in a less intensive setting? Yes 2. Co-Morbidities requiring supervision/potential complications: Muscular dystrophy, wound care, pain mgt 3. Due to bladder management, bowel management, safety, skin/wound care, disease management, medication administration, pain management and patient education, does the patient require 24 hr/day rehab nursing? Yes 4. Does the patient require coordinated care of a physician, rehab nurse, PT (1-2 hrs/day, 5 days/week) and OT (1-2 hrs/day, 5 days/week) to address physical and functional deficits in the context of the above medical diagnosis(es)? Yes Addressing deficits in the following areas: balance, endurance, locomotion, strength, transferring, bowel/bladder control, bathing, dressing, feeding, grooming, toileting and psychosocial support 5. Can the patient actively participate in an intensive therapy program of at least 3 hrs of therapy per day at least 5 days per week? Yes 6. The potential for patient to make measurable gains while on inpatient rehab is excellent 7. Anticipated functional outcomes upon discharge from inpatient rehab are modified independent and supervision  with PT, modified independent and supervision with OT, n/a with SLP. 8. ELOS: 10-15 days 9. Anticipated D/C setting: Home 10. Anticipated post D/C treatments: HH therapy 11. Overall Rehab/Functional Prognosis: excellent  RECOMMENDATIONS: This patient's condition is appropriate for continued rehabilitative care in the following setting: CIR Patient has agreed to participate in recommended program. Yes Note that insurance prior authorization may be required for reimbursement for recommended care.  Comment: Rehab Admissions Coordinator to follow up.  Thanks,  Ranelle Oyster, MD, [place]    Charlton Amor., PA-C 03/10/2017

## 2017-03-11 ENCOUNTER — Telehealth: Payer: Self-pay | Admitting: Vascular Surgery

## 2017-03-11 MED ORDER — OXYCODONE-ACETAMINOPHEN 5-325 MG PO TABS: 1.0000 | ORAL_TABLET | ORAL | 0 refills | Status: DC | PRN

## 2017-03-11 NOTE — NC FL2 (Signed)
[place] MEDICAID FL2 LEVEL OF CARE SCREENING TOOL     IDENTIFICATION  Patient Name: Malik [name] Birthdate: 1966-08-26 Sex: male Admission Date (Current Location): 03/07/2017  [facility] and [place] Number:  Producer, television/film/video and Address:  [facility] [place], [address] [place], [place] [address]      Provider Number: [id number]  Attending Physician Name and Address:  [name], MD  Relative Name and Phone Number:  Malvin Morrish, [phone]    Current Level of Care: Hospital Recommended Level of Care: Assisted Living Facility (home health to follow) Prior Approval Number:    Date Approved/Denied:   PASRR Number:    Discharge Plan: Home    Current Diagnoses: Patient Active Problem List   Diagnosis Date Noted  . PAD (peripheral artery disease) (HCC) 06/07/2016  . Leg pain, bilateral 06/02/2016  . Esophageal obstruction due to food impaction   . DVT of upper extremity (deep vein thrombosis) (HCC) 01/29/2016  . Residence in long term care facility 03/04/2014  . EKG abnormality 11/09/2013  . Health care maintenance 06/22/2013  . Epidermal cyst 06/22/2013  . Skin lesion of hand 06/22/2013  . 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 Hypertension, benign 01/20/2012  . Unspecified vitamin D deficiency 08/05/2011  . Vitamin B 12 deficiency 07/14/2011  . 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 Muscle weakness (generalized) 04/16/2011  . Weight finding 04/16/2011  . Tobacco user 04/16/2011    Orientation RESPIRATION BLADDER Height & Weight     Self, Time, Situation, Place  Normal Continent Weight: 136 lb 14.4 oz (62.1 kg) Height:  5\' 8"  (172.7 cm)  BEHAVIORAL SYMPTOMS/MOOD NEUROLOGICAL BOWEL NUTRITION STATUS      Continent Diet (heart healthy)  AMBULATORY STATUS COMMUNICATION OF NEEDS Skin   Extensive Assist Verbally Normal                       Personal Care Assistance Level of Assistance  Bathing, Feeding, Dressing Bathing  Assistance: Maximum assistance Feeding assistance: Limited assistance Dressing Assistance: Maximum assistance     Functional Limitations Info  Sight, Hearing, Speech Sight Info: Adequate Hearing Info: Adequate Speech Info: Adequate    SPECIAL CARE FACTORS FREQUENCY  PT (By licensed PT), OT (By licensed OT)     PT Frequency: 3x wk OT Frequency: 3x wk            Contractures Contractures Info: Not present    Additional Factors Info  Code Status Code Status Info: DNR             Current Medications (03/11/2017):  This is the current hospital active medication list Current Facility-Administered Medications  Medication Dose Route Frequency Provider Last Rate Last Dose  . 0.9 %  sodium chloride infusion  500 mL Intravenous Once PRN Rhyne, Samantha J, PA-C      . 0.9 %  sodium chloride infusion   Intravenous Continuous Xzandria Clevinger M, PA-C      . acetaminophen (TYLENOL) tablet 325-650 mg  325-650 mg Oral Q4H PRN Rhyne, Samantha J, PA-C       Or  . acetaminophen (TYLENOL) suppository 325-650 mg  325-650 mg Rectal Q4H PRN Rhyne, Samantha J, PA-C      . atorvastatin (LIPITOR) tablet 40 mg  40 mg Oral QPM Rhyne, Samantha J, PA-C   40 mg at 03/10/17 1801  . bisacodyl (DULCOLAX) suppository 10 mg  10 mg Rectal Daily PRN Rhyne, Ames Coupe, PA-C      .  diphenhydrAMINE (BENADRYL) capsule 25 mg  25 mg Oral TID PRN Rhyne, Samantha J, PA-C      . docusate sodium (COLACE) capsule 100 mg  100 mg Oral Daily Rhyne, Samantha J, PA-C   100 mg at 03/11/17 1011  . enoxaparin (LOVENOX) injection 40 mg  40 mg Subcutaneous Q24H Armandina StammerBatchelder, Nathan J, RPH   40 mg at 03/10/17 1801  . feeding supplement (ENSURE ENLIVE) (ENSURE ENLIVE) liquid 237 mL  237 mL Oral TID WC Sherren KernsFields, Charles E, MD   237 mL at 03/11/17 0800  . fenofibrate tablet 160 mg  160 mg Oral Daily Rhyne, Samantha J, PA-C   160 mg at 03/11/17 1011  . guaiFENesin-dextromethorphan (ROBITUSSIN DM) 100-10 MG/5ML syrup 15 mL  15 mL Oral Q4H  PRN Rhyne, Samantha J, PA-C      . hydrALAZINE (APRESOLINE) injection 5 mg  5 mg Intravenous Q20 Min PRN Rhyne, Samantha J, PA-C      . hydrochlorothiazide (MICROZIDE) capsule 12.5 mg  12.5 mg Oral Daily Rhyne, Samantha J, PA-C   12.5 mg at 03/11/17 1011  . labetalol (NORMODYNE,TRANDATE) injection 10 mg  10 mg Intravenous Q10 min PRN Rhyne, Samantha J, PA-C      . magnesium sulfate IVPB 2 g 50 mL  2 g Intravenous Daily PRN Rhyne, Samantha J, PA-C      . metoCLOPramide (REGLAN) tablet 10 mg  10 mg Oral Q8H PRN Rhyne, Samantha J, PA-C      . metoprolol (LOPRESSOR) injection 2-5 mg  2-5 mg Intravenous Q2H PRN Rhyne, Samantha J, PA-C      . mirtazapine (REMERON) tablet 15 mg  15 mg Oral QHS Rhyne, Samantha J, PA-C   15 mg at 03/10/17 2202  . morphine 4 MG/ML injection 2 mg  2 mg Intravenous Q2H PRN Rhyne, Samantha J, PA-C      . ondansetron (ZOFRAN) injection 4 mg  4 mg Intravenous Q6H PRN Rhyne, Samantha J, PA-C   4 mg at 03/07/17 1432  . oxyCODONE-acetaminophen (PERCOCET/ROXICET) 5-325 MG per tablet 1-2 tablet  1-2 tablet Oral Q6H PRN Dara LordsRhyne, Samantha J, PA-C   1 tablet at 03/08/17 0609  . pantoprazole (PROTONIX) EC tablet 40 mg  40 mg Oral Daily Rhyne, Samantha J, PA-C   40 mg at 03/11/17 1011  . phenol (CHLORASEPTIC) mouth spray 1 spray  1 spray Mouth/Throat PRN Rhyne, Samantha J, PA-C      . polyethylene glycol (MIRALAX / GLYCOLAX) packet 17 g  17 g Oral Daily PRN Rhyne, Samantha J, PA-C      . potassium chloride SA (K-DUR,KLOR-CON) CR tablet 20-40 mEq  20-40 mEq Oral Daily PRN Rhyne, Ames CoupeSamantha J, PA-C         Discharge Medications: Please see discharge summary for a list of discharge medications.  Relevant Imaging Results:  Relevant Lab Results:   Additional Information SS#[id number]  Althea CharonAshley C Woods, LCSW

## 2017-03-11 NOTE — Telephone Encounter (Signed)
spoke to the aunt for appt dates and time, mailed letter to verified home address 03/31/17

## 2017-03-11 NOTE — Progress Notes (Addendum)
[facility]  Subjective  - Coughing a lot today, congested.   Objective 98/64 88 99.5 F (37.5 C) (Oral) 18 95%  Intake/Output Summary (Last 24 hours) at 03/11/17 0741 Last data filed at 03/11/17 [id number]  Gross per 24 hour  Intake              220 ml  Output              450 ml  Net             -230 ml    Lungs non labored breathing, rhonchi upper air way Palpable axillary graft Doppler right PT, left DP/PT signals Incisions soft clean and dry  Assessment/Planning:   51 y.o. male is s/p:  Right axillary bifemoral bypass grafting  4 Days Post-Op Will order cbc and bmet today to check baseline  Plan for ALF and home health  Clinton GallantCOLLINS, EMMA [facility] 03/11/2017 7:41 AM -- Pain controlled patent graft Pt to return to his assisted living today Follow up 2-3 weeks  Fabienne Brunsharles Fields, MD [facility] [place] Office: [phone] Pager: [phone]  Laboratory Lab Results: No results for input(s): WBC, HGB, HCT, PLT in the last 72 hours. BMET No results for input(s): NA, K, CL, CO2, GLUCOSE, BUN, CREATININE, CALCIUM in the last 72 hours.  COAG Lab Results  Component Value Date   INR 0.99 03/04/2017   INR 0.88 SLIGHT HEMOLYSIS 05/01/2010   No results found for: PTT

## 2017-03-11 NOTE — Progress Notes (Signed)
Clinical Social Worker facilitated patient discharge including contacting patient family and facility to confirm patient discharge plans.  Clinical information faxed to facility and family agreeable with plan.  CSW arranged ambulance transport via [facility] [place].  RN Katrina to call [phone] report prior to discharge.  Clinical Social Worker will sign off for now as social work intervention is no longer needed. Please consult us again if new need arises.  Marrianne MoodAshley Kentrell Guettler, MSW, [employer]CSWA [phone]

## 2017-03-11 NOTE — Care Management Note (Signed)
Case Management Note [name] RN, BSN Unit 2W-Case Manager [phone]  Patient Details  Name: Malik RoRobert C [name] MRN: [id number] Date of Birth: [birth year]  Subjective/Objective:   Pt admitted with PAD s/p- right axillary bifemoral bypass                  Action/Plan: PTA pt lived in an ALF-(hx of muscular dystrophy) per pt's aunt want SNF for discharge- CSW consulted for placement needs-   Expected Discharge Date:  03/11/17               Expected Discharge Plan:  Skilled Nursing Facility  In-House Referral:  Clinical Social Work  Discharge planning Services  CM Consult  Post Acute Care Choice:  Home Health Choice offered to:  NA  DME Arranged:    DME Agency:     HH Arranged:  PT, OT HH Agency:  [facility]  Status of Service:  Completed, signed off  If discussed at [employer] Length of Stay Meetings, dates discussed:    Discharge Disposition: ALF with home Health services -[facility]  Additional Comments:  03/11/17- 1345- Donn PieriniKristi [name] RN, CM- pt for d/c today- CIR had been consulted however pt does not want to go to CIR or SNF for rehab- pt [facility]- per CSW - [facility]- orders for HHPT/OT placed- call made to [place] with [facility] for referral- [facility]- referral made for HHPT/OT-  CSW following for return to ALF.   [name], [name], RN 03/11/2017, 1:44 PM

## 2017-03-11 NOTE — Progress Notes (Signed)
Pt. Discharged to previous ALF    Pt. D/C'd via [facility] Discharge information reviewed and given All personal belongings given to Pt.  Education discussed IV was d/c Tele d/c

## 2017-03-11 NOTE — Progress Notes (Signed)
Pt hadn't voided throughout the night. Bladder scan revealed 432. With mutiple verbal prompting pt voided 375. Will continue to monitor. Gregor HamsAlisha Jarell Mcewen, RN

## 2017-03-11 NOTE — Telephone Encounter (Signed)
-----   Message from Sharee PimpleMarilyn K McChesney, RN sent at 03/11/2017 10:21 AM EDT ----- Regarding: 2-3 weeks   ----- Message ----- From: Lars Mageollins, Emma M, PA-C Sent: 03/11/2017  10:04 AM To: Vvs Charge Pool  F/U with Dr. Darrick PennaFields in 2-3 weeks s/p ax bifem by pass.  No labs

## 2017-03-11 NOTE — Discharge Summary (Addendum)
[facility] Discharge Summary   Patient ID:  Malik [name] MRN: [id number] DOB/AGE: 1966-04-02 51 y.o.  Admit date: 03/07/2017 Discharge date: 03/11/2017 Date of Surgery: 03/07/2017 Surgeon: Surgeon(s): Fields, Janetta Horaharles E, MD Fransisco Hertzhen, Brian L, MD  Admission Diagnosis: Peripheral Arterial Disease  I70.92  Discharge Diagnoses:  Peripheral Arterial Disease  I70.92  Secondary Diagnoses: Past Medical History:  Diagnosis Date  . DVT (deep venous thrombosis) (HCC)    left brachial vein DVT 01/29/16  . Hiccups   . High blood pressure   . History of kidney stones   . Hypertension   . 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HCC)     Procedure(s): RIGHT AXILLA-BIFEMORAL BYPASS GRAFT USING HEMASHIELD GOLD VASCULAR GRAFT ENDARTERECTOMY LEFT FEMORAL ARTERY  PATCH ANGIOPLASTY LEFT FEMORAL ARTERY USING XENOSURE BIOLOGIC PATCH  Discharged Condition: stable  HPI: Malik MaduroRobert Sheltonis a 50 y.o.male, with a 5 year history of lower extremity weakness secondary to muscular dystrophy. He was previously seen about 5 months ago and noted to have ABIs of 0.6 bilaterally. His symptoms did not seem to be consistent with peripheral arterial disease. This seemed more related to his muscular dystrophy. He is now no longer able to even stand to transfer. He currently smokes about a half a pack of cigarettes per day. He denies any rest pain in his feet. He has no nonhealing wounds. He returns today after recent CT angiogram for further evaluation of his lower extremity arterial tree. He is nonambulatory but does use his legs for transferring.  He denies history of diabetes. He did previously have a upper extremity DVT and was on Xarelto but currently is not requiring this. He does not ambulate enough to elicit claudication symptoms. He denies rest pain. He has no tissue loss. Other medical problems include hypertension which has been controlled.  DATA: The patient had bilateral ABIs performed on  06/25/2016. ABIs were 0.6 bilaterally.  The patient had repeat ABIs March 2018 which were 0.39 bilaterally  I reviewed the images and the patient's CT angiogram today. 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 The right popliteal artery is occluded. There is one-vessel runoff via the posterior tibial artery.  In the left leg the left common iliac artery is occluded. The left superficial femoral artery is occluded. There is two-vessel runoff via the anterior and posterior tibial artery.   Hospital Course:  Malik [name] is a 51 y.o. male is S/P  Procedure(s): RIGHT AXILLA-BIFEMORAL BYPASS GRAFT USING HEMASHIELD GOLD VASCULAR GRAFT ENDARTERECTOMY LEFT FEMORAL ARTERY  PATCH ANGIOPLASTY LEFT FEMORAL ARTERY USING XENOSURE BIOLOGIC PATCH Doppler PT right, left AT/PT Groin incisions soft clean and dry Right  Subclavian incision healing well Palpable pulse axillary right lateral trunk   Assessment/Planning: POD # 2 Right axillary bifemoral bypass patent graft   ABI 0.7 bilaterally improved from 0.4 bilat preop Short bought of urine retention, UO improved  Plan discharge with home health and ALF.  Significant Diagnostic Studies: CBC Lab Results  Component Value Date   WBC 7.7 03/08/2017   HGB 13.7 03/08/2017   HCT 43.1 03/08/2017   MCV 95.4 03/08/2017   PLT 160 03/08/2017    BMET    Component Value Date/Time   NA 139 03/08/2017 0336   K 3.9 03/08/2017 0336   CL 104 03/08/2017 0336   CO2 25 03/08/2017 0336   GLUCOSE 90 03/08/2017 0336   BUN 8 03/08/2017 0336   CREATININE 0.70 03/08/2017 0336   CREATININE 0.87 02/12/2016 1133  CALCIUM 8.9 03/08/2017 0336   GFRNONAA >60 03/08/2017 0336   GFRNONAA >89 02/12/2016 1133   GFRAA >60 03/08/2017 0336   GFRAA >89 02/12/2016 1133   COAG Lab Results  Component Value Date   INR 0.99 03/04/2017   INR 0.88 SLIGHT HEMOLYSIS 05/01/2010     Disposition:  Discharge to :Rehab  Scheduled Meds: .  atorvastatin  40 mg Oral QPM  . docusate sodium  100 mg Oral Daily  . enoxaparin (LOVENOX) injection  40 mg Subcutaneous Q24H  . feeding supplement (ENSURE ENLIVE)  237 mL Oral TID WC  . fenofibrate  160 mg Oral Daily  . hydrochlorothiazide  12.5 mg Oral Daily  . mirtazapine  15 mg Oral QHS  . pantoprazole  40 mg Oral Daily   Percocet 1 q 4 PRN pain  Verbal and written Discharge instructions given to the patient. Wound care per Discharge AVS   Signed: Clinton Gallant [facility] 03/11/2017, 7:13 AM - For VQI Registry use --- Instructions: Press F2 to tab through selections.  Delete question if not applicable.   Post-op:  Time to Extubation: [x ] In OR, [ ]  <12 hrs, [ ]  12-24 hrs, [ ]  > 24 hrs Vasopressors: No  ICU Stay: 0 days  Transfusion: No  If yes, 0 units given New Arrhythmia: No Ipsilateral amputation: [x ] no, [ ]  Minor, [ ]  BKA, [ ]  AKA Discharge patency: x[ ]  Primary, [ ]  Primary assisted, [ ]  Secondary, [ ]  Occluded Patency judged by: [ ]  Dopper only, [ ]  Palpable graft pulse, [ ]  Palpable distal pulse, [ ]  ABI inc. > 0.15, [ ]  Duplex Discharge ABI: R 0.72, L 0.78  D/C Ambulatory Status: Wheelchair  Complications: Wound complication: [x ] No, [ ]  Superficial, [ ]  Return to OR  Graft infection: No  Leg ischemia/emboli: [x ] No, [ ]  Yes, no Surgery, [ ]  Yes, Surgery req., [ ]  Amputation If amputation: side: [ ]  R: [ ]  minor, [ ]  BKA, [ ]  AKA; [ ]  L: [ ]  Minor, [ ]  BKA, [ ]  AKA  MI: [x ] No, [ ]  Troponin only, [ ]  EKG or Clinical CHF: No Resp failure: [ ]  none, [ ]  Pneumonia, [ ]  Ventilator Chg in renal function: [ ]  none, [ ]  Inc. Cr > 0.5, [ ]  Temp. Dialysis, [ ]  Permanent dialysis Stroke: [ ]  None, [ ]  Minor, [ ]  Major Return to OR: No  Reason for return to OR: [ ]  Bleeding, [ ]  Infection, [ ]  Thrombosis, [ ]  Revision  Discharge medications: Statin use:  Yes ASA use:  No  for medical reason   Plavix use:  No  for medical reason   Beta blocker use: No  for medical  reason   Coumadin use: No  for medical reason

## 2017-03-15 LAB — BLOOD GAS, ARTERIAL

## 2017-03-17 ENCOUNTER — Telehealth: Payer: Self-pay | Admitting: *Deleted

## 2017-03-17 ENCOUNTER — Emergency Department (HOSPITAL_COMMUNITY)
Admission: EM | Admit: 2017-03-17 | Discharge: 2017-03-17 | Disposition: A | Discharge status: Home / Self Care | Payer: Medicaid Other | Attending: Emergency Medicine | Admitting: Emergency Medicine

## 2017-03-17 ENCOUNTER — Encounter (HOSPITAL_COMMUNITY): Payer: Self-pay | Admitting: *Deleted

## 2017-03-17 DIAGNOSIS — R109 Unspecified abdominal pain: Secondary | ICD-10-CM | POA: Insufficient documentation

## 2017-03-17 DIAGNOSIS — Z79899 Other long term (current) drug therapy: Secondary | ICD-10-CM | POA: Diagnosis not present

## 2017-03-17 DIAGNOSIS — Z87891 Personal history of nicotine dependence: Secondary | ICD-10-CM | POA: Insufficient documentation

## 2017-03-17 DIAGNOSIS — I10 Essential (primary) hypertension: Secondary | ICD-10-CM | POA: Insufficient documentation

## 2017-03-17 DIAGNOSIS — L0390 Cellulitis, unspecified: Secondary | ICD-10-CM | POA: Diagnosis present

## 2017-03-17 LAB — CBC WITH DIFFERENTIAL/PLATELET
Basophils Absolute: 0 10*3/uL (ref 0.0–0.1)
Basophils Relative: 0 %
Eosinophils Absolute: 0.2 10*3/uL (ref 0.0–0.7)
Eosinophils Relative: 3 %
HCT: 40 % (ref 39.0–52.0)
Hemoglobin: 13.2 g/dL (ref 13.0–17.0)
Lymphocytes Relative: 24 %
Lymphs Abs: 1.9 10*3/uL (ref 0.7–4.0)
MCH: 30.6 pg (ref 26.0–34.0)
MCHC: 33 g/dL (ref 30.0–36.0)
MCV: 92.6 fL (ref 78.0–100.0)
Monocytes Absolute: 0.5 10*3/uL (ref 0.1–1.0)
Monocytes Relative: 7 %
Neutro Abs: 5.1 10*3/uL (ref 1.7–7.7)
Neutrophils Relative %: 66 %
Platelets: 354 10*3/uL (ref 150–400)
RBC: 4.32 MIL/uL (ref 4.22–5.81)
RDW: 16 % — ABNORMAL HIGH (ref 11.5–15.5)
WBC: 7.7 10*3/uL (ref 4.0–10.5)

## 2017-03-17 LAB — URINALYSIS, ROUTINE W REFLEX MICROSCOPIC
Bilirubin Urine: NEGATIVE
Glucose, UA: NEGATIVE mg/dL
Hgb urine dipstick: NEGATIVE
Ketones, ur: NEGATIVE mg/dL
Leukocytes, UA: NEGATIVE
Nitrite: NEGATIVE
Protein, ur: NEGATIVE mg/dL
Specific Gravity, Urine: 1.021 (ref 1.005–1.030)
pH: 6 (ref 5.0–8.0)

## 2017-03-17 LAB — COMPREHENSIVE METABOLIC PANEL
ALT: 29 U/L (ref 17–63)
AST: 36 U/L (ref 15–41)
Albumin: 2.8 g/dL — ABNORMAL LOW (ref 3.5–5.0)
Alkaline Phosphatase: 114 U/L (ref 38–126)
Anion gap: 9 (ref 5–15)
BUN: 12 mg/dL (ref 6–20)
CO2: 29 mmol/L (ref 22–32)
Calcium: 9.3 mg/dL (ref 8.9–10.3)
Chloride: 101 mmol/L (ref 101–111)
Creatinine, Ser: 0.59 mg/dL — ABNORMAL LOW (ref 0.61–1.24)
GFR calc Af Amer: >60 mL/min (ref >60)
GFR calc non Af Amer: >60 mL/min (ref >60)
Glucose, Bld: 113 mg/dL — ABNORMAL HIGH (ref 65–99)
Potassium: 3.4 mmol/L — ABNORMAL LOW (ref 3.5–5.1)
Sodium: 139 mmol/L (ref 135–145)
Total Bilirubin: 1.1 mg/dL (ref 0.3–1.2)
Total Protein: 6.3 g/dL — ABNORMAL LOW (ref 6.5–8.1)

## 2017-03-17 MED ORDER — CEPHALEXIN 500 MG PO CAPS: 500.0000 mg | ORAL_CAPSULE | Freq: Four times a day (QID) | ORAL | 0 refills | Status: DC

## 2017-03-17 NOTE — ED Triage Notes (Signed)
[facility] Pt had a bypass surgery 10 days ago and noticed an area about 6 inches in length on the right lower quadrant that is raised, bruised and painful. No other complaints at this time.

## 2017-03-17 NOTE — ED Provider Notes (Signed)
[facility]-EMERGENCY DEPT Provider Note   CSN: [id number] Arrival date & time: 03/17/17  1251     History   Chief Complaint No chief complaint on file.   HPI Malik [name] is a 51 y.o. male.  HPI Patient presents with right flank and right abdominal pain. 10 days ago ahead right axillobifemoral bypass graft and left femoral endarterectomy. Done by Dr. Darrick Penna. He is in nursing home now. Now increasing abdominal pain. No fevers or chills. It is dull. States there is no bruising and swelling. States his legs feel okay. No nausea vomiting. No diarrhea. No dysuria.   Past Medical History:  Diagnosis Date  . DVT (deep venous thrombosis) (HCC)    left brachial vein DVT 01/29/16  . Hiccups   . High blood pressure   . History of kidney stones   . Hypertension   . Myotonic muscular dystrophy [facility])     Patient Active Problem List   Diagnosis Date Noted  . PAD (peripheral artery disease) (HCC) 06/07/2016  . Leg pain, bilateral 06/02/2016  . Esophageal obstruction due to food impaction   . DVT of upper extremity (deep vein thrombosis) (HCC) 01/29/2016  . Residence in long term care facility 03/04/2014  . EKG abnormality 11/09/2013  . Health care maintenance 06/22/2013  . Epidermal cyst 06/22/2013  . Skin lesion of hand 06/22/2013  . 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 Hypertension, benign 01/20/2012  . Unspecified vitamin D deficiency 08/05/2011  . Vitamin B 12 deficiency 07/14/2011  . 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 Muscle weakness (generalized) 04/16/2011  . Weight finding 04/16/2011  . Tobacco user 04/16/2011    Past Surgical History:  Procedure Laterality Date  . AXILLARY-FEMORAL BYPASS GRAFT Right 03/07/2017   Procedure: RIGHT AXILLA-BIFEMORAL BYPASS GRAFT USING HEMASHIELD GOLD VASCULAR GRAFT;  Surgeon: [name], MD;  Location: [facility];  Service: Vascular;  Laterality: Right;  . CHOLECYSTECTOMY    . ENDARTERECTOMY FEMORAL Left 03/07/2017   Procedure:  ENDARTERECTOMY LEFT FEMORAL ARTERY ;  Surgeon: [name], MD;  Location: [facility] OR;  Service: Vascular;  Laterality: Left;  . ESOPHAGOGASTRODUODENOSCOPY N/A 05/10/2016   Procedure: ESOPHAGOGASTRODUODENOSCOPY (EGD);  Surgeon: [name], MD;  Location: [facility];  Service: Endoscopy;  Laterality: N/A;  Please discuss sedation possibilities with Dr. [name]  . MULTIPLE TOOTH EXTRACTIONS  2007   Pt had all teeth pulled, has not yet gotten dentures.   Marland Kitchen PATCH ANGIOPLASTY Left 03/07/2017   Procedure: PATCH ANGIOPLASTY LEFT FEMORAL ARTERY USING XENOSURE BIOLOGIC PATCH;  Surgeon: [name], MD;  Location: [facility] OR;  Service: Vascular;  Laterality: Left;       Home Medications    Prior to Admission medications   Medication Sig Start Date End Date Taking? Authorizing Provider  atorvastatin (LIPITOR) 40 MG tablet Take 40 mg by mouth every evening.   Yes [provider]  diphenhydrAMINE (BENADRYL) 25 mg capsule Take 25 mg by mouth 3 (three) times daily as needed for itching.   Yes [provider]  fenofibrate (TRICOR) 145 MG tablet Take 145 mg by mouth daily.   Yes [provider]  hydrochlorothiazide (MICROZIDE) 12.5 MG capsule TAKE (1) CAPSULE BY MOUTH ONCE DAILY. Patient taking differently: Take 12.5 mg by mouth daily.  08/11/16  Yes Caryl Ada Y, DO  metoCLOPramide (REGLAN) 10 MG tablet Take 10 mg by mouth every 8 (eight) hours as needed for nausea (hiccups).   Yes [provider]  mirtazapine (REMERON) 15 MG tablet Take 15  mg by mouth at bedtime.   Yes [provider]  oxyCODONE-acetaminophen (PERCOCET/ROXICET) 5-325 MG tablet Take 1-2 tablets by mouth every 4 (four) hours as needed for moderate pain. 03/11/17  Yes Lars Mageollins, Emma M, PA-C  pantoprazole (PROTONIX) 40 MG tablet Take 40 mg by mouth daily.   Yes [provider]  traMADol-acetaminophen (ULTRACET) 37.5-325 MG tablet TAKE ONE TABLET BY MOUTH EVERY 8 HOURS AS NEEDED  MODERATE PAIN 09/06/16  Yes Caryl AdaPhelps, Jazma Y, DO  cephALEXin (KEFLEX) 500 MG capsule Take 1 capsule (500 mg total) by mouth 4 (four) times daily. 03/17/17   Benjiman CorePickering, Yoshito Gaza, MD    Family History Family History  Problem Relation Age of Onset  . COPD Mother   . Diabetes Mother   . Breast cancer Mother   . Cancer Father   . Cancer Sister   . Muscular dystrophy Sister     Social History Social History  Substance Use Topics  . Smoking status: Former Smoker    Packs/day: 0.30    Types: Cigarettes    Start date: 11/02/1983    Quit date: 02/20/2017  . Smokeless tobacco: Never Used     Comment: 4-5 cigs per day  . Alcohol use No     Allergies   Patient has no known allergies.   Review of Systems Review of Systems  Constitutional: Positive for fatigue.  HENT: Negative for congestion.   Gastrointestinal: Positive for abdominal pain.  Musculoskeletal: Negative for back pain.  Skin: Positive for wound.  Neurological: Negative for weakness and numbness.  Psychiatric/Behavioral: Negative for confusion.     Physical Exam Updated Vital Signs BP 103/79   Pulse 70   Temp 98.2 F (36.8 C) (Oral)   Resp (!) 21   SpO2 99%   Physical Exam  Constitutional: He appears well-developed.  HENT:  Head: Atraumatic.  Eyes: Pupils are equal, round, and reactive to light.  Neck: Neck supple.  Cardiovascular: Normal rate.   Pulmonary/Chest: Effort normal.  Abdominal: There is tenderness.  Some right  flank/abdominal tenderness. Some ecchymosis and mild erythema. Some mild induration. Wounds in groin and shoulder area appear to be healing well.   Musculoskeletal:  Chronic myelopathy  Neurological: He is alert.  Skin: Skin is warm. Capillary refill takes less than 2 seconds.     ED Treatments / Results  Labs (all labs ordered are listed, but only abnormal results are displayed) Labs Reviewed  COMPREHENSIVE METABOLIC PANEL - Abnormal; Notable for the following:       Result Value    Potassium 3.4 (*)    Glucose, Bld 113 (*)    Creatinine, Ser 0.59 (*)    Total Protein 6.3 (*)    Albumin 2.8 (*)    All other components within normal limits  CBC WITH DIFFERENTIAL/PLATELET - Abnormal; Notable for the following:    RDW 16.0 (*)    All other components within normal limits  URINALYSIS, ROUTINE W REFLEX MICROSCOPIC - Abnormal; Notable for the following:    Color, Urine AMBER (*)    APPearance HAZY (*)    All other components within normal limits    EKG  EKG Interpretation None       Radiology No results found.  Procedures Procedures (including critical care time)  Medications Ordered in ED Medications - No data to display   Initial Impression / Assessment and Plan / ED Course  I have reviewed the triage vital signs and the nursing notes.  Pertinent labs & imaging results that  were available during my care of the patient were reviewed by me and considered in my medical decision making (see chart for details).     Patient with right-sided abdominal pain. Around 10 days post axial bifemoral bypass. Seen in the ER by Dr. Durwin Nora. Thinks this is a superficial cellulitis. Will start Keflex. Labs reassuring. Discharge home to follow-up with Dr. Darrick Penna as needed.  Final Clinical Impressions(s) / ED Diagnoses   Final diagnoses:  Cellulitis, unspecified cellulitis site    New Prescriptions New Prescriptions   CEPHALEXIN (KEFLEX) 500 MG CAPSULE    Take 1 capsule (500 mg total) by mouth 4 (four) times daily.     Benjiman Core, MD 03/17/17 [phone]

## 2017-03-17 NOTE — ED Notes (Signed)
Got patient undress on the monitor did ekg shown to Dr pickering 

## 2017-03-17 NOTE — Consult Note (Signed)
   Patient name: Malik [name] MRN: [id number] DOB: 25-Jul-1966 Sex: male  REASON FOR CONSULT:    Some swelling along right axillofemoral bypass graft tunnel. The consult is from the emergency department.  HPI:   Malik [name] is a 51 y.o. male who underwent a right axillobifemoral bypass graft 10 days ago by Dr. Leonette Mostharles fields. He lives in a skilled nursing facility. He states that he had noticed some swelling over the lateral aspect of his right lower abdomen where his bypass graft was tunneled and presented to the emergency department. He denies any fever. He denies significant pain.  No current facility-administered medications for this encounter.    Current Outpatient Prescriptions  Medication Sig Dispense Refill  . atorvastatin (LIPITOR) 40 MG tablet Take 40 mg by mouth every evening.    . diphenhydrAMINE (BENADRYL) 25 mg capsule Take 25 mg by mouth 3 (three) times daily as needed for itching.    . fenofibrate (TRICOR) 145 MG tablet Take 145 mg by mouth daily.    . hydrochlorothiazide (MICROZIDE) 12.5 MG capsule TAKE (1) CAPSULE BY MOUTH ONCE DAILY. (Patient taking differently: Take 12.5 mg by mouth daily. ) 30 capsule 11  . metoCLOPramide (REGLAN) 10 MG tablet Take 10 mg by mouth every 8 (eight) hours as needed for nausea (hiccups).    . mirtazapine (REMERON) 15 MG tablet Take 15 mg by mouth at bedtime.    Marland Kitchen. oxyCODONE-acetaminophen (PERCOCET/ROXICET) 5-325 MG tablet Take 1-2 tablets by mouth every 4 (four) hours as needed for moderate pain. 30 tablet 0  . pantoprazole (PROTONIX) 40 MG tablet Take 40 mg by mouth daily.    . traMADol-acetaminophen (ULTRACET) 37.5-325 MG tablet TAKE ONE TABLET BY MOUTH EVERY 8 HOURS AS NEEDED MODERATE PAIN 180 tablet 1    REVIEW OF SYSTEMS:  [X]  denotes positive finding, [ ]  denotes negative finding Cardiac  Comments:  Chest pain or chest pressure:    Shortness of breath upon exertion:    Short of breath when lying flat:    Irregular heart  rhythm:    Constitutional    Fever or chills:     PHYSICAL EXAM:   Vitals:   03/17/17 1256 03/17/17 1300 03/17/17 1330 03/17/17 1345  BP:  99/80 114/82 109/86  Pulse:  67 (!) 58 60  Resp:  17 (!) 22 19  Temp:      TempSrc:      SpO2: 97% 98% 99% 98%    GENERAL: The patient is a markedly debilitated male in no acute distress.The vital signs are documented above. CARDIOVASCULAR: There is a regular rate and rhythm. PULMONARY: There is good air exchange bilaterally without wheezing or rales. He has a palpable right axillofemoral bypass graft pulse. He has good Doppler signals in both feet. There is some mild erythema in the lower lateral abdomen on the right adjacent to his bypass graft. There is no significant swelling or evidence of fluctuance. His incisions all look fine.  DATA:   White count is 7.7.  MEDICAL ISSUES:   STATUS POST RIGHT AXILLOBIFEMORAL BYPASS GRAFT: This patient has some mild cellulitis over his bypass graft in the right lower abdomen. I would recommend antibiotics and he can keep his regularly scheduled appointment with Dr. Darrick PennaFields on 03/31/17. Dr Rubin PayorPickering plans to send him back to the facility  on Keflex. The patient knows to call if he develops any increased redness or swelling.  Waverly Ferrariickson, Jenika Chiem [facility] [place] [phone]

## 2017-03-17 NOTE — Telephone Encounter (Signed)
Transfer of staff message regarding ED visit.

## 2017-03-17 NOTE — ED Notes (Signed)
Pt's family, which is at bedside, requesting update.

## 2017-03-17 NOTE — Telephone Encounter (Signed)
-----   Message from Chuck Hinthristopher S Dickson, MD sent at 03/17/2017  1:56 PM EDT ----- Regarding: charge I saw this patient in the emergency department with some mild cellulitis over his axillofemoral graft in the lower abdomen. This was not impressive and he will be sent home on po keflex.   He will keep his appointment with Dr. Darrick Pennafields on 03/31/2017.

## 2017-03-17 NOTE — ED Notes (Signed)
Pt will be leaving via [facility]. Pt is in stable condition upon d/c.

## 2017-03-18 ENCOUNTER — Encounter: Payer: Self-pay | Admitting: Vascular Surgery

## 2017-03-31 ENCOUNTER — Ambulatory Visit (INDEPENDENT_AMBULATORY_CARE_PROVIDER_SITE_OTHER): Payer: Self-pay | Admitting: Vascular Surgery

## 2017-03-31 ENCOUNTER — Encounter: Payer: Self-pay | Admitting: Vascular Surgery

## 2017-03-31 VITALS — BP 83/63 | HR 77 | Temp 97.9°F | Resp 20 | Ht 68.0 in | Wt 136.0 lb

## 2017-03-31 DIAGNOSIS — I739 Peripheral vascular disease, unspecified: Secondary | ICD-10-CM

## 2017-03-31 NOTE — Progress Notes (Signed)
Patient is a 51 year old male who returns for follow-up today after recent axillary bifemoral bypass. This was done for left leg pain. He states that he really has no rest pain in his feet and overall the soreness is improving.  Physical exam:  Vitals:   03/31/17 1409  BP: (!) 83/63  Pulse: 77  Resp: 20  Temp: 97.9 F (36.6 C)  TempSrc: Oral  SpO2: 97%  Weight: 136 lb (61.7 kg)  Height: 5\' 8"  (1.727 m)    Palpable axbifem in femorofemoral graft pulse well-healed axillary and groin incisions no erythema over the graft  Assessment: Doing well status post axillary bifemoral bypass graft.  Plan: The patient was shown how to check the pulse in his graft today and he will call us if he does not find this pulse. Otherwise he will follow-up with us with a graft duplex scan ABIs in 3 months time. He will see our nurse practitioner at that visit.  Malik Brunsharles Zhamir Pirro, MD [facility] [place] Office: [phone] Pager: [phone]

## 2017-04-06 NOTE — Addendum Note (Signed)
Addended by: Burton ApleyPETTY, Jaymond Waage A on: 04/06/2017 01:42 PM   Modules accepted: Orders

## 2017-05-09 ENCOUNTER — Telehealth: Payer: Self-pay | Admitting: Internal Medicine

## 2017-05-09 NOTE — Telephone Encounter (Signed)
Called [phone] left voice msg, in regards to refund here at office for patient.

## 2017-07-21 ENCOUNTER — Encounter: Payer: Self-pay | Admitting: Family

## 2017-07-21 ENCOUNTER — Ambulatory Visit (HOSPITAL_COMMUNITY)
Admission: RE | Admit: 2017-07-21 | Discharge: 2017-07-21 | Disposition: A | Discharge status: Home / Self Care | Payer: Medicaid Other | Source: Ambulatory Visit | Attending: Vascular Surgery | Admitting: Vascular Surgery

## 2017-07-21 ENCOUNTER — Ambulatory Visit (INDEPENDENT_AMBULATORY_CARE_PROVIDER_SITE_OTHER)
Admission: RE | Admit: 2017-07-21 | Discharge: 2017-07-21 | Disposition: A | Discharge status: Home / Self Care | Payer: Medicaid Other | Source: Ambulatory Visit | Attending: Vascular Surgery | Admitting: Vascular Surgery

## 2017-07-21 ENCOUNTER — Ambulatory Visit (INDEPENDENT_AMBULATORY_CARE_PROVIDER_SITE_OTHER): Payer: Medicaid Other | Admitting: Family

## 2017-07-21 VITALS — BP 106/79 | HR 64 | Temp 97.1°F | Resp 18 | Ht 67.0 in | Wt 118.0 lb

## 2017-07-21 DIAGNOSIS — F17200 Nicotine dependence, unspecified, uncomplicated: Secondary | ICD-10-CM

## 2017-07-21 DIAGNOSIS — I779 Disorder of arteries and arterioles, unspecified: Secondary | ICD-10-CM | POA: Diagnosis not present

## 2017-07-21 DIAGNOSIS — I739 Peripheral vascular disease, unspecified: Secondary | ICD-10-CM | POA: Insufficient documentation

## 2017-07-21 NOTE — Progress Notes (Signed)
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 Malik [name] is s/p Right axillary bifemoral bypass on 03-07-17 by Dr. Darrick Penna for rest pain in left foot.  He states that he really has no rest pain in his feet or legs.   Dr. Darrick Penna last evaluated pt on 03-31-17. At that time right flank bypass graft was palpable, well-healed axillary and groin incisions, no erythema over the graft. Doing well status post axillary bifemoral bypass graft. The patient was shown how to check the pulse in his graft that day and he will call us if he does not find this pulse. Otherwise he will follow-up with [place] with a graft duplex scan ABIs in 3 months time. He will see our nurse practitioner at that visit.  He is a resident of [facility] residential setting.   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   Pt Diabetic: No Pt smoker: current smoker, 1 pack per month, started in 1985  Pt meds include: Statin :Yes Betablocker: No ASA: No Other anticoagulants/antiplatelets: no  Past Medical History:  Diagnosis Date  . DVT (deep venous thrombosis) (HCC)    left brachial vein DVT 01/29/16  . Hiccups   . High blood pressure   . History of kidney stones   . Hypertension   . Myotonic muscular dystrophy [facility])     Social History Social History  Substance Use Topics  . Smoking status: Current Some Day Smoker    Packs/day: 0.30    Types: Cigarettes    Start date: 11/02/1983    Last attempt to quit: 02/20/2017  . Smokeless tobacco: Never Used     Comment: 4-5 cigs per day  . Alcohol use No    Family History Family History  Problem Relation Age of Onset  . COPD Mother   . Diabetes Mother   . Breast cancer Mother   . Cancer Father   . Cancer Sister   . Muscular dystrophy Sister     Past Surgical History:  Procedure Laterality Date  . AXILLARY-FEMORAL BYPASS  GRAFT Right 03/07/2017   Procedure: RIGHT AXILLA-BIFEMORAL BYPASS GRAFT USING HEMASHIELD GOLD VASCULAR GRAFT;  Surgeon: [name], MD;  Location: [facility] OR;  Service: Vascular;  Laterality: Right;  . CHOLECYSTECTOMY    . ENDARTERECTOMY FEMORAL Left 03/07/2017   Procedure: ENDARTERECTOMY LEFT FEMORAL ARTERY ;  Surgeon: [name], MD;  Location[facility] OR;  Service: Vascular;  Laterality: Left;  . ESOPHAGOGASTRODUODENOSCOPY N/A 05/10/2016   Procedure: ESOPHAGOGASTRODUODENOSCOPY (EGD);  Surgeon: [name], MD;  Location: [facility];  Service: Endoscopy;  Laterality: N/A;  Please discuss sedation possibilities with Dr. [name]  . MULTIPLE TOOTH EXTRACTIONS  2007   Pt had all teeth pulled, has not yet gotten dentures.   Marland Kitchen PATCH ANGIOPLASTY Left 03/07/2017   Procedure: PATCH ANGIOPLASTY LEFT FEMORAL ARTERY USING XENOSURE BIOLOGIC PATCH;  Surgeon: [name], MD;  Location: [facility] OR;  Service: Vascular;  Laterality: Left;    No Known Allergies  Current Outpatient Prescriptions  Medication Sig Dispense Refill  . atorvastatin (LIPITOR) 40 MG tablet Take 40 mg by mouth every evening.    . cephALEXin (KEFLEX) 500 MG capsule Take 1 capsule (500 mg total) by mouth 4 (four) times daily. 35 capsule 0  . diphenhydrAMINE (BENADRYL) 25 mg capsule Take 25 mg by mouth 3 (three) times daily as  needed for itching.    . fenofibrate (TRICOR) 145 MG tablet Take 145 mg by mouth daily.    . hydrochlorothiazide (MICROZIDE) 12.5 MG capsule TAKE (1) CAPSULE BY MOUTH ONCE DAILY. (Patient taking differently: Take 12.5 mg by mouth daily. ) 30 capsule 11  . metoCLOPramide (REGLAN) 10 MG tablet Take 10 mg by mouth every 8 (eight) hours as needed for nausea (hiccups).    . mirtazapine (REMERON) 15 MG tablet Take 15 mg by mouth at bedtime.    Marland Kitchen oxyCODONE-acetaminophen (PERCOCET/ROXICET) 5-325 MG tablet Take 1-2 tablets by mouth every 4 (four) hours as needed for moderate pain. 30 tablet 0  . pantoprazole (PROTONIX)  40 MG tablet Take 40 mg by mouth daily.    . traMADol-acetaminophen (ULTRACET) 37.5-325 MG tablet TAKE ONE TABLET BY MOUTH EVERY 8 HOURS AS NEEDED MODERATE PAIN 180 tablet 1   No current facility-administered medications for this visit.     ROS: See HPI for pertinent positives and negatives.   Physical Examination  Vitals:   07/21/17 1412  BP: 106/79  Pulse: 64  Resp: 18  Temp: (!) 97.1 F (36.2 C)  TempSrc: Oral  SpO2: 100%  Weight: 118 lb (53.5 kg)  Height:  (1.702 m)   Body mass index is 18.48 kg/m.  General: A&O x 3, WDWN, thin male. Gait: seated in w/c Eyes: PERRLA. Pulmonary: Respirations are non labored, CTAB, fair air movement Cardiac: regular Rhythm, no detected murmur.         Carotid Bruits Right Left   Negative Negative   Radial pulses are faintly palpable bilaterally. Fingers of both hands are pink with brisk capillary refill.  Adominal aortic pulse is not palpable . Right flank bypass graft pulse is palpable.                        VASCULAR EXAM: Extremities without ischemic changes,  without Gangrene; without open wounds.                                                                                                          LE Pulses Right Left       FEMORAL  not palpable, seated in w/c  not palpable        POPLITEAL  not palpable   not palpable       POSTERIOR TIBIAL  not palpable   not palpable        DORSALIS PEDIS      ANTERIOR TIBIAL not palpable  not palpable    Abdomen: soft, NT, no palpable masses. Skin: no rashes, no ulcers noted. Musculoskeletal: + muscle wasting or atrophy in arms and legs  Neurologic: A&O X 3; Appropriate Affect ; SENSATION: normal; MOTOR FUNCTION:  moving all extremities equally, motor strength 3/5 in right UE, 2/5 in left UE, and 4/5 in legs. Speech is fluent/normal. CN 2-12 intact.    ASSESSMENT: Malik [name] is a 51 y.o. male who is s/p Right axillary bifemoral bypass on 03-07-17. Right flank  bypass graft pulse is  palpable.   He denies rest pain in his lower extremities. 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 He has muscle wasting in his arms and legs.   His atherosclerotic risk factors include current smoker, but at a reduced rate of 1 pack/month. Fortunately he does not have DM.  He takes a daily statin, but no antiplatelet agent.  Pt denies any bleeding problems, denies hx of GI ulcers or bleeding, denies nose bleed hx, denies allergy to ASA. Advise daily 81 mg ASA to reduce his risk of CVD event; defer to his PCP.   DATA  Right axillary bifemoral bypass duplex (07/21/17): Limited exam. Widely patent right axillary-bifemoral bypass graft without evidence of stenosis.   ABI (Date: 07/21/2017):  R:   ABI: 0.71 (was 0.72 on 03-08-17),   PT: bi  DP: bi  TBI:  0.54  L:   ABI: 0.68 (was 0.78),   PT: mono  DP: mono  TBI: 0.49  Stable on the right, mild decline in the left ABI; moderate disease in bilateral lower extremities.     PLAN:  Based on the patient's vascular studies and examination, pt will return to clinic in 3 months with right Ax-fem bypass graft duplex and ABI's. I advised pt to notify us if he develops concerns re the circulation in his feet or legs.   I discussed in depth with the patient the nature of atherosclerosis, and emphasized the importance of maximal medical management including strict control of blood pressure, blood glucose, and lipid levels, obtaining regular exercise, and cessation of smoking.  The patient is aware that without maximal medical management the underlying atherosclerotic disease process will progress, limiting the benefit of any interventions.  The patient was given information about PAD including signs, symptoms, treatment, what symptoms should prompt the patient to seek immediate medical care, and risk reduction measures to take.  Charisse March, RN, MSN, FNP-C [facility]  [place] Phone: [phone]  Clinic MD: Darrick Penna  07/21/17 3:00 PM

## 2017-07-21 NOTE — Patient Instructions (Addendum)
Steps to Quit Smoking Smoking tobacco can be bad for your health. It can also affect almost every organ in your body. Smoking puts you and people around you at risk for many serious long-lasting (chronic) diseases. Quitting smoking is hard, but it is one of the best things that you can do for your health. It is never too late to quit. What are the benefits of quitting smoking? When you quit smoking, you lower your risk for getting serious diseases and conditions. They can include:  Lung cancer or lung disease.  Heart disease.  Stroke.  Heart attack.  Not being able to have children (infertility).  Weak bones (osteoporosis) and broken bones (fractures).  If you have coughing, wheezing, and shortness of breath, those symptoms may get better when you quit. You may also get sick less often. If you are pregnant, quitting smoking can help to lower your chances of having a baby of low birth weight. What can I do to help me quit smoking? Talk with your doctor about what can help you quit smoking. Some things you can do (strategies) include:  Quitting smoking totally, instead of slowly cutting back how much you smoke over a period of time.  Going to in-person counseling. You are more likely to quit if you go to many counseling sessions.  Using resources and support systems, such as: ? Online chats with a counselor. ? Phone quitlines. ? Printed self-help materials. ? Support groups or group counseling. ? Text messaging programs. ? Mobile phone apps or applications.  Taking medicines. Some of these medicines may have nicotine in them. If you are pregnant or breastfeeding, do not take any medicines to quit smoking unless your doctor says it is okay. Talk with your doctor about counseling or other things that can help you.  Talk with your doctor about using more than one strategy at the same time, such as taking medicines while you are also going to in-person counseling. This can help make  quitting easier. What things can I do to make it easier to quit? Quitting smoking might feel very hard at first, but there is a lot that you can do to make it easier. Take these steps:  Talk to your family and friends. Ask them to support and encourage you.  Call phone quitlines, reach out to support groups, or work with a counselor.  Ask people who smoke to not smoke around you.  Avoid places that make you want (trigger) to smoke, such as: ? Bars. ? Parties. ? Smoke-break areas at work.  Spend time with people who do not smoke.  Lower the stress in your life. Stress can make you want to smoke. Try these things to help your stress: ? Getting regular exercise. ? Deep-breathing exercises. ? Yoga. ? Meditating. ? Doing a body scan. To do this, close your eyes, focus on one area of your body at a time from head to toe, and notice which parts of your body are tense. Try to relax the muscles in those areas.  Download or buy apps on your mobile phone or tablet that can help you stick to your quit plan. There are many free apps, such as QuitGuide from the CDC (Centers for Disease Control and Prevention). You can find more support from smokefree.gov and other websites.  This information is not intended to replace advice given to you by your health care provider. Make sure you discuss any questions you have with your health care provider. Document Released: 08/14/2009 Document   Revised: 06/15/2016 Document Reviewed: 03/04/2015 Elsevier Interactive Patient Education  2018 Elsevier Inc.     Peripheral Vascular Disease Peripheral vascular disease (PVD) is a disease of the blood vessels that are not part of your heart and brain. A simple term for PVD is poor circulation. In most cases, PVD narrows the blood vessels that carry blood from your heart to the rest of your body. This can result in a decreased supply of blood to your arms, legs, and internal organs, like your stomach or kidneys.  However, it most often affects a person's lower legs and feet. There are two types of PVD.  Organic PVD. This is the more common type. It is caused by damage to the structure of blood vessels.  Functional PVD. This is caused by conditions that make blood vessels contract and tighten (spasm).  Without treatment, PVD tends to get worse over time. PVD can also lead to acute ischemic limb. This is when an arm or limb suddenly has trouble getting enough blood. This is a medical emergency. Follow these instructions at home:  Take medicines only as told by your doctor.  Do not use any tobacco products, including cigarettes, chewing tobacco, or electronic cigarettes. If you need help quitting, ask your doctor.  Lose weight if you are overweight, and maintain a healthy weight as told by your doctor.  Eat a diet that is low in fat and cholesterol. If you need help, ask your doctor.  Exercise regularly. Ask your doctor for some good activities for you.  Take good care of your feet. ? Wear comfortable shoes that fit well. ? Check your feet often for any cuts or sores. Contact a doctor if:  You have cramps in your legs while walking.  You have leg pain when you are at rest.  You have coldness in a leg or foot.  Your skin changes.  You are unable to get or have an erection (erectile dysfunction).  You have cuts or sores on your feet that are not healing. Get help right away if:  Your arm or leg turns cold and blue.  Your arms or legs become red, warm, swollen, painful, or numb.  You have chest pain or trouble breathing.  You suddenly have weakness in your face, arm, or leg.  You become very confused or you cannot speak.  You suddenly have a very bad headache.  You suddenly cannot see. This information is not intended to replace advice given to you by your health care provider. Make sure you discuss any questions you have with your health care provider. Document Released:  01/12/2010 Document Revised: 03/25/2016 Document Reviewed: 03/28/2014 Elsevier Interactive Patient Education  2017 Elsevier Inc.  

## 2017-07-25 NOTE — Addendum Note (Signed)
Addended by: Burton Apley A on: 07/25/2017 11:11 AM   Modules accepted: Orders

## 2017-08-29 ENCOUNTER — Encounter (HOSPITAL_COMMUNITY): Payer: Self-pay

## 2017-08-29 ENCOUNTER — Encounter (HOSPITAL_COMMUNITY)
Admission: EM | Disposition: A | Discharge status: Skilled Nursing Facility | Payer: Self-pay | Source: Home / Self Care | Attending: Emergency Medicine

## 2017-08-29 ENCOUNTER — Emergency Department (HOSPITAL_COMMUNITY): Payer: Medicaid Other

## 2017-08-29 ENCOUNTER — Ambulatory Visit (HOSPITAL_COMMUNITY)
Admission: EM | Admit: 2017-08-29 | Discharge: 2017-08-30 | Disposition: A | Discharge status: Skilled Nursing Facility | Payer: Medicaid Other | Attending: Emergency Medicine | Admitting: Emergency Medicine

## 2017-08-29 DIAGNOSIS — X58XXXA Exposure to other specified factors, initial encounter: Secondary | ICD-10-CM | POA: Insufficient documentation

## 2017-08-29 DIAGNOSIS — R10816 Epigastric abdominal tenderness: Secondary | ICD-10-CM | POA: Diagnosis not present

## 2017-08-29 DIAGNOSIS — R1310 Dysphagia, unspecified: Secondary | ICD-10-CM | POA: Diagnosis present

## 2017-08-29 DIAGNOSIS — K2970 Gastritis, unspecified, without bleeding: Secondary | ICD-10-CM | POA: Diagnosis not present

## 2017-08-29 DIAGNOSIS — Z87442 Personal history of urinary calculi: Secondary | ICD-10-CM | POA: Diagnosis not present

## 2017-08-29 DIAGNOSIS — K222 Esophageal obstruction: Secondary | ICD-10-CM

## 2017-08-29 DIAGNOSIS — Z9049 Acquired absence of other specified parts of digestive tract: Secondary | ICD-10-CM | POA: Insufficient documentation

## 2017-08-29 DIAGNOSIS — G7111 Myotonic muscular dystrophy: Secondary | ICD-10-CM | POA: Insufficient documentation

## 2017-08-29 DIAGNOSIS — K219 Gastro-esophageal reflux disease without esophagitis: Secondary | ICD-10-CM | POA: Diagnosis present

## 2017-08-29 DIAGNOSIS — Z820 Family history of epilepsy and other diseases of the nervous system: Secondary | ICD-10-CM | POA: Insufficient documentation

## 2017-08-29 DIAGNOSIS — I10 Essential (primary) hypertension: Secondary | ICD-10-CM | POA: Diagnosis not present

## 2017-08-29 DIAGNOSIS — Z86718 Personal history of other venous thrombosis and embolism: Secondary | ICD-10-CM | POA: Diagnosis not present

## 2017-08-29 DIAGNOSIS — F17210 Nicotine dependence, cigarettes, uncomplicated: Secondary | ICD-10-CM | POA: Insufficient documentation

## 2017-08-29 DIAGNOSIS — T18128A Food in esophagus causing other injury, initial encounter: Secondary | ICD-10-CM | POA: Insufficient documentation

## 2017-08-29 DIAGNOSIS — Z803 Family history of malignant neoplasm of breast: Secondary | ICD-10-CM | POA: Insufficient documentation

## 2017-08-29 DIAGNOSIS — Z95820 Peripheral vascular angioplasty status with implants and grafts: Secondary | ICD-10-CM | POA: Diagnosis not present

## 2017-08-29 DIAGNOSIS — Z836 Family history of other diseases of the respiratory system: Secondary | ICD-10-CM | POA: Insufficient documentation

## 2017-08-29 DIAGNOSIS — Z833 Family history of diabetes mellitus: Secondary | ICD-10-CM | POA: Diagnosis not present

## 2017-08-29 HISTORY — PX: ESOPHAGOGASTRODUODENOSCOPY: SHX5428

## 2017-08-29 LAB — COMPREHENSIVE METABOLIC PANEL
ALT: 29 U/L (ref 17–63)
AST: 37 U/L (ref 15–41)
Albumin: 4.1 g/dL (ref 3.5–5.0)
Alkaline Phosphatase: 115 U/L (ref 38–126)
Anion gap: 11 (ref 5–15)
BUN: 17 mg/dL (ref 6–20)
CO2: 27 mmol/L (ref 22–32)
Calcium: 9.9 mg/dL (ref 8.9–10.3)
Chloride: 105 mmol/L (ref 101–111)
Creatinine, Ser: 0.6 mg/dL — ABNORMAL LOW (ref 0.61–1.24)
GFR calc Af Amer: >60 mL/min (ref >60)
GFR calc non Af Amer: >60 mL/min (ref >60)
Glucose, Bld: 96 mg/dL (ref 65–99)
Potassium: 3.6 mmol/L (ref 3.5–5.1)
Sodium: 143 mmol/L (ref 135–145)
Total Bilirubin: 1.6 mg/dL — ABNORMAL HIGH (ref 0.3–1.2)
Total Protein: 7.1 g/dL (ref 6.5–8.1)

## 2017-08-29 LAB — CBC
HCT: 49.5 % (ref 39.0–52.0)
Hemoglobin: 16.5 g/dL (ref 13.0–17.0)
MCH: 31.8 pg (ref 26.0–34.0)
MCHC: 33.3 g/dL (ref 30.0–36.0)
MCV: 95.4 fL (ref 78.0–100.0)
Platelets: 156 10*3/uL (ref 150–400)
RBC: 5.19 MIL/uL (ref 4.22–5.81)
RDW: 15.9 % — ABNORMAL HIGH (ref 11.5–15.5)
WBC: 5.5 10*3/uL (ref 4.0–10.5)

## 2017-08-29 LAB — LIPASE, BLOOD: Lipase: 27 U/L (ref 11–51)

## 2017-08-29 SURGERY — EGD (ESOPHAGOGASTRODUODENOSCOPY)
Anesthesia: Moderate Sedation

## 2017-08-29 MED ORDER — STERILE WATER FOR INJECTION IJ SOLN
INTRAMUSCULAR | Status: AC
  Filled 2017-08-29: qty 10

## 2017-08-29 MED ORDER — DIPHENHYDRAMINE HCL 50 MG/ML IJ SOLN
INTRAMUSCULAR | Status: AC
  Filled 2017-08-29: qty 1

## 2017-08-29 MED ORDER — SODIUM CHLORIDE 0.9 % IV SOLN
INTRAVENOUS | Status: DC
  Administered 2017-08-29: 22:00:00 via INTRAVENOUS

## 2017-08-29 MED ORDER — FENTANYL CITRATE (PF) 100 MCG/2ML IJ SOLN
INTRAMUSCULAR | Status: DC | PRN
  Administered 2017-08-29 (×4): 25 ug via INTRAVENOUS

## 2017-08-29 MED ORDER — FENTANYL CITRATE (PF) 100 MCG/2ML IJ SOLN
INTRAMUSCULAR | Status: AC
  Filled 2017-08-29: qty 4

## 2017-08-29 MED ORDER — GLUCAGON HCL RDNA (DIAGNOSTIC) 1 MG IJ SOLR
1.0000 mg | Freq: Once | INTRAMUSCULAR | Status: AC
  Administered 2017-08-29: 1 mg via INTRAVENOUS
  Filled 2017-08-29: qty 1

## 2017-08-29 MED ORDER — MIDAZOLAM HCL 5 MG/ML IJ SOLN
INTRAMUSCULAR | Status: AC
  Filled 2017-08-29: qty 3

## 2017-08-29 MED ORDER — SODIUM CHLORIDE 0.9 % IV BOLUS (SEPSIS)
500.0000 mL | Freq: Once | INTRAVENOUS | Status: AC
  Administered 2017-08-29: 500 mL via INTRAVENOUS

## 2017-08-29 MED ORDER — ONDANSETRON HCL 4 MG/2ML IJ SOLN
4.0000 mg | Freq: Once | INTRAMUSCULAR | Status: AC
  Administered 2017-08-29: 4 mg via INTRAVENOUS
  Filled 2017-08-29: qty 2

## 2017-08-29 MED ORDER — MIDAZOLAM HCL 10 MG/2ML IJ SOLN
INTRAMUSCULAR | Status: DC | PRN
  Administered 2017-08-29 (×5): 1 mg via INTRAVENOUS

## 2017-08-29 NOTE — Consult Note (Signed)
Reason for Consult: Food impaction. Referring Physician: ED MD-Dr. April Plunkett.  Malik [name] is an 51 y.o. male.  HPI: 51 year old white male, with multiple medical issues listed below, in the ED for a recurrent meat impaction. He has a history of reflux and is on Pantoprazoe for it. He has had this happen once before. He complains of mild epigastric tenderness. He has not been able to swallow his secretions since lunch time after the meat impaction. . Past Medical History:  Diagnosis Date  . DVT (deep venous thrombosis) (HCC)    left brachial vein DVT 01/29/16  . Hiccups   . High blood pressure   . History of kidney stones   . Hypertension   . Myotonic muscular dystrophy [facility])    Past Surgical History:  Procedure Laterality Date  . AXILLARY-FEMORAL BYPASS GRAFT Right 03/07/2017   Procedure: RIGHT AXILLA-BIFEMORAL BYPASS GRAFT USING HEMASHIELD GOLD VASCULAR GRAFT;  Surgeon: Elam Dutch, MD;  Location: [place];  Service: Vascular;  Laterality: Right;  . CHOLECYSTECTOMY    . ENDARTERECTOMY FEMORAL Left 03/07/2017   Procedure: ENDARTERECTOMY LEFT FEMORAL ARTERY ;  Surgeon: Elam Dutch, MD;  Location: [facility] OR;  Service: Vascular;  Laterality: Left;  . ESOPHAGOGASTRODUODENOSCOPY N/A 05/10/2016   Procedure: ESOPHAGOGASTRODUODENOSCOPY (EGD);  Surgeon: Doran Stabler, MD;  Location: [facility];  Service: Endoscopy;  Laterality: N/A;  Please discuss sedation possibilities with Dr. Loletha Carrow  . MULTIPLE TOOTH EXTRACTIONS  2007   Pt had all teeth pulled, has not yet gotten dentures.   Marland Kitchen PATCH ANGIOPLASTY Left 03/07/2017   Procedure: PATCH ANGIOPLASTY LEFT FEMORAL ARTERY USING XENOSURE BIOLOGIC PATCH;  Surgeon: Elam Dutch, MD;  Location: [facility] OR;  Service: Vascular;  Laterality: Left;   Family History  Problem Relation Age of Onset  . COPD Mother   . Diabetes Mother   . Breast cancer Mother   . Cancer Father   . Cancer Sister   . Muscular dystrophy Sister    Social History:   reports that he has been smoking Cigarettes.  He started smoking about 33 years ago. He has been smoking about 0.30 packs per day. He has never used smokeless tobacco. He reports that he does not drink alcohol or use drugs.  Allergies: No Known Allergies  Medications: I have reviewed the patient's current medications.  Results for orders placed or performed during the hospital encounter of 08/29/17 (from the past 48 hour(s))  Lipase, blood     Status: None   Collection Time: 08/29/17  9:29 PM  Result Value Ref Range   Lipase 27 11 - 51 U/L  Comprehensive metabolic panel     Status: Abnormal   Collection Time: 08/29/17  9:29 PM  Result Value Ref Range   Sodium 143 135 - 145 mmol/L   Potassium 3.6 3.5 - 5.1 mmol/L   Chloride 105 101 - 111 mmol/L   CO2 27 22 - 32 mmol/L   Glucose, Bld 96 65 - 99 mg/dL   BUN 17 6 - 20 mg/dL   Creatinine, Ser 0.60 (L) 0.61 - 1.24 mg/dL   Calcium 9.9 8.9 - 10.3 mg/dL   Total Protein 7.1 6.5 - 8.1 g/dL   Albumin 4.1 3.5 - 5.0 g/dL   AST 37 15 - 41 U/L   ALT 29 17 - 63 U/L   Alkaline Phosphatase 115 38 - 126 U/L   Total Bilirubin 1.6 (H) 0.3 - 1.2 mg/dL   GFR calc non Af Amer >  60 >60 mL/min   GFR calc Af Amer >60 >60 mL/min    Comment: (NOTE) The eGFR has been calculated using the CKD EPI equation. This calculation has not been validated in all clinical situations. eGFR's persistently <60 mL/min signify possible Chronic Kidney Disease.    Anion gap 11 5 - 15  CBC     Status: Abnormal   Collection Time: 08/29/17  9:29 PM  Result Value Ref Range   WBC 5.5 4.0 - 10.5 K/uL   RBC 5.19 4.22 - 5.81 MIL/uL   Hemoglobin 16.5 13.0 - 17.0 g/dL   HCT 49.5 39.0 - 52.0 %   MCV 95.4 78.0 - 100.0 fL   MCH 31.8 26.0 - 34.0 pg   MCHC 33.3 30.0 - 36.0 g/dL   RDW 15.9 (H) 11.5 - 15.5 %   Platelets 156 150 - 400 K/uL   Dg Chest 2 View  Result Date: 08/29/2017 CLINICAL DATA:  Unable to swallow food since brunch. History of hiccups. EXAM: CHEST  2 VIEW  COMPARISON:  Chest radiograph November 23, 2016 FINDINGS: Cardiomediastinal silhouette is normal. No pleural effusions or focal consolidations. Unchanged bibasilar scarring. Trachea projects midline and there is no pneumothorax. Soft tissue planes and included osseous structures are non-suspicious. Mild degenerative change of the spine. Surgical clips in the included right abdomen compatible with cholecystectomy. IMPRESSION: Bibasilar scarring, no acute cardiopulmonary process. Electronically Signed   By: Elon Alas M.D.   On: 08/29/2017 21:59   Review of Systems  Constitutional: Negative for chills, diaphoresis, fever and malaise/fatigue.  HENT: Negative.   Eyes: Negative.   Respiratory: Negative.   Cardiovascular: Negative.   Gastrointestinal: Positive for abdominal pain, nausea and vomiting. Negative for blood in stool, constipation, diarrhea, heartburn and melena.  Genitourinary: Negative.   Musculoskeletal: Positive for joint pain and myalgias.  Skin: Negative.   Neurological: Positive for weakness.  Endo/Heme/Allergies: Negative.   Psychiatric/Behavioral: Negative.    Blood pressure 126/83, pulse 71, temperature 97.8 F (36.6 C), temperature source Oral, resp. rate 20, height _0  (1.702 m), weight 53.5 kg (118 lb), SpO2 100 %. Physical Exam  Constitutional: He is oriented to person, place, and time.  HENT:  Head: Normocephalic and atraumatic.  Eyes: Pupils are equal, round, and reactive to light. Conjunctivae and EOM are normal.  Neck: Normal range of motion. Neck supple.  Cardiovascular: Normal rate and regular rhythm.   Respiratory: Effort normal and breath sounds normal.  GI: Soft. Bowel sounds are normal.  Neurological: He is alert and oriented to person, place, and time.  Skin: Skin is warm and dry.  Psychiatric: He has a normal mood and affect. His behavior is normal. Judgment and thought content normal.   Assessment/Plan: Meat impaction: proceed with an EGD.    Kahleah Crass 08/29/2017, 11:09 PM

## 2017-08-29 NOTE — ED Provider Notes (Signed)
[facility]-EMERGENCY DEPT Provider Note   CSN: [id number] Arrival date & time: 08/29/17  1829     History   Chief Complaint Chief Complaint  Patient presents with  . Abdominal Pain    HPI Malik [name] is a 51 y.o. male.  Patient is a 51 year old male with a history of peripheral artery disease, DVT, hypertension, myotonic muscular dystrophy presenting with symptoms suggestive of a food bolus impaction.  Patient was eating chicken today and after 3 bites regurgitated up some of the food but since that time he has been unable to tolerate liquids or solids.  Patient states he has some mild upper abdominal pain but thinks it is from constantly regurgitating since noon today.  He has had some minimal shortness of breath occasionally but not chronically.  Patient states this is happened once before and they were unable to tell him why that happened.  Patient does take Protonix daily.   The history is provided by the patient.    Past Medical History:  Diagnosis Date  . DVT (deep venous thrombosis) (HCC)    left brachial vein DVT 01/29/16  . Hiccups   . High blood pressure   . History of kidney stones   . Hypertension   . Myotonic muscular dystrophy [facility])     Patient Active Problem List   Diagnosis Date Noted  . PAD (peripheral artery disease) (HCC) 06/07/2016  . Leg pain, bilateral 06/02/2016  . Esophageal obstruction due to food impaction   . DVT of upper extremity (deep vein thrombosis) (HCC) 01/29/2016  . Residence in long term care facility 03/04/2014  . EKG abnormality 11/09/2013  . Health care maintenance 06/22/2013  . Epidermal cyst 06/22/2013  . Skin lesion of hand 06/22/2013  . 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 Hypertension, benign 01/20/2012  . Unspecified vitamin D deficiency 08/05/2011  . Vitamin B 12 deficiency 07/14/2011  . 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 Muscle weakness (generalized) 04/16/2011  . Weight finding 04/16/2011    . Tobacco user 04/16/2011    Past Surgical History:  Procedure Laterality Date  . AXILLARY-FEMORAL BYPASS GRAFT Right 03/07/2017   Procedure: RIGHT AXILLA-BIFEMORAL BYPASS GRAFT USING HEMASHIELD GOLD VASCULAR GRAFT;  Surgeon: [name], MD;  Location: [facility] OR;  Service: Vascular;  Laterality: Right;  . CHOLECYSTECTOMY    . ENDARTERECTOMY FEMORAL Left 03/07/2017   Procedure: ENDARTERECTOMY LEFT FEMORAL ARTERY ;  Surgeon: [name], MD;  Location: [facility] OR;  Service: Vascular;  Laterality: Left;  . ESOPHAGOGASTRODUODENOSCOPY N/A 05/10/2016   Procedure: ESOPHAGOGASTRODUODENOSCOPY (EGD);  Surgeon: [name], MD;  Location: [facility];  Service: Endoscopy;  Laterality: N/A;  Please discuss sedation possibilities with Dr. [name]  . MULTIPLE TOOTH EXTRACTIONS  2007   Pt had all teeth pulled, has not yet gotten dentures.   Marland Kitchen PATCH ANGIOPLASTY Left 03/07/2017   Procedure: PATCH ANGIOPLASTY LEFT FEMORAL ARTERY USING XENOSURE BIOLOGIC PATCH;  Surgeon: [name], MD;  Location: [facility] OR;  Service: Vascular;  Laterality: Left;       Home Medications    Prior to Admission medications   Medication Sig Start Date End Date Taking? Authorizing Provider  atorvastatin (LIPITOR) 40 MG tablet Take 40 mg by mouth every evening.    [provider]  cephALEXin (KEFLEX) 500 MG capsule Take 1 capsule (500 mg total) by mouth 4 (four) times daily. 03/17/17   Benjiman Core, MD  diphenhydrAMINE (BENADRYL) 25 mg capsule Take 25 mg by mouth 3 (three)  times daily as needed for itching.    [provider]  fenofibrate (TRICOR) 145 MG tablet Take 145 mg by mouth daily.    [provider]  hydrochlorothiazide (MICROZIDE) 12.5 MG capsule TAKE (1) CAPSULE BY MOUTH ONCE DAILY. Patient taking differently: Take 12.5 mg by mouth daily.  08/11/16   Pincus LargePhelps, Jazma Y, DO  metoCLOPramide (REGLAN) 10 MG tablet Take 10 mg by mouth every 8 (eight) hours as needed for nausea (hiccups).     [provider]  mirtazapine (REMERON) 15 MG tablet Take 15 mg by mouth at bedtime.    [provider]  oxyCODONE-acetaminophen (PERCOCET/ROXICET) 5-325 MG tablet Take 1-2 tablets by mouth every 4 (four) hours as needed for moderate pain. 03/11/17   Lars Mageollins, Emma M, PA-C  pantoprazole (PROTONIX) 40 MG tablet Take 40 mg by mouth daily.    [provider]  traMADol-acetaminophen (ULTRACET) 37.5-325 MG tablet TAKE ONE TABLET BY MOUTH EVERY 8 HOURS AS NEEDED MODERATE PAIN 09/06/16   Pincus LargePhelps, Jazma Y, DO    Family History Family History  Problem Relation Age of Onset  . COPD Mother   . Diabetes Mother   . Breast cancer Mother   . Cancer Father   . Cancer Sister   . Muscular dystrophy Sister     Social History Social History  Substance Use Topics  . Smoking status: Current Some Day Smoker    Packs/day: 0.30    Types: Cigarettes    Start date: 11/02/1983    Last attempt to quit: 02/20/2017  . Smokeless tobacco: Never Used     Comment: 4-5 cigs per day  . Alcohol use No     Allergies   Patient has no known allergies.   Review of Systems Review of Systems  All other systems reviewed and are negative.    Physical Exam Updated Vital Signs BP (!) 117/94 (BP Location: Left Arm)   Pulse 81   Temp 97.8 F (36.6 C) (Oral)   Resp 20   Ht 5\' 7"  (1.702 m)   Wt 53.5 kg (118 lb)   BMI 18.48 kg/m   Physical Exam  Constitutional: He is oriented to person, place, and time. He appears well-developed and well-nourished. No distress.  Regurgitating saliva in the room  HENT:  Head: Normocephalic and atraumatic.  Mouth/Throat: Oropharynx is clear and moist.  Eyes: Pupils are equal, round, and reactive to light. Conjunctivae and EOM are normal.  Neck: Normal range of motion. Neck supple.  Cardiovascular: Normal rate and regular rhythm.   No murmur heard. Pulmonary/Chest: Effort normal and breath sounds normal. No respiratory distress. He has no wheezes. He has  no rales.  Abdominal: Soft. He exhibits no distension. There is no tenderness. There is no rebound and no guarding.  Musculoskeletal: Normal range of motion. He exhibits no edema or tenderness.  Ext are warm bilaterally  Neurological: He is alert and oriented to person, place, and time.  Skin: Skin is warm and dry. No rash noted. No erythema.  Psychiatric: He has a normal mood and affect. His behavior is normal.  Nursing note and vitals reviewed.    ED Treatments / Results  Labs (all labs ordered are listed, but only abnormal results are displayed) Labs Reviewed  COMPREHENSIVE METABOLIC PANEL - Abnormal; Notable for the following:       Result Value   Creatinine, Ser 0.60 (*)    Total Bilirubin 1.6 (*)    All other components within normal limits  CBC -  Abnormal; Notable for the following:    RDW 15.9 (*)    All other components within normal limits  URINALYSIS, ROUTINE W REFLEX MICROSCOPIC - Abnormal; Notable for the following:    Color, Urine AMBER (*)    APPearance HAZY (*)    Hgb urine dipstick LARGE (*)    Ketones, ur 5 (*)    Protein, ur 30 (*)    Squamous Epithelial / LPF 0-5 (*)    All other components within normal limits  LIPASE, BLOOD    EKG  EKG Interpretation  Date/Time:  Monday August 29 2017 21:50:30 EDT Ventricular Rate:  75 PR Interval:    QRS Duration: 84 QT Interval:  395 QTC Calculation: 442 R Axis:   0 Text Interpretation:  Sinus rhythm Left ventricular hypertrophy Inferolateral infarct, acute Anterior ST elevation, probably due to LVH No significant change since last tracing Confirmed by Gwyneth Sprout ([id number]) on 08/29/2017 9:53:27 PM       Radiology Dg Chest 2 View  Result Date: 08/29/2017 CLINICAL DATA:  Unable to swallow food since brunch. History of hiccups. EXAM: CHEST  2 VIEW COMPARISON:  Chest radiograph November 23, 2016 FINDINGS: Cardiomediastinal silhouette is normal. No pleural effusions or focal consolidations. Unchanged  bibasilar scarring. Trachea projects midline and there is no pneumothorax. Soft tissue planes and included osseous structures are non-suspicious. Mild degenerative change of the spine. Surgical clips in the included right abdomen compatible with cholecystectomy. IMPRESSION: Bibasilar scarring, no acute cardiopulmonary process. Electronically Signed   By: Awilda Metro M.D.   On: 08/29/2017 21:59    Procedures Procedures (including critical care time)  Medications Ordered in ED Medications  ondansetron (ZOFRAN) injection 4 mg (not administered)  glucagon (human recombinant) (GLUCAGEN) injection 1 mg (not administered)  sodium chloride 0.9 % bolus 500 mL (not administered)  0.9 %  sodium chloride infusion (not administered)     Initial Impression / Assessment and Plan / ED Course  I have reviewed the triage vital signs and the nursing notes.  Pertinent labs & imaging results that were available during my care of the patient were reviewed by me and considered in my medical decision making (see chart for details).     Patient presenting with symptoms consistent with a food bolus impaction.  This started around noon today.  Patient will be given IV fluids and glucagon.  Screening labs and a chest x-ray pending as patient does have several other medical conditions.  Patient did not improve with glucagon.  Attempted to give him water and he within 1 minute regurgitated it.  Discussed with Dr. Loreta Ave who will come in to evaluate the patient  Final Clinical Impressions(s) / ED Diagnoses   Final diagnoses:  Esophageal obstruction due to food impaction    New Prescriptions New Prescriptions   No medications on file     Gwyneth Sprout, MD 09/01/17 2132

## 2017-08-29 NOTE — ED Notes (Signed)
Patient transported to X-ray 

## 2017-08-29 NOTE — ED Notes (Signed)
Called  Gastroenterology @2210p .

## 2017-08-29 NOTE — ED Triage Notes (Signed)
[facility] Pt complains of abdominal pain since after lunch. Pt states last time this happened he had food stuck in his throat. Pt is non-ambulatory at baseline.

## 2017-08-29 NOTE — ED Triage Notes (Signed)
Pt complains of abdominal pain and vomiting since lunch time today Pt has a grey color and speaks very mumbled

## 2017-08-30 ENCOUNTER — Encounter (HOSPITAL_COMMUNITY): Payer: Self-pay | Admitting: Gastroenterology

## 2017-08-30 ENCOUNTER — Encounter: Payer: Self-pay | Admitting: Gastroenterology

## 2017-08-30 LAB — URINALYSIS, ROUTINE W REFLEX MICROSCOPIC
Bacteria, UA: NONE SEEN
Bilirubin Urine: NEGATIVE
Glucose, UA: NEGATIVE mg/dL
Ketones, ur: 5 mg/dL — AB
Leukocytes, UA: NEGATIVE
Nitrite: NEGATIVE
Protein, ur: 30 mg/dL — AB
Specific Gravity, Urine: 1.025 (ref 1.005–1.030)
pH: 5 (ref 5.0–8.0)

## 2017-08-30 NOTE — ED Provider Notes (Signed)
12:00 AM  Assumed care from Dr. Anitra LauthPlunkett.  Pt is a 51 y.o. male who presents to the emergency department with esophageal food bolus.  Glucagon attempted without relief.  Dr. Loreta AveMann with GI contacted.  They will see patient for endoscopy.   1:15 AM  Pt has had endoscopy and food bolus has been disimpacted.  Will po challenge.  Pt sedated and procedure performed at bedside in ED.   2:00 AM  Pt has been able to drink.  Received Versed and fentanyl in the emergency department around midnight.  He is back to his baseline.  I feel he is safe to be discharged home.  He is hemodynamically stable.  Discussed return precautions.   Ward, Layla MawKristen N, DO 08/30/17 0200

## 2017-08-30 NOTE — ED Notes (Signed)
[facility] called for transportation  

## 2017-08-30 NOTE — ED Notes (Signed)
Pt. Given water to drink.

## 2017-08-30 NOTE — Op Note (Signed)
[facility] Patient Name: Malik [name] Procedure Date: 08/29/2017 MRN: [id number] Attending MD: [name] , MD Date of Birth: Sep 29, 1966 CSN: [id number] Age: 51 Admit Type: Emergency Department Procedure:                EGD with removal of food impaction. Indications:              Dysphagia, Gastro-esophageal reflux disease,                            Epigastric pain Providers:                [name], MD, Harold Barban, RN, Zoila Shutter,                            Technician Referring MD:             Jeani Hawking, MD Medicines:                Fentanyl 100 micrograms & Midazolam 8 mg IV Complications:            No immediate complications. Estimated Blood Loss:     Estimated blood loss: none. Procedure:                Pre-anesthesia assessment: - Prior to the                            procedure, a history and physical was performed,                            and patient medications and allergies were                            reviewed. The patient's tolerance of previous                            anesthesia was also reviewed. The risks and                            benefits of the procedure and the sedation options                            and risks were discussed with the patient. All                            questions were answered, and informed consent was                            obtained. Prior anticoagulants: The patient has                            taken no previous anticoagulant or antiplatelet                            agents. ASA Grade assessment: III - A patient with  severe systemic disease. After reviewing the risks                            and benefits, the patient was deemed in                            satisfactory condition to undergo the procedure.                           After obtaining informed consent, the endoscope was                            passed under direct vision. Throughout the                      procedure, the patient's blood pressure, pulse, and                            oxygen saturations were monitored continuously. The                            EG-[id number] ([id number] scope was introduced through the                            mouth, and advanced to the antrum of the stomach.                            After obtaining informed consent, the endoscope was                            passed under direct vision. Throughout the                            procedure, the patient's blood pressure, pulse, and                            oxygen saturations were monitored continuously.The                            upper GI endoscopy was extremely difficult due to                            abnormal anatomy. The patient tolerated the                            procedure well. Scope In: Scope Out: Findings:      A large meat bolus was noted impacted at the level of the mid-esophagus;       this was cut up into smaller pieces with a snare x 5 and portions of the       meat bolus were removed with a Roth net x 5. A Talon grasper was used to       further fragment up bolus and then it was gently "pushed' into the       stomach. Removal of food was accomplished. The esophageal  lumen seemed       dilated with no evidence of erosive esophagitis or a stricture. No       mucosal injury was noted as a result of the manipulations described       above.      Patchy mild inflammation characterized by congestion, edema, erythema       and granularity was found in the entire examined stomach.      The cardia and gastric fundus were not visualized due to residual debris       in the cardia. Impression:               - Large meat bolus in the middle third of the                            esophagus-removed successfully-see description                            above.                           - Mild patchy gastritis. Moderate Sedation:      Moderate (conscious) sedation was  administered by the endoscopy nurse       and supervised by the endoscopist. The following parameters were       monitored: oxygen saturation, heart rate, blood pressure, respiratory       rate, EKG, adequacy of pulmonary ventilation, and response to care.       Total physician intraservice time was 20 minutes. Recommendation:           - Full liquid diet daily.                           - Return to see Dr. Elnoria Howard on 08/31/2017.                           - Continue present medications. Procedure Code(s):        --- Professional ---                           [phone], Esophagogastroduodenoscopy, flexible,                            transoral; with removal of foreign body(s) Diagnosis Code(s):        --- Professional ---                           R13.10, Dysphagia, unspecified                           K21.9, Gastro-esophageal reflux disease without                            esophagitis                           R10.13, Epigastric pain                           T18.128A, Food in esophagus causing other  injury,                            initial encounter                           K29.70, Gastritis, unspecified, without bleeding CPT copyright 2016 American Medical Association. All rights reserved. The codes documented in this report are preliminary and upon coder review may  be revised to meet current compliance requirements. [name], MD [name], MD 08/30/2017 12:31:22 AM This report has been signed electronically. Number of Addenda: 0

## 2017-09-01 ENCOUNTER — Encounter: Payer: Self-pay | Admitting: Cardiovascular Disease

## 2017-09-01 ENCOUNTER — Ambulatory Visit (INDEPENDENT_AMBULATORY_CARE_PROVIDER_SITE_OTHER): Payer: Medicaid Other | Admitting: Cardiovascular Disease

## 2017-09-01 VITALS — BP 113/75 | HR 74 | Ht 67.0 in | Wt 118.0 lb

## 2017-09-01 DIAGNOSIS — E7800 Pure hypercholesterolemia, unspecified: Secondary | ICD-10-CM

## 2017-09-01 DIAGNOSIS — I739 Peripheral vascular disease, unspecified: Secondary | ICD-10-CM

## 2017-09-01 NOTE — Patient Instructions (Signed)
Medication Instructions:  Your physician recommends that you continue on your current medications as directed. Please refer to the Current Medication list given to you today.  Labwork: FASTING LIPID SOON   Testing/Procedures: NONE  Follow-Up: Your physician wants you to follow-up in: 6 MONTH OV You will receive a reminder letter in the mail two months in advance. If you don't receive a letter, please call our office to schedule the follow-up appointment.  If you need a refill on your cardiac medications before your next appointment, please call your pharmacy.

## 2017-09-01 NOTE — Progress Notes (Signed)
Cardiology Office Note   Date:  09/01/2017   ID:  Malik [name], DOB 09-18-1966, MRN [id number]  PCP:  [name], MD  Cardiologist:   Chilton Si, MD   No chief complaint on file.     History of Present Illness: Malik [name] is a 51 y.o. male with hypertenison, PAD, prior DVT, tobacco abuse and muscular dystrophy here for follow up.  He was initially seen 01/2017 for presurgical risk assessment prior to R axillary bifemoral bypass surgery.  He has muscular dystrophy and is unable to walk.  Therefore, he was referred for [facility] 03/01/17 that revealed LVEF 77% and no ischemia.  He was cleared for surgery.  Since surgery he has been doing well.  He denies any chest pain or shortness of breath.  He also has not noted any lightheadedness or dizziness.  He is not able to get much exercise due to his muscular dystrophy.  His breathing has been stable and he denies any orthopnea or PND.  He was seen in the emergency department 10/30 with a food impaction.  He was scheduled to see GI 10/31 but was unaware of that appointment.  He quit smoking 08/05/17.   Past Medical History:  Diagnosis Date  . DVT (deep venous thrombosis) (HCC)    left brachial vein DVT 01/29/16  . Hiccups   . High blood pressure   . History of kidney stones   . Hypertension   . Myotonic muscular dystrophy [facility])     Past Surgical History:  Procedure Laterality Date  . AXILLARY-FEMORAL BYPASS GRAFT Right 03/07/2017   Procedure: RIGHT AXILLA-BIFEMORAL BYPASS GRAFT USING HEMASHIELD GOLD VASCULAR GRAFT;  Surgeon: [name], MD;  Location: [facility] OR;  Service: Vascular;  Laterality: Right;  . CHOLECYSTECTOMY    . ENDARTERECTOMY FEMORAL Left 03/07/2017   Procedure: ENDARTERECTOMY LEFT FEMORAL ARTERY ;  Surgeon: [name], MD;  Location: [facility] OR;  Service: Vascular;  Laterality: Left;  . ESOPHAGOGASTRODUODENOSCOPY N/A 05/10/2016   Procedure: ESOPHAGOGASTRODUODENOSCOPY (EGD);  Surgeon: [name], MD;  Location: [facility];  Service: Endoscopy;  Laterality: N/A;  Please discuss sedation possibilities with Dr. [name]  . ESOPHAGOGASTRODUODENOSCOPY N/A 08/29/2017   Procedure: ESOPHAGOGASTRODUODENOSCOPY (EGD);  Surgeon: [name], MD;  Location: [facility];  Service: Endoscopy;  Laterality: N/A;  . MULTIPLE TOOTH EXTRACTIONS  2007   Pt had all teeth pulled, has not yet gotten dentures.   Marland Kitchen PATCH ANGIOPLASTY Left 03/07/2017   Procedure: PATCH ANGIOPLASTY LEFT FEMORAL ARTERY USING XENOSURE BIOLOGIC PATCH;  Surgeon: [name], MD;  Location: [facility] OR;  Service: Vascular;  Laterality: Left;     Current Outpatient Prescriptions  Medication Sig Dispense Refill  . aspirin EC 81 MG tablet Take 81 mg by mouth daily.    Marland Kitchen atorvastatin (LIPITOR) 40 MG tablet Take 40 mg by mouth every evening.    . diphenhydrAMINE (BENADRYL) 25 mg capsule Take 25 mg by mouth 3 (three) times daily as needed for itching.    . fenofibrate (TRICOR) 145 MG tablet Take 145 mg by mouth daily.    . metoCLOPramide (REGLAN) 10 MG tablet Take 10 mg by mouth every 8 (eight) hours as needed for nausea (hiccups).    . mirtazapine (REMERON) 15 MG tablet Take 15 mg by mouth at bedtime.    . pantoprazole (PROTONIX) 40 MG tablet Take 40 mg by mouth daily.     No current facility-administered medications for this visit.  Allergies:   Patient has no known allergies.    Social History:  The patient  reports that he has been smoking Cigarettes.  He started smoking about 33 years ago. He has been smoking about 0.30 packs per day. He has never used smokeless tobacco. He reports that he does not drink alcohol or use drugs.   Family History:  The patient's family history includes Breast cancer in his mother; COPD in his mother; Cancer in his father and sister; Diabetes in his mother; Muscular dystrophy in his sister.    ROS:  Please see the history of present illness.   Otherwise, review of systems are positive  for insomnia.   All other systems are reviewed and negative.    PHYSICAL EXAM: VS:  BP 113/75   Pulse 74   Ht 5\' 7"  (1.702 m)   Wt 53.5 kg (118 lb)   BMI 18.48 kg/m  , BMI Body mass index is 18.48 kg/m. GENERAL: Chronically ill-appearing HEENT: Pupils equal round and reactive, fundi not visualized, oral mucosa unremarkable NECK:  No jugular venous distention, waveform within normal limits, carotid upstroke brisk and symmetric, no bruits, no thyromegaly LUNGS:  Clear to auscultation bilaterally HEART:  RRR.  PMI not displaced or sustained,S1 and S2 within normal limits, no S3, no S4, no clicks, no rubs, no murmurs ABD:  Flat, positive bowel sounds normal in frequency in pitch, no bruits, no rebound, no guarding, no midline pulsatile mass, no hepatomegaly, no splenomegaly EXT:  2 plus pulses throughout, no edema, no cyanosis no clubbing SKIN:  No rashes no nodules NEURO:  Cranial nerves II through XII grossly intact, motor grossly intact throughout PSYCH:  Cognitively intact, oriented to person place and time    EKG:  EKG is not ordered today. The ekg ordered 11/25/16 demonstrates sinus bradycardia rate 53 bpm.  LVH with repolarization abnormalities.   Lexiscan Myoview 03/01/17:  Nuclear stress EF: 77%.  The left ventricular ejection fraction is hyperdynamic (>65%).  There was no ST segment deviation noted during stress.  The study is normal.   Normal stress nuclear study with no ischemia or infarction; EF 77 with normal wall motion.  Recent Labs: 08/29/2017: ALT 29; BUN 17; Creatinine, Ser 0.60; Hemoglobin 16.5; Platelets 156; Potassium 3.6; Sodium 143    Lipid Panel    Component Value Date/Time   CHOL 217 (H) 07/25/2013 1419   TRIG 303 (H) 07/25/2013 1419   HDL 52 07/25/2013 1419   CHOLHDL 4.2 07/25/2013 1419   VLDL 61 (H) 07/25/2013 1419   LDLCALC 104 (H) 07/25/2013 1419      Wt Readings from Last 3 Encounters:  09/01/17 53.5 kg (118 lb)  08/29/17 53.5 kg (118  lb)  07/21/17 53.5 kg (118 lb)      ASSESSMENT AND PLAN:  # PAD: Continue aspirin and atorvastatin.  He will return for fasting lipids.  LFTs were normal 08/2017.  # Hypertension: Blood pressure controlled on HCTZ,  # Tobacco abuse: Malik [name] was congratulated on his smoking cessation.   Current medicines are reviewed at length with the patient today.  The patient does not have concerns regarding medicines.  The following changes have been made:  Start aspirin 81mg  and statin pending labs  Labs/ tests ordered today include:   Orders Placed This Encounter  Procedures  . Lipid panel     Disposition:   FU with Malik [name] C. Duke Salviaandolph, MD, [facility] in 6 months    This note was written with the assistance of  speech recognition software.  Please excuse any transcriptional errors.  Signed, Pasha Gadison C. Duke Salvia, MD, [facility]  09/01/2017 10:44 AM    [facility]

## 2017-10-20 ENCOUNTER — Ambulatory Visit (HOSPITAL_COMMUNITY)
Admission: RE | Admit: 2017-10-20 | Discharge: 2017-10-20 | Disposition: A | Discharge status: Home / Self Care | Payer: Medicaid Other | Source: Ambulatory Visit | Attending: Vascular Surgery | Admitting: Vascular Surgery

## 2017-10-20 ENCOUNTER — Ambulatory Visit (INDEPENDENT_AMBULATORY_CARE_PROVIDER_SITE_OTHER): Payer: Medicaid Other | Admitting: Family

## 2017-10-20 ENCOUNTER — Ambulatory Visit (INDEPENDENT_AMBULATORY_CARE_PROVIDER_SITE_OTHER)
Admission: RE | Admit: 2017-10-20 | Discharge: 2017-10-20 | Disposition: A | Discharge status: Home / Self Care | Payer: Medicaid Other | Source: Ambulatory Visit | Attending: Vascular Surgery | Admitting: Vascular Surgery

## 2017-10-20 ENCOUNTER — Encounter: Payer: Self-pay | Admitting: Family

## 2017-10-20 VITALS — BP 116/82 | HR 68 | Temp 96.8°F | Resp 17 | Wt 118.0 lb

## 2017-10-20 DIAGNOSIS — I779 Disorder of arteries and arterioles, unspecified: Secondary | ICD-10-CM

## 2017-10-20 DIAGNOSIS — F17200 Nicotine dependence, unspecified, uncomplicated: Secondary | ICD-10-CM | POA: Insufficient documentation

## 2017-10-20 NOTE — Patient Instructions (Signed)
Steps to Quit Smoking Smoking tobacco can be bad for your health. It can also affect almost every organ in your body. Smoking puts you and people around you at risk for many serious long-lasting (chronic) diseases. Quitting smoking is hard, but it is one of the best things that you can do for your health. It is never too late to quit. What are the benefits of quitting smoking? When you quit smoking, you lower your risk for getting serious diseases and conditions. They can include:  Lung cancer or lung disease.  Heart disease.  Stroke.  Heart attack.  Not being able to have children (infertility).  Weak bones (osteoporosis) and broken bones (fractures).  If you have coughing, wheezing, and shortness of breath, those symptoms may get better when you quit. You may also get sick less often. If you are pregnant, quitting smoking can help to lower your chances of having a baby of low birth weight. What can I do to help me quit smoking? Talk with your doctor about what can help you quit smoking. Some things you can do (strategies) include:  Quitting smoking totally, instead of slowly cutting back how much you smoke over a period of time.  Going to in-person counseling. You are more likely to quit if you go to many counseling sessions.  Using resources and support systems, such as: ? Online chats with a counselor. ? Phone quitlines. ? Printed self-help materials. ? Support groups or group counseling. ? Text messaging programs. ? Mobile phone apps or applications.  Taking medicines. Some of these medicines may have nicotine in them. If you are pregnant or breastfeeding, do not take any medicines to quit smoking unless your doctor says it is okay. Talk with your doctor about counseling or other things that can help you.  Talk with your doctor about using more than one strategy at the same time, such as taking medicines while you are also going to in-person counseling. This can help make  quitting easier. What things can I do to make it easier to quit? Quitting smoking might feel very hard at first, but there is a lot that you can do to make it easier. Take these steps:  Talk to your family and friends. Ask them to support and encourage you.  Call phone quitlines, reach out to support groups, or work with a counselor.  Ask people who smoke to not smoke around you.  Avoid places that make you want (trigger) to smoke, such as: ? Bars. ? Parties. ? Smoke-break areas at work.  Spend time with people who do not smoke.  Lower the stress in your life. Stress can make you want to smoke. Try these things to help your stress: ? Getting regular exercise. ? Deep-breathing exercises. ? Yoga. ? Meditating. ? Doing a body scan. To do this, close your eyes, focus on one area of your body at a time from head to toe, and notice which parts of your body are tense. Try to relax the muscles in those areas.  Download or buy apps on your mobile phone or tablet that can help you stick to your quit plan. There are many free apps, such as QuitGuide from the CDC (Centers for Disease Control and Prevention). You can find more support from smokefree.gov and other websites.  This information is not intended to replace advice given to you by your health care provider. Make sure you discuss any questions you have with your health care provider. Document Released: 08/14/2009 Document   Revised: 06/15/2016 Document Reviewed: 03/04/2015 Elsevier Interactive Patient Education  2018 Elsevier Inc.     Peripheral Vascular Disease Peripheral vascular disease (PVD) is a disease of the blood vessels that are not part of your heart and brain. A simple term for PVD is poor circulation. In most cases, PVD narrows the blood vessels that carry blood from your heart to the rest of your body. This can result in a decreased supply of blood to your arms, legs, and internal organs, like your stomach or kidneys.  However, it most often affects a person's lower legs and feet. There are two types of PVD.  Organic PVD. This is the more common type. It is caused by damage to the structure of blood vessels.  Functional PVD. This is caused by conditions that make blood vessels contract and tighten (spasm).  Without treatment, PVD tends to get worse over time. PVD can also lead to acute ischemic limb. This is when an arm or limb suddenly has trouble getting enough blood. This is a medical emergency. Follow these instructions at home:  Take medicines only as told by your doctor.  Do not use any tobacco products, including cigarettes, chewing tobacco, or electronic cigarettes. If you need help quitting, ask your doctor.  Lose weight if you are overweight, and maintain a healthy weight as told by your doctor.  Eat a diet that is low in fat and cholesterol. If you need help, ask your doctor.  Exercise regularly. Ask your doctor for some good activities for you.  Take good care of your feet. ? Wear comfortable shoes that fit well. ? Check your feet often for any cuts or sores. Contact a doctor if:  You have cramps in your legs while walking.  You have leg pain when you are at rest.  You have coldness in a leg or foot.  Your skin changes.  You are unable to get or have an erection (erectile dysfunction).  You have cuts or sores on your feet that are not healing. Get help right away if:  Your arm or leg turns cold and blue.  Your arms or legs become red, warm, swollen, painful, or numb.  You have chest pain or trouble breathing.  You suddenly have weakness in your face, arm, or leg.  You become very confused or you cannot speak.  You suddenly have a very bad headache.  You suddenly cannot see. This information is not intended to replace advice given to you by your health care provider. Make sure you discuss any questions you have with your health care provider. Document Released:  01/12/2010 Document Revised: 03/25/2016 Document Reviewed: 03/28/2014 Elsevier Interactive Patient Education  2017 Elsevier Inc.  

## 2017-10-20 NOTE — Progress Notes (Signed)
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 male who is s/p Right axillary bifemoral bypass on 03-07-17 by Dr. Darrick PennaFields for rest pain in left foot.  He states that he really has no rest pain in his feet or legs.   Dr. Darrick PennaFields last evaluated pt on 03-31-17. At that time right flank bypass graft was palpable, well-healed axillary and groin incisions, no erythema over the graft. Doing well status post axillary bifemoral bypass graft. The patient was shown how to check the pulse in his graft that day and he will call us if he does not find this pulse. Otherwise he was to follow-up with us with a graft duplex scan ABIs in 3 months.   He is a resident of [facility].   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   Pt Diabetic: No Pt smoker: current smoker, 1 pack per month, started in 1985  Pt meds include: Statin :Yes Betablocker: No ASA: yes Other anticoagulants/antiplatelets: no    Past Medical History:  Diagnosis Date  . DVT (deep venous thrombosis) (HCC)    left brachial vein DVT 01/29/16  . Hiccups   . High blood pressure   . History of kidney stones   . Hypertension   . Myotonic muscular dystrophy [facility](HCC)     Social History Social History   Tobacco Use  . Smoking status: Former Smoker    Packs/day: 0.30    Types: Cigarettes    Start date: 11/02/1983    Last attempt to quit: 08/01/2017    Years since quitting: 0.2  . Smokeless tobacco: Never Used  . Tobacco comment: 4-5 cigs per day  Substance Use Topics  . Alcohol use: No  . Drug use: No    Comment: Pt smokes MJ ocassinally.  Denies ever using IV drugs.     Family History Family History  Problem Relation Age of Onset  . COPD Mother   . Diabetes Mother   . Breast cancer Mother   . Cancer Father   . Cancer Sister   . Muscular dystrophy Sister      Past Surgical History:  Procedure Laterality Date  . AXILLARY-FEMORAL BYPASS GRAFT Right 03/07/2017   Procedure: RIGHT AXILLA-BIFEMORAL BYPASS GRAFT USING HEMASHIELD GOLD VASCULAR GRAFT;  Surgeon: Sherren KernsFields, Charles E, MD;  [facility];  Service: Vascular;  Laterality: Right;  . CHOLECYSTECTOMY    . ENDARTERECTOMY FEMORAL Left 03/07/2017   Procedure: ENDARTERECTOMY LEFT FEMORAL ARTERY ;  Surgeon: Sherren KernsFields, Charles E, MD;  [facility];  Service: Vascular;  Laterality: Left;  . ESOPHAGOGASTRODUODENOSCOPY N/A 05/10/2016   Procedure: ESOPHAGOGASTRODUODENOSCOPY (EGD);  Surgeon: Sherrilyn RistHenry L Danis III, MD;  [facility];  Service: Endoscopy;  Laterality: N/A;  Please discuss sedation possibilities with Dr. Myrtie Neitheranis  . ESOPHAGOGASTRODUODENOSCOPY N/A 08/29/2017   Procedure: ESOPHAGOGASTRODUODENOSCOPY (EGD);  Surgeon: Charna ElizabethMann, Jyothi, MD;  [facility];  Service: Endoscopy;  Laterality: N/A;  . MULTIPLE TOOTH EXTRACTIONS  2007   Pt had all teeth pulled, has not yet gotten dentures.   Marland Kitchen. PATCH ANGIOPLASTY Left 03/07/2017   Procedure: PATCH ANGIOPLASTY LEFT FEMORAL ARTERY USING XENOSURE BIOLOGIC PATCH;  Surgeon: Sherren KernsFields, Charles E, MD;  [facility];  Service: Vascular;  Laterality: Left;    No Known Allergies  Current Outpatient Medications  Medication Sig Dispense Refill  . atorvastatin (LIPITOR) 40 MG tablet  Take 40 mg by mouth every evening.    . fenofibrate (TRICOR) 145 MG tablet Take 145 mg by mouth daily.    . mirtazapine (REMERON) 15 MG tablet Take 15 mg by mouth at bedtime.    . pantoprazole (PROTONIX) 40 MG tablet Take 40 mg by mouth daily.    . metoCLOPramide (REGLAN) 10 MG tablet Take 10 mg by mouth every 8 (eight) hours as needed for nausea (hiccups).     No current facility-administered medications for this visit.     ROS: See HPI for pertinent positives and negatives.   Physical Examination  Vitals:   10/20/17 1431 10/20/17 1435  BP: 124/86 116/82  Pulse: 68    Resp: 17   Temp: (!) 96.8 F (36 C)   TempSrc: Oral   SpO2: 100%   Weight: 118 lb (53.5 kg)    Body mass index is 18.48 kg/m.  General: A&O x 3, wearing pajama pants and a t-shirt, thin male. Gait: seated in w/c Eyes: PERRLA. Pulmonary: Respirations are non labored, CTAB, fair air movement Cardiac: regular rhythm, no detected murmur.         Carotid Bruits Right Left   Negative Negative   Radial pulses are faintly palpable bilaterally. Both hands are pale, general mild pallor.  Adominal aortic pulse is not palpable . Right flank bypass graft pulse is palpable.                        VASCULAR EXAM: Extremities without ischemic changes,  without Gangrene; without open wounds.                                                                                                                                                       LE Pulses Right Left       FEMORAL  faintly palpable, seated in w/c  not palpable        POPLITEAL  not palpable   not palpable       POSTERIOR TIBIAL  not palpable   not palpable        DORSALIS PEDIS      ANTERIOR TIBIAL not palpable  not palpable    Abdomen: soft, NT, no palpable masses. Skin: no rashes, no ulcers noted. Musculoskeletal: + muscle wasting and atrophy in arms and legs           Neurologic: A&O X 3; Appropriate Affect ; SENSATION: normal; MOTOR FUNCTION:  moving all extremities equally, motor strength 2/5 in right UE, 3/5 in left UE, and 4/5 in legs. Speech is fluent/normal. CN 2-12 intact   ASSESSMENT: ARATH KAIGLER is a 51 y.o. male who is s/p Right axillary bifemoral bypass on 03-07-17. Right flank bypass graft pulse is palpable.   He denies rest pain in his lower extremities. 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 He has muscle wasting in his arms and legs.   His atherosclerotic risk factors include current smoker, but at a reduced rate of 1 pack/month. Fortunately he does not have DM.  He  takes a daily statin and ASA.    DATA    Right axillary bifemoral bypass duplex (10/20/17): Patent right axillary-bifemoral bypass graft without evidence of stenosis, triphasic waveforms throughout.   No change compared to the exam on 07/21/17.   ABI (Date: 07/21/2017):  R:  ? ABI: 0.71 (was 0.72 on 03-08-17),  ? PT: bi ? DP: bi ? TBI:  0.54  L:  ? ABI: 0.68 (was 0.78),  ? PT: mono ? DP: mono ? TBI: 0.49 ? Stable on the right, mild decline in the left ABI; moderate disease in bilateral lower extremities.   ABI (Date: 10/20/2017):  R:   ABI: 0.73 (was 0.71 on 07-21-17),   PT: bi  DP: absent  TBI:  0.69 (was 0.54)  L:   ABI: 0.70 (was 0.68),   PT: mono  DP: mono  TBI: 0.57 (was 0.49) Stable bilateral ABI with moderate disease bilaterally.   PLAN:  Based on the patient's vascular studies and examination, pt will return to clinic in 3 months with right Ax-fem bypass graft duplex and ABI's. I advised pt to notify us if he develops concerns re the circulation in his feet or legs.     I discussed in depth with the patient the nature of atherosclerosis, and emphasized the importance of maximal medical management including strict control of blood pressure, blood glucose, and lipid levels, obtaining regular exercise, and cessation of smoking.  The patient is aware that without maximal medical management the underlying atherosclerotic disease process will progress, limiting the benefit of any interventions.  The patient was given information about PAD including signs, symptoms, treatment, what symptoms should prompt the patient to seek immediate medical care, and risk reduction measures to take.  Charisse MarchSuzanne Katalia Choma, RN, MSN, FNP-C [facility] [place] Office Phone: [phone]  Clinic MD: Darrick PennaFields  10/20/17 2:38 PM

## 2017-10-21 NOTE — Addendum Note (Signed)
Addended by: Burton ApleyPETTY, Lynzee Lindquist A on: 10/21/2017 02:28 PM   Modules accepted: Orders

## 2018-01-02 ENCOUNTER — Emergency Department (HOSPITAL_COMMUNITY): Payer: Medicaid Other

## 2018-01-02 ENCOUNTER — Emergency Department (HOSPITAL_COMMUNITY)
Admission: EM | Admit: 2018-01-02 | Discharge: 2018-01-02 | Disposition: A | Discharge status: Home / Self Care | Payer: Medicaid Other | Attending: Emergency Medicine | Admitting: Emergency Medicine

## 2018-01-02 ENCOUNTER — Encounter (HOSPITAL_COMMUNITY): Payer: Self-pay | Admitting: Emergency Medicine

## 2018-01-02 DIAGNOSIS — Z87891 Personal history of nicotine dependence: Secondary | ICD-10-CM | POA: Diagnosis not present

## 2018-01-02 DIAGNOSIS — R1013 Epigastric pain: Secondary | ICD-10-CM | POA: Diagnosis not present

## 2018-01-02 DIAGNOSIS — G7111 Myotonic muscular dystrophy: Secondary | ICD-10-CM | POA: Insufficient documentation

## 2018-01-02 DIAGNOSIS — M7918 Myalgia, other site: Secondary | ICD-10-CM | POA: Diagnosis not present

## 2018-01-02 DIAGNOSIS — I10 Essential (primary) hypertension: Secondary | ICD-10-CM | POA: Insufficient documentation

## 2018-01-02 DIAGNOSIS — M546 Pain in thoracic spine: Secondary | ICD-10-CM | POA: Insufficient documentation

## 2018-01-02 DIAGNOSIS — Z79899 Other long term (current) drug therapy: Secondary | ICD-10-CM | POA: Insufficient documentation

## 2018-01-02 DIAGNOSIS — M549 Dorsalgia, unspecified: Secondary | ICD-10-CM | POA: Diagnosis present

## 2018-01-02 LAB — COMPREHENSIVE METABOLIC PANEL
ALT: 15 U/L — ABNORMAL LOW (ref 17–63)
AST: 25 U/L (ref 15–41)
Albumin: 3.4 g/dL — ABNORMAL LOW (ref 3.5–5.0)
Alkaline Phosphatase: 77 U/L (ref 38–126)
Anion gap: 8 (ref 5–15)
BUN: 15 mg/dL (ref 6–20)
CO2: 29 mmol/L (ref 22–32)
Calcium: 9.2 mg/dL (ref 8.9–10.3)
Chloride: 111 mmol/L (ref 101–111)
Creatinine, Ser: 0.62 mg/dL (ref 0.61–1.24)
GFR calc Af Amer: >60 mL/min (ref >60)
GFR calc non Af Amer: >60 mL/min (ref >60)
Glucose, Bld: 111 mg/dL — ABNORMAL HIGH (ref 65–99)
Potassium: 3.5 mmol/L (ref 3.5–5.1)
Sodium: 148 mmol/L — ABNORMAL HIGH (ref 135–145)
Total Bilirubin: 1 mg/dL (ref 0.3–1.2)
Total Protein: 5.8 g/dL — ABNORMAL LOW (ref 6.5–8.1)

## 2018-01-02 LAB — CBC WITH DIFFERENTIAL/PLATELET
Basophils Absolute: 0 10*3/uL (ref 0.0–0.1)
Basophils Relative: 0 %
Eosinophils Absolute: 0 10*3/uL (ref 0.0–0.7)
Eosinophils Relative: 0 %
HCT: 45.3 % (ref 39.0–52.0)
Hemoglobin: 14.8 g/dL (ref 13.0–17.0)
Lymphocytes Relative: 9 %
Lymphs Abs: 0.8 10*3/uL (ref 0.7–4.0)
MCH: 32.6 pg (ref 26.0–34.0)
MCHC: 32.7 g/dL (ref 30.0–36.0)
MCV: 99.8 fL (ref 78.0–100.0)
Monocytes Absolute: 0.4 10*3/uL (ref 0.1–1.0)
Monocytes Relative: 5 %
Neutro Abs: 7.2 10*3/uL (ref 1.7–7.7)
Neutrophils Relative %: 86 %
Platelets: 124 10*3/uL — ABNORMAL LOW (ref 150–400)
RBC: 4.54 MIL/uL (ref 4.22–5.81)
RDW: 16.4 % — ABNORMAL HIGH (ref 11.5–15.5)
WBC: 8.4 10*3/uL (ref 4.0–10.5)

## 2018-01-02 LAB — LIPASE, BLOOD: Lipase: 25 U/L (ref 11–51)

## 2018-01-02 MED ORDER — HYDROCODONE-ACETAMINOPHEN 5-325 MG PO TABS
1.0000 | ORAL_TABLET | Freq: Once | ORAL | Status: AC
  Administered 2018-01-02: 1 via ORAL
  Filled 2018-01-02: qty 1

## 2018-01-02 MED ORDER — METHOCARBAMOL 500 MG PO TABS: 500.0000 mg | ORAL_TABLET | Freq: Two times a day (BID) | ORAL | 0 refills | Status: AC | PRN

## 2018-01-02 NOTE — ED Notes (Signed)
Bed: [id number] Expected date:  Expected time:  Means of arrival:  Comments: 52 yo M/ Abd pain SN[facility]

## 2018-01-02 NOTE — ED Notes (Signed)
Patient transported to X-ray 

## 2018-01-02 NOTE — ED Provider Notes (Signed)
[facility]-EMERGENCY DEPT Provider Note   CSN: [id number] Arrival date & time: 01/02/18  [id number]     History   Chief Complaint Chief Complaint  Patient presents with  . Back Pain  . Abdominal Pain    HPI Malik [name] is a 52 y.o. male BIB EMS with PMH/o HTN, Myotonic Muscular dystrophy who presents for evaluation of right sided back pain that began last night at approximately 10 PM.  Patient reports that pain is worse with movement.  He denies any preceding trauma, injury, fall.  He states that pain is not worse with deep inspiration.  Patient also reports that he is having some generalized abdominal pain that began earlier this morning.  He does not feel like the 2 pains are connected and that it is a separate pain.  Patient reports difficulty walking secondary to muscular dystrophy.  He uses a wheelchair.  Patient denies any fevers, chest pain, difficulty breathing, nausea/vomiting, swelling of his legs.  The history is provided by the patient.    Past Medical History:  Diagnosis Date  . DVT (deep venous thrombosis) (HCC)    left brachial vein DVT 01/29/16  . Hiccups   . High blood pressure   . History of kidney stones   . Hypertension   . Myotonic muscular dystrophy [facility](HCC)     Patient Active Problem List   Diagnosis Date Noted  . PAD (peripheral artery disease) (HCC) 06/07/2016  . Leg pain, bilateral 06/02/2016  . Esophageal obstruction due to food impaction   . DVT of upper extremity (deep vein thrombosis) (HCC) 01/29/2016  . Residence in long term care facility 03/04/2014  . EKG abnormality 11/09/2013  . Health care maintenance 06/22/2013  . Epidermal cyst 06/22/2013  . Skin lesion of hand 06/22/2013  . 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 Hypertension, benign 01/20/2012  . Unspecified vitamin D deficiency 08/05/2011  . Vitamin B 12 deficiency 07/14/2011  . 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 Muscle weakness (generalized) 04/16/2011  .  Weight finding 04/16/2011  . Tobacco user 04/16/2011    Past Surgical History:  Procedure Laterality Date  . AXILLARY-FEMORAL BYPASS GRAFT Right 03/07/2017   Procedure: RIGHT AXILLA-BIFEMORAL BYPASS GRAFT USING HEMASHIELD GOLD VASCULAR GRAFT;  Surgeon: Sherren KernsFields, Charles E, MD;  [facility];  Service: Vascular;  Laterality: Right;  . CHOLECYSTECTOMY    . ENDARTERECTOMY FEMORAL Left 03/07/2017   Procedure: ENDARTERECTOMY LEFT FEMORAL ARTERY ;  Surgeon: Sherren KernsFields, Charles E, MD;  [facility];  Service: Vascular;  Laterality: Left;  . ESOPHAGOGASTRODUODENOSCOPY N/A 05/10/2016   Procedure: ESOPHAGOGASTRODUODENOSCOPY (EGD);  Surgeon: Sherrilyn RistHenry L Danis III, MD;  [facility];  Service: Endoscopy;  Laterality: N/A;  Please discuss sedation possibilities with Dr. Myrtie Neitheranis  . ESOPHAGOGASTRODUODENOSCOPY N/A 08/29/2017   Procedure: ESOPHAGOGASTRODUODENOSCOPY (EGD);  Surgeon: Charna ElizabethMann, Jyothi, MD;  [facility];  Service: Endoscopy;  Laterality: N/A;  . MULTIPLE TOOTH EXTRACTIONS  2007   Pt had all teeth pulled, has not yet gotten dentures.   Marland Kitchen. PATCH ANGIOPLASTY Left 03/07/2017   Procedure: PATCH ANGIOPLASTY LEFT FEMORAL ARTERY USING XENOSURE BIOLOGIC PATCH;  Surgeon: Sherren KernsFields, Charles E, MD;  [facility];  Service: Vascular;  Laterality: Left;       Home Medications    Prior to Admission medications   Medication Sig Start Date End Date Taking? Authorizing Provider  atorvastatin (LIPITOR) 40 MG tablet Take 40 mg by mouth every evening.   Yes [provider]  fenofibrate (TRICOR) 145 MG tablet Take  145 mg by mouth daily.   Yes [provider]  mirtazapine (REMERON) 15 MG tablet Take 15 mg by mouth at bedtime.   Yes [provider]  pantoprazole (PROTONIX) 40 MG tablet Take 40 mg by mouth daily.   Yes [provider]  methocarbamol (ROBAXIN) 500 MG tablet Take 1 tablet (500 mg total) by mouth 2 (two) times daily as needed for muscle spasms. 01/02/18    [name], MD    Family History Family History  Problem Relation Age of Onset  . COPD Mother   . Diabetes Mother   . Breast cancer Mother   . Cancer Father   . Cancer Sister   . Muscular dystrophy Sister     Social History Social History   Tobacco Use  . Smoking status: Former Smoker    Packs/day: 0.30    Types: Cigarettes    Start date: 11/02/1983    Last attempt to quit: 08/01/2017    Years since quitting: 0.4  . Smokeless tobacco: Never Used  . Tobacco comment: 4-5 cigs per day  Substance Use Topics  . Alcohol use: No  . Drug use: No    Comment: Pt smokes MJ ocassinally.  Denies ever using IV drugs.      Allergies   Patient has no known allergies.   Review of Systems Review of Systems  Constitutional: Negative for fever.  Respiratory: Negative for cough and shortness of breath.   Cardiovascular: Negative for chest pain.  Gastrointestinal: Positive for abdominal pain. Negative for nausea and vomiting.  Genitourinary: Negative for dysuria and hematuria.  Musculoskeletal: Positive for back pain. Negative for neck pain.  Neurological: Negative for headaches.  All other systems reviewed and are negative.    Physical Exam Updated Vital Signs BP 113/76   Pulse (!) 57   Temp 99.1 F (37.3 C) (Oral)   Resp (!) 26   SpO2 98%   Physical Exam  Constitutional: He is oriented to person, place, and time. He appears well-developed and well-nourished.  HENT:  Head: Normocephalic and atraumatic.  Mouth/Throat: Oropharynx is clear and moist and mucous membranes are normal.  Full flexion/extension and lateral movement of neck fully intact. No bony midline tenderness. No deformities or crepitus.   Eyes: Conjunctivae, EOM and lids are normal. Pupils are equal, round, and reactive to light.  Neck: Full passive range of motion without pain.  Cardiovascular: Normal rate, regular rhythm and normal heart sounds. Exam reveals no gallop and no friction rub.  No murmur  heard. Pulses:      Radial pulses are 2+ on the right side, and 2+ on the left side.       Dorsalis pedis pulses are 2+ on the right side, and 2+ on the left side.  Pulmonary/Chest: Effort normal and breath sounds normal.  No evidence of respiratory distress. Able to speak in full sentences without difficulty.  Abdominal: Soft. Normal appearance. There is tenderness in the epigastric area. There is no rigidity and no guarding.  Musculoskeletal: Normal range of motion.       Back:  Diffuse muscular tenderness palpation overlying the right paraspinal muscles of the thoracic region that extended to the midline.  No deformity or crepitus noted.  No midline L-spine tenderness.  Neurological: He is alert and oriented to person, place, and time. He displays atrophy.  Follows commands, Moves all extremities  Symmetric strength to BUE and BLE  Sensation intact throughout all major nerve distributions  Skin: Skin is warm and  dry. Capillary refill takes less than 2 seconds.  Psychiatric: He has a normal mood and affect. His speech is normal.  Nursing note and vitals reviewed.    ED Treatments / Results  Labs (all labs ordered are listed, but only abnormal results are displayed) Labs Reviewed  CBC WITH DIFFERENTIAL/PLATELET - Abnormal; Notable for the following components:      Result Value   RDW 16.4 (*)    Platelets 124 (*)    All other components within normal limits  COMPREHENSIVE METABOLIC PANEL - Abnormal; Notable for the following components:   Sodium 148 (*)    Glucose, Bld 111 (*)    Total Protein 5.8 (*)    Albumin 3.4 (*)    ALT 15 (*)    All other components within normal limits  LIPASE, BLOOD    EKG  EKG Interpretation  Date/Time:  Monday January 02 2018 07:48:34 EST Ventricular Rate:  60 PR Interval:    QRS Duration: 83 QT Interval:  412 QTC Calculation: 412 R Axis:   19 Text Interpretation:  Sinus rhythm Prolonged PR interval Left ventricular hypertrophy Inferior  infarct, acute (LCx) ST elevation, likely due to LVH Lateral leads are also involved No significant change since last tracing Confirmed by [name] ([id number]) on 01/02/2018 7:56:19 AM Also confirmed by [name] ([id number]), editor Sheppard Evens [phone])  on 01/02/2018 9:50:03 AM       Radiology Dg Thoracic Spine 2 View  Result Date: 01/02/2018 CLINICAL DATA:  Generalized pain.  Poor historian. EXAM: THORACIC SPINE 2 VIEWS COMPARISON:  Chest x-ray dated 08/29/2017 FINDINGS: There is no evidence of thoracic spine fracture. Alignment is normal. No acute or suspicious osseous lesion. Mild degenerative changes again noted within the mid and lower thoracic spine. Visualized paravertebral soft tissues are unremarkable. IMPRESSION: 1. No acute findings. 2. Mild degenerative change. Electronically Signed   By: Bary Richard M.D.   On: 01/02/2018 08:22    Procedures Procedures (including critical care time)  Medications Ordered in ED Medications  HYDROcodone-acetaminophen (NORCO/VICODIN) 5-325 MG per tablet 1 tablet (1 tablet Oral Given 01/02/18 0851)     Initial Impression / Assessment and Plan / ED Course  I have reviewed the triage vital signs and the nursing notes.  Pertinent labs & imaging results that were available during my care of the patient were reviewed by me and considered in my medical decision making (see chart for details).     52 y.o. M with PMH/o Muscular dystrophy, DVT who presents for evaluation of right sided back pain that began this morning. Worse with movement. No preceding trauma, injury or fall. No chest pain, SOB, fever. Also with some mild epigastric abdominal pain but does not feel like the pain is related. Patient is afebrile, non-toxic appearing, sitting comfortably on examination table. Vital signs reviewed and stable. On exam, patient with diffuse muscular tenderness noted to the paraspinal muscles of the thoracic region. Abdomen is soft, non-distended,  non-tender. He is resting comfortably. Patient does have a history of dystropy and does not ambulate. He uses the assistance of the wheelchair. He is neurovascularly intact. Consider muscle strain. Exam not concerning for ACS etiology. Also consider infectious etiology. Exam not concerning for PE. Plan to check XR and basic labs. Plan for analgesics.   XR negative for any acute abnormalities. CMP unremarkable. CBC shows no leukocytosis, anemia.Slight thrombocytopenia, which patient has been border line before.   Re-evaluation. Patient is sleeping comfortably. Abdomen exam is benign. He reports improvement  in pain. Suspect this is MSK in nature. Patient stable for discharge at this time. Patient had ample opportunity for questions and discussion. All patient's questions were answered with full understanding. Strict return precautions discussed. Patient expresses understanding and agreement to plan.   Final Clinical Impressions(s) / ED Diagnoses   Final diagnoses:  Musculoskeletal pain  Acute right-sided thoracic back pain    ED Discharge Orders        Ordered    methocarbamol (ROBAXIN) 500 MG tablet  2 times daily PRN     01/02/18 1024       Maxwell Caul, PA-C 01/03/18 [id number]    [name], MD 01/05/18 1313

## 2018-01-02 NOTE — ED Triage Notes (Signed)
[facility] EMS[facility] of [facility] back pain at 10/10 which started 9pm last night , denied of fall. Alert and oriented x4. Pt. Was asleep upon on board EMS.

## 2018-01-02 NOTE — Discharge Instructions (Signed)
Take Robaxin as prescribed. This medication will make you drowsy so do not drive or drink alcohol when taking it.  Follow-up with your primary care doctor in the next 24-48 hours for further evaluation.   Return to the Emergency Department for any worsening pain, chest pain, difficulty breathing, abdominal pain, nausea/vomiting.

## 2018-01-02 NOTE — ED Notes (Signed)
[facility] contacted regarding transport.  

## 2018-01-02 NOTE — ED Notes (Signed)
Bed: [facility] Expected date:  Expected time:  Means of arrival:  Comments: 

## 2018-01-02 NOTE — ED Notes (Signed)
Patient aware that a urine sample is needed 

## 2018-01-06 ENCOUNTER — Encounter (HOSPITAL_COMMUNITY): Payer: Self-pay

## 2018-01-06 ENCOUNTER — Observation Stay (HOSPITAL_COMMUNITY)
Admission: EM | Admit: 2018-01-06 | Discharge: 2018-01-07 | Disposition: A | Discharge status: Nursing Home (Includes ICF) | Payer: Medicaid Other | Attending: Internal Medicine | Admitting: Internal Medicine

## 2018-01-06 ENCOUNTER — Emergency Department (HOSPITAL_COMMUNITY): Payer: Medicaid Other

## 2018-01-06 ENCOUNTER — Other Ambulatory Visit: Payer: Self-pay

## 2018-01-06 DIAGNOSIS — G7111 Myotonic muscular dystrophy: Secondary | ICD-10-CM | POA: Diagnosis present

## 2018-01-06 DIAGNOSIS — D696 Thrombocytopenia, unspecified: Secondary | ICD-10-CM | POA: Insufficient documentation

## 2018-01-06 DIAGNOSIS — K219 Gastro-esophageal reflux disease without esophagitis: Secondary | ICD-10-CM | POA: Diagnosis not present

## 2018-01-06 DIAGNOSIS — Z79899 Other long term (current) drug therapy: Secondary | ICD-10-CM | POA: Insufficient documentation

## 2018-01-06 DIAGNOSIS — E870 Hyperosmolality and hypernatremia: Secondary | ICD-10-CM | POA: Diagnosis not present

## 2018-01-06 DIAGNOSIS — L8990 Pressure ulcer of unspecified site, unspecified stage: Secondary | ICD-10-CM

## 2018-01-06 DIAGNOSIS — I10 Essential (primary) hypertension: Secondary | ICD-10-CM | POA: Insufficient documentation

## 2018-01-06 DIAGNOSIS — Z87891 Personal history of nicotine dependence: Secondary | ICD-10-CM | POA: Diagnosis not present

## 2018-01-06 DIAGNOSIS — Z23 Encounter for immunization: Secondary | ICD-10-CM | POA: Diagnosis not present

## 2018-01-06 DIAGNOSIS — N132 Hydronephrosis with renal and ureteral calculous obstruction: Secondary | ICD-10-CM | POA: Diagnosis not present

## 2018-01-06 DIAGNOSIS — N201 Calculus of ureter: Secondary | ICD-10-CM

## 2018-01-06 DIAGNOSIS — E785 Hyperlipidemia, unspecified: Secondary | ICD-10-CM | POA: Insufficient documentation

## 2018-01-06 DIAGNOSIS — I739 Peripheral vascular disease, unspecified: Secondary | ICD-10-CM | POA: Diagnosis present

## 2018-01-06 DIAGNOSIS — Z95820 Peripheral vascular angioplasty status with implants and grafts: Secondary | ICD-10-CM | POA: Diagnosis not present

## 2018-01-06 DIAGNOSIS — R109 Unspecified abdominal pain: Secondary | ICD-10-CM | POA: Diagnosis present

## 2018-01-06 DIAGNOSIS — Z86718 Personal history of other venous thrombosis and embolism: Secondary | ICD-10-CM | POA: Insufficient documentation

## 2018-01-06 DIAGNOSIS — Z66 Do not resuscitate: Secondary | ICD-10-CM | POA: Diagnosis not present

## 2018-01-06 LAB — COMPREHENSIVE METABOLIC PANEL WITH GFR
ALT: 17 U/L (ref 17–63)
AST: 32 U/L (ref 15–41)
Albumin: 3.5 g/dL (ref 3.5–5.0)
Alkaline Phosphatase: 93 U/L (ref 38–126)
Anion gap: 10 (ref 5–15)
BUN: 13 mg/dL (ref 6–20)
CO2: 28 mmol/L (ref 22–32)
Calcium: 10 mg/dL (ref 8.9–10.3)
Chloride: 109 mmol/L (ref 101–111)
Creatinine, Ser: 0.67 mg/dL (ref 0.61–1.24)
GFR calc Af Amer: >60 mL/min
GFR calc non Af Amer: >60 mL/min
Glucose, Bld: 115 mg/dL — ABNORMAL HIGH (ref 65–99)
Potassium: 2.8 mmol/L — ABNORMAL LOW (ref 3.5–5.1)
Sodium: 147 mmol/L — ABNORMAL HIGH (ref 135–145)
Total Bilirubin: 1.2 mg/dL (ref 0.3–1.2)
Total Protein: 6.2 g/dL — ABNORMAL LOW (ref 6.5–8.1)

## 2018-01-06 LAB — CBC
HCT: 41.1 % (ref 39.0–52.0)
Hemoglobin: 13.2 g/dL (ref 13.0–17.0)
MCH: 32.4 pg (ref 26.0–34.0)
MCHC: 32.1 g/dL (ref 30.0–36.0)
MCV: 100.7 fL — ABNORMAL HIGH (ref 78.0–100.0)
Platelets: 141 10*3/uL — ABNORMAL LOW (ref 150–400)
RBC: 4.08 MIL/uL — ABNORMAL LOW (ref 4.22–5.81)
RDW: 16 % — ABNORMAL HIGH (ref 11.5–15.5)
WBC: 4.4 10*3/uL (ref 4.0–10.5)

## 2018-01-06 LAB — URINALYSIS, ROUTINE W REFLEX MICROSCOPIC
Bilirubin Urine: NEGATIVE
Glucose, UA: NEGATIVE mg/dL
Ketones, ur: NEGATIVE mg/dL
Leukocytes, UA: NEGATIVE
Nitrite: NEGATIVE
Protein, ur: 100 mg/dL — AB
Specific Gravity, Urine: 1.017 (ref 1.005–1.030)
pH: 6 (ref 5.0–8.0)

## 2018-01-06 LAB — GLUCOSE, CAPILLARY: Glucose-Capillary: 76 mg/dL (ref 65–99)

## 2018-01-06 LAB — LIPASE, BLOOD: Lipase: 34 U/L (ref 11–51)

## 2018-01-06 MED ORDER — INFLUENZA VAC SPLIT QUAD 0.5 ML IM SUSY
0.5000 mL | PREFILLED_SYRINGE | INTRAMUSCULAR | Status: AC
  Administered 2018-01-07: 0.5 mL via INTRAMUSCULAR
  Filled 2018-01-06: qty 0.5

## 2018-01-06 MED ORDER — ONDANSETRON HCL 4 MG PO TABS: 4.0000 mg | ORAL_TABLET | Freq: Four times a day (QID) | ORAL | Status: DC | PRN

## 2018-01-06 MED ORDER — ACETAMINOPHEN 650 MG RE SUPP: 650.0000 mg | Freq: Four times a day (QID) | RECTAL | Status: DC | PRN

## 2018-01-06 MED ORDER — POTASSIUM CHLORIDE CRYS ER 20 MEQ PO TBCR
40.0000 meq | EXTENDED_RELEASE_TABLET | Freq: Once | ORAL | Status: AC
  Administered 2018-01-06: 40 meq via ORAL
  Filled 2018-01-06: qty 2

## 2018-01-06 MED ORDER — MIRTAZAPINE 15 MG PO TABS
15.0000 mg | ORAL_TABLET | Freq: Every day | ORAL | Status: DC
  Administered 2018-01-07: 15 mg via ORAL
  Filled 2018-01-06: qty 1

## 2018-01-06 MED ORDER — SODIUM CHLORIDE 0.9 % IJ SOLN
INTRAMUSCULAR | Status: AC
  Filled 2018-01-06: qty 50

## 2018-01-06 MED ORDER — DEXTROSE-NACL 5-0.45 % IV SOLN
INTRAVENOUS | Status: DC
  Administered 2018-01-07: via INTRAVENOUS

## 2018-01-06 MED ORDER — ONDANSETRON HCL 4 MG/2ML IJ SOLN: 4.0000 mg | Freq: Four times a day (QID) | INTRAMUSCULAR | Status: DC | PRN

## 2018-01-06 MED ORDER — IOPAMIDOL (ISOVUE-300) INJECTION 61%
INTRAVENOUS | Status: AC
  Administered 2018-01-06: 30 mL via ORAL
  Filled 2018-01-06: qty 30

## 2018-01-06 MED ORDER — IOPAMIDOL (ISOVUE-300) INJECTION 61%
INTRAVENOUS | Status: AC
  Administered 2018-01-06: 100 mL via INTRAVENOUS
  Filled 2018-01-06: qty 100

## 2018-01-06 MED ORDER — CIPROFLOXACIN IN D5W 400 MG/200ML IV SOLN
400.0000 mg | Freq: Two times a day (BID) | INTRAVENOUS | Status: DC
  Administered 2018-01-07 (×2): 400 mg via INTRAVENOUS
  Filled 2018-01-06 (×2): qty 200

## 2018-01-06 MED ORDER — ACETAMINOPHEN 325 MG PO TABS: 650.0000 mg | ORAL_TABLET | Freq: Four times a day (QID) | ORAL | Status: DC | PRN

## 2018-01-06 MED ORDER — SODIUM CHLORIDE 0.9 % IV BOLUS (SEPSIS)
1000.0000 mL | Freq: Once | INTRAVENOUS | Status: AC
  Administered 2018-01-06: 1000 mL via INTRAVENOUS

## 2018-01-06 NOTE — H&P (Signed)
History and Physical    Malik [name] ZOX:[id number] DOB: Jan 28, 1966 DOA: 01/06/2018  PCP: [name], MD  Patient coming from: Skilled nursing facility.  Chief Complaint: Abdominal pain.  HPI: Malik [name] is a 52 y.o. male with history of advanced muscular dystrophy with lower extremity weakness, peripheral arterial disease status post right sided axillofemoral bypass, hyperlipidemia, GERD presents to the ER with complaint of abdominal pain patient has been having abdominal pain for last 2 days with some nausea.  Denies any vomiting.  Has some constipation.  Patient also briefly had right flank pain.  No fever or chills.  ED Course: In the ER patient had a CAT scan done which shows bilateral ureteral stone.  The right one was measuring around 5 mm in the left one was around 2 mm.  Causing bilateral hydronephrosis.  On-call urologist Dr. Marlou Porch was consulted and at this time neurologist has recommended admission for observation and feels that the left ureteral stone would likely be passed.  No signs of infection or empiric antibiotics with Cipro has been started.  Review of Systems: As per HPI, rest all negative.   Past Medical History:  Diagnosis Date  . DVT (deep venous thrombosis) (HCC)    left brachial vein DVT 01/29/16  . Hiccups   . High blood pressure   . History of kidney stones   . Hypertension   . Myotonic muscular dystrophy [facility])     Past Surgical History:  Procedure Laterality Date  . AXILLARY-FEMORAL BYPASS GRAFT Right 03/07/2017   Procedure: RIGHT AXILLA-BIFEMORAL BYPASS GRAFT USING HEMASHIELD GOLD VASCULAR GRAFT;  Surgeon: [name], MD;  Location: [facility] OR;  Service: Vascular;  Laterality: Right;  . CHOLECYSTECTOMY    . ENDARTERECTOMY FEMORAL Left 03/07/2017   Procedure: ENDARTERECTOMY LEFT FEMORAL ARTERY ;  Surgeon: [name], MD;  Location: [facility] OR;  Service: Vascular;  Laterality: Left;  . ESOPHAGOGASTRODUODENOSCOPY N/A 05/10/2016   Procedure: ESOPHAGOGASTRODUODENOSCOPY (EGD);  Surgeon: [name], MD;  [facility];  Service: Endoscopy;  Laterality: N/A;  Please discuss sedation possibilities with Dr. [name]  . ESOPHAGOGASTRODUODENOSCOPY N/A 08/29/2017   Procedure: ESOPHAGOGASTRODUODENOSCOPY (EGD);  Surgeon: [name], MD;  [facility];  Service: Endoscopy;  Laterality: N/A;  . MULTIPLE TOOTH EXTRACTIONS  2007   Pt had all teeth pulled, has not yet gotten dentures.   Marland Kitchen PATCH ANGIOPLASTY Left 03/07/2017   Procedure: PATCH ANGIOPLASTY LEFT FEMORAL ARTERY USING XENOSURE BIOLOGIC PATCH;  Surgeon: [name], MD;  Location: [facility] OR;  Service: Vascular;  Laterality: Left;     reports that he quit smoking about 5 months ago. His smoking use included cigarettes. He started smoking about 34 years ago. He smoked 0.30 packs per day. he has never used smokeless tobacco. He reports that he does not drink alcohol or use drugs.  No Known Allergies  Family History  Problem Relation Age of Onset  . COPD Mother   . Diabetes Mother   . Breast cancer Mother   . Cancer Father   . Cancer Sister   . Muscular dystrophy Sister     Prior to Admission medications   Medication Sig Start Date End Date Taking? Authorizing Provider  atorvastatin (LIPITOR) 40 MG tablet Take 40 mg by mouth every evening.   Yes [provider]  fenofibrate (TRICOR) 145 MG tablet Take 145 mg by mouth daily.   Yes [provider]  methocarbamol (ROBAXIN) 500 MG tablet Take 1 tablet (500 mg  total) by mouth 2 (two) times daily as needed for muscle spasms. 01/02/18  Yes [name], MD  mirtazapine (REMERON) 15 MG tablet Take 15 mg by mouth at bedtime.   Yes [provider]  pantoprazole (PROTONIX) 40 MG tablet Take 40 mg by mouth daily.   Yes [provider]    Physical Exam: Vitals:   01/06/18 1709 01/06/18 2105 01/06/18 2217 01/06/18 2233  BP: 134/83 114/75 121/81   Pulse: (!) 54 (!) 51  (!) 55   Resp: (!) 21 18 16    Temp:   98.2 F (36.8 C)   TempSrc:   Oral   SpO2: 98% 98% 100%   Weight:      Height:    5\' 7"  (1.702 m)      Constitutional: Moderately built and poorly nourished. Vitals:   01/06/18 1709 01/06/18 2105 01/06/18 2217 01/06/18 2233  BP: 134/83 114/75 121/81   Pulse: (!) 54 (!) 51 (!) 55   Resp: (!) 21 18 16    Temp:   98.2 F (36.8 C)   TempSrc:   Oral   SpO2: 98% 98% 100%   Weight:      Height:    5\' 7"  (1.702 m)   Eyes: Anicteric no pallor. ENMT: No discharge from the ears eyes nose or mouth. Neck: No mass felt.  No neck rigidity. Respiratory: No rhonchi or crepitations. Cardiovascular: S1-S2 heard no murmurs appreciated. Abdomen: Soft nontender bowel sounds present. Musculoskeletal: No edema. Skin: No rash. Neurologic: At the time of my exam patient was sleeping but on awakening patient is alert awake oriented.  Has weakness of the lower extremities.  Moves upper extremities. Psychiatric: Appears normal.   Labs on Admission: I have personally reviewed following labs and imaging studies  CBC: Recent Labs  Lab 01/02/18 0733 01/06/18 1317  WBC 8.4 4.4  NEUTROABS 7.2  --   HGB 14.8 13.2  HCT 45.3 41.1  MCV 99.8 100.7*  PLT 124* 141*   Basic Metabolic Panel: Recent Labs  Lab 01/02/18 0812 01/06/18 1317  NA 148* 147*  K 3.5 2.8*  CL 111 109  CO2 29 28  GLUCOSE 111* 115*  BUN 15 13  CREATININE 0.62 0.67  CALCIUM 9.2 10.0   GFR: Estimated Creatinine Clearance: 80.8 mL/min (by C-G formula based on SCr of 0.67 mg/dL). Liver Function Tests: Recent Labs  Lab 01/02/18 0812 01/06/18 1317  AST 25 32  ALT 15* 17  ALKPHOS 77 93  BILITOT 1.0 1.2  PROT 5.8* 6.2*  ALBUMIN 3.4* 3.5   Recent Labs  Lab 01/02/18 0812 01/06/18 1317  LIPASE 25 34   No results for input(s): AMMONIA in the last 168 hours. Coagulation Profile: No results for input(s): INR, PROTIME in the last 168 hours. Cardiac Enzymes: No results for  input(s): CKTOTAL, CKMB, CKMBINDEX, TROPONINI in the last 168 hours. BNP (last 3 results) No results for input(s): PROBNP in the last 8760 hours. HbA1C: No results for input(s): HGBA1C in the last 72 hours. CBG: No results for input(s): GLUCAP in the last 168 hours. Lipid Profile: No results for input(s): CHOL, HDL, LDLCALC, TRIG, CHOLHDL, LDLDIRECT in the last 72 hours. Thyroid Function Tests: No results for input(s): TSH, T4TOTAL, FREET4, T3FREE, THYROIDAB in the last 72 hours. Anemia Panel: No results for input(s): VITAMINB12, FOLATE, FERRITIN, TIBC, IRON, RETICCTPCT in the last 72 hours. Urine analysis:    Component Value Date/Time   COLORURINE AMBER (A) 01/06/2018 1301   APPEARANCEUR HAZY (A) 01/06/2018 1301  LABSPEC 1.017 01/06/2018 1301   PHURINE 6.0 01/06/2018 1301   GLUCOSEU NEGATIVE 01/06/2018 1301   HGBUR LARGE (A) 01/06/2018 1301   BILIRUBINUR NEGATIVE 01/06/2018 1301   KETONESUR NEGATIVE 01/06/2018 1301   PROTEINUR 100 (A) 01/06/2018 1301   UROBILINOGEN 1.0 11/22/2014 1132   NITRITE NEGATIVE 01/06/2018 1301   LEUKOCYTESUR NEGATIVE 01/06/2018 1301   Sepsis Labs: @LABRCNTIP (procalcitonin:4,lacticidven:4) )No results found for this or any previous visit (from the past 240 hour(s)).   Radiological Exams on Admission: Ct Abdomen Pelvis W Contrast  Result Date: 01/06/2018 CLINICAL DATA:  Per EMS- Patient is a resident of [facility]. Patient c/o lower abdominal pain, constipation, and no urine output since yesterday. EXAM: CT ABDOMEN AND PELVIS WITH CONTRAST TECHNIQUE: Multidetector CT imaging of the abdomen and pelvis was performed using the standard protocol following bolus administration of intravenous contrast. CONTRAST:  ISOVUE-300 IOPAMIDOL (ISOVUE-300) INJECTION 61%, 30mL ISOVUE-300 IOPAMIDOL (ISOVUE-300) INJECTION 61% COMPARISON:  01/27/2017 FINDINGS: Lower chest: There are linear and discoid type opacities at the lung bases consistent with  atelectasis/scarring. No acute findings. Hepatobiliary: No focal liver abnormality is seen. Status post cholecystectomy. No biliary dilatation. Pancreas: Unremarkable. No pancreatic ductal dilatation or surrounding inflammatory changes. Spleen: Normal in size without focal abnormality. Adrenals/Urinary Tract: No adrenal masses. Moderate, right greater than left, hydronephrosis. On the right, this is due a 5 mm stone in the mid right ureter. On the left, this is due to 2 adjacent 2 mm stones at the ureterovesicular junction. 12 mm cyst arises from the upper pole of the left kidney. No other renal masses. No intrarenal stones. There is symmetric renal enhancement and excretion. No bladder mass or wall thickening. Stomach/Bowel: Stomach is within normal limits. Appendix appears normal. No evidence of bowel wall thickening, distention, or inflammatory changes. Vascular/Lymphatic: There is significant atherosclerotic disease. Plaque is noted along the aorta. There is plaque and thrombus occluding the left common iliac artery. The right external iliac artery is occluded with thrombus. The left external iliac artery is reconstituted. There is a femoral to femoral arterial vascular graft as well as a right axillary to femoral graft, which appear patent. No pathologically enlarged lymph nodes. Reproductive: Unremarkable. Other: No abdominal wall hernia.  No ascites. Musculoskeletal: No acute or significant osseous findings. IMPRESSION: 1. Bilateral ureteral stones, a 5 mm stone in the right mid ureter in 2 adjacent 2 mm stones in the distal left ureter at the ureterovesicular junction. These cause moderate, right greater than left, bilateral hydronephrosis. 2. No other acute abnormalities within the abdomen or pelvis. Specifically, no bowel obstruction or inflammation. No significant increase in colonic stool. Electronically Signed   By: Amie Portland M.D.   On: 01/06/2018 18:37      Assessment/Plan Principal  Problem:   Ureteral stone with hydronephrosis Active Problems:   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HCC)   PAD (peripheral artery disease) (HCC)   Abdominal pain   Pressure injury of skin   Hypernatremia   Bilateral ureteral calculi    1. Bilateral ureteral stone with bilateral hydronephrosis -appreciate urology consult and recommendations.  Patient is placed on Cipro and will keep patient n.p.o. past midnight.  Gentle hydration.  Pain relief medication if needed.  As per the urology note patient likely with possible left ureteral stone.  Follow metabolic panel. 2. Mild hypernatremia -we will gently hydrate with D5 half-normal saline and follow metabolic panel.  Could be from dehydration or decreased free fluid intake. 3. Peripheral artery disease status post axillofemoral bypass -presently  n.p.o. 4. History of hyperlipidemia presently n.p.o. 5. History of GERD. 6. Thrombocytopenia appears to be chronic. 7. Muscular dystrophy.   DVT prophylaxis: SCDs. Code Status: DNR. Family Communication: No family at the bedside. Disposition Plan: Skilled nursing facility. Consults called: Neurologist. Admission status: Observation.   Eduard ClosArshad N Uday Jantz MD [facility] Pager [phone].  If 7PM-7AM, please contact night-coverage [email or web address] Password [email or web address]  01/06/2018, 11:10 PM

## 2018-01-06 NOTE — Consult Note (Signed)
I have been asked to see the patient by Dr. Benjiman Core, for evaluation and management of bilateral obstructing stones.  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  Evaluation included a CAT scan and a urinalysis which demonstrated noninfectious bilateral ureteral stones.  The left stone was located at the UVJ and seems very small and easily passable, the right stone was in the mid ureter and approximately 5-6 mm.  The patient is complaining of epigastric pain, no complaints of flank pain or voiding symptoms.  He denies any fevers or chills.  He denies any hematuria.  He did endorse right-sided flank pain on Monday, this is since resolved.  The patient has passed stones in the past.  The patient is immobile, can lift his left arm slightly.  He is dysarthric.  He lives in a skilled nursing facility.  His aunt is his DURABLE POWER OF ATTORNEY.  Review of systems: A 12 point comprehensive review of systems was obtained and is negative unless otherwise stated in the history of present illness.  Patient Active Problem List   Diagnosis Date Noted  . PAD (peripheral artery disease) (HCC) 06/07/2016  . Leg pain, bilateral 06/02/2016  . Esophageal obstruction due to food impaction   . DVT of upper extremity (deep vein thrombosis) (HCC) 01/29/2016  . Residence in long term care facility 03/04/2014  . EKG abnormality 11/09/2013  . Health care maintenance 06/22/2013  . Epidermal cyst 06/22/2013  . Skin lesion of hand 06/22/2013  . 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 Hypertension, benign 01/20/2012  . Unspecified vitamin D deficiency 08/05/2011  . Vitamin B 12 deficiency 07/14/2011  . 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 Muscle weakness (generalized) 04/16/2011  . Weight finding 04/16/2011  . Tobacco user 04/16/2011    No current facility-administered medications on file  prior to encounter.    Current Outpatient Medications on File Prior to Encounter  Medication Sig Dispense Refill  . atorvastatin (LIPITOR) 40 MG tablet Take 40 mg by mouth every evening.    . fenofibrate (TRICOR) 145 MG tablet Take 145 mg by mouth daily.    . methocarbamol (ROBAXIN) 500 MG tablet Take 1 tablet (500 mg total) by mouth 2 (two) times daily as needed for muscle spasms. 12 tablet 0  . mirtazapine (REMERON) 15 MG tablet Take 15 mg by mouth at bedtime.    . pantoprazole (PROTONIX) 40 MG tablet Take 40 mg by mouth daily.      Past Medical History:  Diagnosis Date  . DVT (deep venous thrombosis) (HCC)    left brachial vein DVT 01/29/16  . Hiccups   . High blood pressure   . History of kidney stones   . Hypertension   . Myotonic muscular dystrophy [facility])     Past Surgical History:  Procedure Laterality Date  . AXILLARY-FEMORAL BYPASS GRAFT Right 03/07/2017   Procedure: RIGHT AXILLA-BIFEMORAL BYPASS GRAFT USING HEMASHIELD GOLD VASCULAR GRAFT;  Surgeon: [name], MD;  Location: [facility] OR;  Service: Vascular;  Laterality: Right;  . CHOLECYSTECTOMY    . ENDARTERECTOMY FEMORAL Left 03/07/2017   Procedure: ENDARTERECTOMY LEFT FEMORAL ARTERY ;  Surgeon: [name], MD;  Location: [facility] OR;  Service: Vascular;  Laterality: Left;  . ESOPHAGOGASTRODUODENOSCOPY N/A 05/10/2016   Procedure: ESOPHAGOGASTRODUODENOSCOPY (EGD);  Surgeon: [name], MD;  Location: [facility];  Service: Endoscopy;  Laterality: N/A;  Please discuss sedation possibilities with Dr. [name]  .  ESOPHAGOGASTRODUODENOSCOPY N/A 08/29/2017   Procedure: ESOPHAGOGASTRODUODENOSCOPY (EGD);  Surgeon: Charna ElizabethMann, Jyothi, MD;  [facility];  Service: Endoscopy;  Laterality: N/A;  . MULTIPLE TOOTH EXTRACTIONS  2007   Pt had all teeth pulled, has not yet gotten dentures.   Marland Kitchen. PATCH ANGIOPLASTY Left 03/07/2017   Procedure: PATCH ANGIOPLASTY LEFT FEMORAL ARTERY USING XENOSURE BIOLOGIC PATCH;  Surgeon: Sherren KernsFields, Charles  E, MD;  [facility];  Service: Vascular;  Laterality: Left;    Social History   Tobacco Use  . Smoking status: Former Smoker    Packs/day: 0.30    Types: Cigarettes    Start date: 11/02/1983    Last attempt to quit: 08/01/2017    Years since quitting: 0.4  . Smokeless tobacco: Never Used  . Tobacco comment: 4-5 cigs per day  Substance Use Topics  . Alcohol use: No  . Drug use: No    Comment: Pt smokes MJ ocassinally.  Denies ever using IV drugs.     Family History  Problem Relation Age of Onset  . COPD Mother   . Diabetes Mother   . Breast cancer Mother   . Cancer Father   . Cancer Sister   . Muscular dystrophy Sister     PE: Vitals:   01/06/18 1300 01/06/18 1400 01/06/18 1500 01/06/18 1709  BP: (!) 146/85 117/81 121/81 134/83  Pulse: 67 66 60 (!) 54  Resp: 19 13 (!) 22 (!) 21  Temp:      TempSrc:      SpO2: 100% 100% 99% 98%  Weight:      Height:       Patient appears to be in no acute distress cachectic patient is alert and oriented x3 Atraumatic normocephalic head No cervical or supraclavicular lymphadenopathy appreciated No increased work of breathing, no audible wheezes/rhonchi Regular sinus rhythm/rate Abdomen is soft, nontender, nondistended, no CVA or suprapubic tenderness Lower extremities are symmetric without appreciable edema Grossly neurologically intact, significantly weakened, sensation is intact No identifiable skin lesions  Recent Labs    01/06/18 1317  WBC 4.4  HGB 13.2  HCT 41.1   Recent Labs    01/06/18 1317  NA 147*  K 2.8*  CL 109  CO2 28  GLUCOSE 115*  BUN 13  CREATININE 0.67  CALCIUM 10.0   No results for input(s): LABPT, INR in the last 72 hours. No results for input(s): LABURIN in the last 72 hours. Results for orders placed or performed during the hospital encounter of 03/07/17  MRSA PCR Screening     Status: None   Collection Time: 03/07/17  2:16 PM  Result Value Ref Range Status   MRSA by PCR NEGATIVE NEGATIVE  Final    Comment:        The GeneXpert MRSA Assay (FDA approved for NASAL specimens only), is one component of a comprehensive MRSA colonization surveillance program. It is not intended to diagnose MRSA infection nor to guide or monitor treatment for MRSA infections.     Imaging: I have independently reviewed the patient's CT scan which demonstrates small stone at the left UVJ, nearly in the patient's bladder, with mild hydroureteronephrosis.  There is some thickening of the urothelium on the side which is concerning.  However, the patient does not have any evidence of infection clinically.  The right stone is in the mid ureter with proximal hydroureteronephrosis.  Imp: The patient has bilateral obstructing stones.  The stone on the left is small and nearly into the patient's bladder, suspect he  will pass this in the short-term.  He does have some thickening of that upper urinary tract which is concerning for infection, although his urine analysis today, his white count, and his normal vital signs would suggest otherwise.  The stone on the right is mid ureter and is not likely to pass in the near future.  The patient is making urine, his renal function is normal.  His pain is well controlled.  Recommendations: I recommended admission to the hospital service given his associated comorbidities for observation.  Given the patient's significant weakness he is not a good candidate for general anesthesia.  Further, he is otherwise asymptomatic with no evidence of infection.  I suspect he will pass the stone on the left which will prevent him from developing renal failure.  He can then be discharged home with close follow-up in the urology clinic to ensure that he is ultimately able to pass the stone on the right.  Admission for observation IV hydration Strict I's and O's N.p.o. past midnight Recommend starting ciprofloxacin 500 mg twice daily for empiric UTI coverage.  I will check with the  patient in the morning.  Thank you for involving me in this patient's care, you I will continue to follow along  .Berniece Salines W

## 2018-01-06 NOTE — ED Provider Notes (Signed)
[facility]-EMERGENCY DEPT Provider Note   CSN: [id number] Arrival date & time: 01/06/18  1230     History   Chief Complaint Chief Complaint  Patient presents with  . Abdominal Pain  . Constipation  . no urine output    HPI Malik [name] is a 52 y.o. male who presents for Lowert abdominal pain and no urine OP since yesterday. He has  has a past medical history of DVT (deep venous thrombosis) (HCC), Hiccups, High blood pressure, History of kidney stones, Hypertension, and 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HCC). He is a very poor historian. He presents from Long term care facility. He had a normal BM yesterday. He had a small amount of urine on the sheets with blood this morning. He vomited once.  Patient is not ambulatory due to chronic medical condition.  He was able to urinate and provide a urine sample here.   HPI  Past Medical History:  Diagnosis Date  . DVT (deep venous thrombosis) (HCC)    left brachial vein DVT 01/29/16  . Hiccups   . High blood pressure   . History of kidney stones   . Hypertension   . Myotonic muscular dystrophy [facility](HCC)     Patient Active Problem List   Diagnosis Date Noted  . PAD (peripheral artery disease) (HCC) 06/07/2016  . Leg pain, bilateral 06/02/2016  . Esophageal obstruction due to food impaction   . DVT of upper extremity (deep vein thrombosis) (HCC) 01/29/2016  . Residence in long term care facility 03/04/2014  . EKG abnormality 11/09/2013  . Health care maintenance 06/22/2013  . Epidermal cyst 06/22/2013  . Skin lesion of hand 06/22/2013  . 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 Hypertension, benign 01/20/2012  . Unspecified vitamin D deficiency 08/05/2011  . Vitamin B 12 deficiency 07/14/2011  . 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 Muscle weakness (generalized) 04/16/2011  . Weight finding 04/16/2011  . Tobacco user 04/16/2011    Past Surgical History:  Procedure Laterality Date  . AXILLARY-FEMORAL BYPASS  GRAFT Right 03/07/2017   Procedure: RIGHT AXILLA-BIFEMORAL BYPASS GRAFT USING HEMASHIELD GOLD VASCULAR GRAFT;  Surgeon: Sherren KernsFields, Charles E, MD;  [facility];  Service: Vascular;  Laterality: Right;  . CHOLECYSTECTOMY    . ENDARTERECTOMY FEMORAL Left 03/07/2017   Procedure: ENDARTERECTOMY LEFT FEMORAL ARTERY ;  Surgeon: Sherren KernsFields, Charles E, MD;  [facility];  Service: Vascular;  Laterality: Left;  . ESOPHAGOGASTRODUODENOSCOPY N/A 05/10/2016   Procedure: ESOPHAGOGASTRODUODENOSCOPY (EGD);  Surgeon: Sherrilyn RistHenry L Danis III, MD;  [facility];  Service: Endoscopy;  Laterality: N/A;  Please discuss sedation possibilities with Dr. Myrtie Neitheranis  . ESOPHAGOGASTRODUODENOSCOPY N/A 08/29/2017   Procedure: ESOPHAGOGASTRODUODENOSCOPY (EGD);  Surgeon: Charna ElizabethMann, Jyothi, MD;  [facility];  Service: Endoscopy;  Laterality: N/A;  . MULTIPLE TOOTH EXTRACTIONS  2007   Pt had all teeth pulled, has not yet gotten dentures.   Marland Kitchen. PATCH ANGIOPLASTY Left 03/07/2017   Procedure: PATCH ANGIOPLASTY LEFT FEMORAL ARTERY USING XENOSURE BIOLOGIC PATCH;  Surgeon: Sherren KernsFields, Charles E, MD;  [facility];  Service: Vascular;  Laterality: Left;       Home Medications    Prior to Admission medications   Medication Sig Start Date End Date Taking? Authorizing Provider  atorvastatin (LIPITOR) 40 MG tablet Take 40 mg by mouth every evening.   Yes [provider]  fenofibrate (TRICOR) 145 MG tablet Take 145 mg by mouth daily.   Yes [provider]  methocarbamol (ROBAXIN) 500 MG tablet Take 1 tablet (500  mg total) by mouth 2 (two) times daily as needed for muscle spasms. 01/02/18  Yes [name], MD  mirtazapine (REMERON) 15 MG tablet Take 15 mg by mouth at bedtime.   Yes [provider]  pantoprazole (PROTONIX) 40 MG tablet Take 40 mg by mouth daily.   Yes [provider]    Family History Family History  Problem Relation Age of Onset  . COPD Mother   . Diabetes Mother   . Breast  cancer Mother   . Cancer Father   . Cancer Sister   . Muscular dystrophy Sister     Social History Social History   Tobacco Use  . Smoking status: Former Smoker    Packs/day: 0.30    Types: Cigarettes    Start date: 11/02/1983    Last attempt to quit: 08/01/2017    Years since quitting: 0.4  . Smokeless tobacco: Never Used  . Tobacco comment: 4-5 cigs per day  Substance Use Topics  . Alcohol use: No  . Drug use: No    Comment: Pt smokes MJ ocassinally.  Denies ever using IV drugs.      Allergies   Patient has no known allergies.   Review of Systems Review of Systems Ten systems reviewed and are negative for acute change, except as noted in the HPI.    Physical Exam Updated Vital Signs BP 117/81 (BP Location: Right Arm)   Pulse 66   Temp 97.7 F (36.5 C) (Oral)   Resp 13   Ht 5\' 7"  (1.702 m)   Wt 53.5 kg (118 lb)   SpO2 100%   BMI 18.48 kg/m   Physical Exam  Constitutional: He is oriented to person, place, and time. He appears well-developed and well-nourished. No distress.  HENT:  Head: Normocephalic and atraumatic.  Eyes: Conjunctivae are normal. No scleral icterus.  Neck: Normal range of motion. Neck supple.  Cardiovascular: Normal rate, regular rhythm and normal heart sounds.  Pulmonary/Chest: Effort normal and breath sounds normal. No respiratory distress.  Abdominal: Soft. There is tenderness in the suprapubic area. There is no CVA tenderness.  Tenderness palpation suprapubic region  Musculoskeletal: He exhibits no edema.  Neurological: He is alert and oriented to person, place, and time.  Skin: Skin is warm and dry. He is not diaphoretic.  Psychiatric: His behavior is normal.  Nursing note and vitals reviewed.    ED Treatments / Results  Labs (all labs ordered are listed, but only abnormal results are displayed) Labs Reviewed  COMPREHENSIVE METABOLIC PANEL - Abnormal; Notable for the following components:      Result Value   Sodium 147 (*)     Potassium 2.8 (*)    Glucose, Bld 115 (*)    Total Protein 6.2 (*)    All other components within normal limits  CBC - Abnormal; Notable for the following components:   RBC 4.08 (*)    MCV 100.7 (*)    RDW 16.0 (*)    Platelets 141 (*)    All other components within normal limits  URINALYSIS, ROUTINE W REFLEX MICROSCOPIC - Abnormal; Notable for the following components:   Color, Urine AMBER (*)    APPearance HAZY (*)    Hgb urine dipstick LARGE (*)    Protein, ur 100 (*)    Bacteria, UA RARE (*)    Squamous Epithelial / LPF 0-5 (*)    All other components within normal limits  LIPASE, BLOOD    EKG  EKG Interpretation None  Radiology No results found.  Procedures Procedures (including critical care time)  Medications Ordered in ED Medications - No data to display   Initial Impression / Assessment and Plan / ED Course  I have reviewed the triage vital signs and the nursing notes.  Pertinent labs & imaging results that were available during my care of the patient were reviewed by me and considered in my medical decision making (see chart for details).  Clinical Course as of Jan 07 1456  Fri Jan 06, 2018  1456 Potassium: (!) 2.8 [AH]    Clinical Course User Index [AH] Arthor Captain, PA-C    CT scan reveals bilateral obstructing stone.  Patient has urinated several times in the bed and has foul odor of urine suggestive of potential urinary tract infection.  I have sent culture and sensitivity on the patient's urine.  I have discussed the case with Dr. Marlou Porch who is seen the patient here and would like the patient to be observed.  He asked for medical admission.  No elevation in the patient's creatinine no evidence of urinary retention.  He is stable throughout his emergency department visit without fevers or hemodynamic instability.  Final Clinical Impressions(s) / ED Diagnoses   Final diagnoses:  Bilateral ureteral calculi    ED Discharge Orders    None        Arthor Captain, PA-C 01/06/18 2304    Benjiman Core, MD 01/07/18 0030

## 2018-01-06 NOTE — ED Notes (Signed)
ED TO INPATIENT HANDOFF REPORT  Name/Age/Gender Malik [name] 52 y.o. male  Code Status Code Status History    Date Active Date Inactive Code Status Order ID Comments User Context   03/07/2017 13:40 03/11/2017 16:42 DNR [id number]  Gabriel Earing, PA-C Inpatient   03/07/2017 13:40 03/07/2017 13:40 Full Code [id number]  Gabriel Earing, PA-C Inpatient   05/26/2013 22:44 05/28/2013 20:49 DNR [id number]  Nolon Rod, DO Inpatient   05/26/2013 21:18 05/26/2013 22:44 DNR [id number]  Nolon Rod, DO ED    Questions for Most Recent Historical Code Status (Order [id number])    Question Answer Comment   In the event of cardiac or respiratory ARREST Do not call a "code blue"    In the event of cardiac or respiratory ARREST Do not perform Intubation, CPR, defibrillation or ACLS    In the event of cardiac or respiratory ARREST Use medication by any route, position, wound care, and other measures to relive pain and suffering. May use oxygen, suction and manual treatment of airway obstruction as needed for comfort.       Home/SNF/Other Skilled nursing facility  Chief Complaint abd pain / constipation   Level of Care/Admitting Diagnosis ED Disposition    ED Disposition Condition Comment   Admit  Hospital Area: [facility] [[id number]]  Level of Care: Telemetry [5]  Admit to tele based on following criteria: Monitor QTC interval  Diagnosis: Abdominal pain [299242]  Admitting Physician: Rise Patience [phone]  Attending Physician: Rise Patience [phone]  PT Class (Do Not Modify): Observation [104]  PT Acc Code (Do Not Modify): Observation [10022]       Medical History Past Medical History:  Diagnosis Date  . DVT (deep venous thrombosis) (HCC)    left brachial vein DVT 01/29/16  . Hiccups   . High blood pressure   . History of kidney stones   . Hypertension   . 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[place])     Allergies No Known Allergies  IV  Location/Drains/Wounds Patient Lines/Drains/Airways Status   Active Line/Drains/Airways    Name:   Placement date:   Placement time:   Site:   Days:   Peripheral IV 01/02/18 Right Hand   01/02/18    0752    Hand   4   Peripheral IV 01/06/18 Right Wrist   01/06/18    1600    Wrist   less than 1   Incision (Closed) 03/07/17 Chest Right   03/07/17    0859     305   Incision (Closed) 03/07/17 Groin Right   03/07/17    0859     305   Incision (Closed) 03/07/17 Groin Left   03/07/17    0859     305          Labs/Imaging Results for orders placed or performed during the hospital encounter of 01/06/18 (from the past 48 hour(s))  Urinalysis, Routine w reflex microscopic     Status: Abnormal   Collection Time: 01/06/18  1:01 PM  Result Value Ref Range   Color, Urine AMBER (A) YELLOW    Comment: BIOCHEMICALS MAY BE AFFECTED BY COLOR   APPearance HAZY (A) CLEAR   Specific Gravity, Urine 1.017 1.005 - 1.030   pH 6.0 5.0 - 8.0   Glucose, UA NEGATIVE NEGATIVE mg/dL   Hgb urine dipstick LARGE (A) NEGATIVE   Bilirubin Urine NEGATIVE NEGATIVE   Ketones, ur NEGATIVE NEGATIVE mg/dL   Protein, ur 100 (A) NEGATIVE  mg/dL   Nitrite NEGATIVE NEGATIVE   Leukocytes, UA NEGATIVE NEGATIVE   RBC / HPF TOO NUMEROUS TO COUNT 0 - 5 RBC/hpf   WBC, UA 6-30 0 - 5 WBC/hpf   Bacteria, UA RARE (A) NONE SEEN   Squamous Epithelial / LPF 0-5 (A) NONE SEEN   Mucus PRESENT     Comment: Performed at [facility], [place] [address] [place], [place] [address]  Lipase, blood     Status: None   Collection Time: 01/06/18  1:17 PM  Result Value Ref Range   Lipase 34 11 - 51 U/L    Comment: Performed at [facility], [place] [address] [place], [place] [address]  Comprehensive metabolic panel     Status: Abnormal   Collection Time: 01/06/18  1:17 PM  Result Value Ref Range   Sodium 147 (H) 135 - 145 mmol/L   Potassium 2.8 (L) 3.5 - 5.1 mmol/L   Chloride 109 101 - 111 mmol/L   CO2 28 22 -  32 mmol/L   Glucose, Bld 115 (H) 65 - 99 mg/dL   BUN 13 6 - 20 mg/dL   Creatinine, Ser 0.67 0.61 - 1.24 mg/dL   Calcium 10.0 8.9 - 10.3 mg/dL   Total Protein 6.2 (L) 6.5 - 8.1 g/dL   Albumin 3.5 3.5 - 5.0 g/dL   AST 32 15 - 41 U/L   ALT 17 17 - 63 U/L   Alkaline Phosphatase 93 38 - 126 U/L   Total Bilirubin 1.2 0.3 - 1.2 mg/dL   GFR calc non Af Amer >60 >60 mL/min   GFR calc Af Amer >60 >60 mL/min    Comment: (NOTE) The eGFR has been calculated using the CKD EPI equation. This calculation has not been validated in all clinical situations. eGFR's persistently <60 mL/min signify possible Chronic Kidney Disease.    Anion gap 10 5 - 15    Comment: Performed at [facility], [place] [address] [place], [place] [address]  CBC     Status: Abnormal   Collection Time: 01/06/18  1:17 PM  Result Value Ref Range   WBC 4.4 4.0 - 10.5 K/uL   RBC 4.08 (L) 4.22 - 5.81 MIL/uL   Hemoglobin 13.2 13.0 - 17.0 g/dL   HCT 41.1 39.0 - 52.0 %   MCV 100.7 (H) 78.0 - 100.0 fL   MCH 32.4 26.0 - 34.0 pg   MCHC 32.1 30.0 - 36.0 g/dL   RDW 16.0 (H) 11.5 - 15.5 %   Platelets 141 (L) 150 - 400 K/uL    Comment: Performed at [facility], [place] [address] [place], [place] [address]   Ct Abdomen Pelvis W Contrast  Result Date: 01/06/2018 CLINICAL DATA:  Per EMS- Patient is a resident of [facility]. Patient c/o lower abdominal pain, constipation, and no urine output since yesterday. EXAM: CT ABDOMEN AND PELVIS WITH CONTRAST TECHNIQUE: Multidetector CT imaging of the abdomen and pelvis was performed using the standard protocol following bolus administration of intravenous contrast. CONTRAST:  151m ISOVUE-300 IOPAMIDOL (ISOVUE-300) INJECTION 61%, 338mISOVUE-300 IOPAMIDOL (ISOVUE-300) INJECTION 61% COMPARISON:  01/27/2017 FINDINGS: Lower chest: There are linear and discoid type opacities at the lung bases consistent with atelectasis/scarring. No acute findings.  Hepatobiliary: No focal liver abnormality is seen. Status post cholecystectomy. No biliary dilatation. Pancreas: Unremarkable. No pancreatic ductal dilatation or surrounding inflammatory changes. Spleen: Normal in size without focal abnormality. Adrenals/Urinary Tract: No adrenal masses. Moderate, right greater than  left, hydronephrosis. On the right, this is due a 5 mm stone in the mid right ureter. On the left, this is due to 2 adjacent 2 mm stones at the ureterovesicular junction. 12 mm cyst arises from the upper pole of the left kidney. No other renal masses. No intrarenal stones. There is symmetric renal enhancement and excretion. No bladder mass or wall thickening. Stomach/Bowel: Stomach is within normal limits. Appendix appears normal. No evidence of bowel wall thickening, distention, or inflammatory changes. Vascular/Lymphatic: There is significant atherosclerotic disease. Plaque is noted along the aorta. There is plaque and thrombus occluding the left common iliac artery. The right external iliac artery is occluded with thrombus. The left external iliac artery is reconstituted. There is a femoral to femoral arterial vascular graft as well as a right axillary to femoral graft, which appear patent. No pathologically enlarged lymph nodes. Reproductive: Unremarkable. Other: No abdominal wall hernia.  No ascites. Musculoskeletal: No acute or significant osseous findings. IMPRESSION: 1. Bilateral ureteral stones, a 5 mm stone in the right mid ureter in 2 adjacent 2 mm stones in the distal left ureter at the ureterovesicular junction. These cause moderate, right greater than left, bilateral hydronephrosis. 2. No other acute abnormalities within the abdomen or pelvis. Specifically, no bowel obstruction or inflammation. No significant increase in colonic stool. Electronically Signed   By: Lajean Manes M.D.   On: 01/06/2018 18:37    Pending Labs Unresulted Labs (From admission, onward)   Start     Ordered    01/06/18 1852  Urine Culture  STAT,   STAT     01/06/18 1851      Vitals/Pain Today's Vitals   01/06/18 1400 01/06/18 1500 01/06/18 1709 01/06/18 2105  BP: 117/81 121/81 134/83 114/75  Pulse: 66 60 (!) 54 (!) 51  Resp: 13 (!) 22 (!) 21 18  Temp:      TempSrc:      SpO2: 100% 99% 98% 98%  Weight:      Height:      PainSc:        Isolation Precautions No active isolations  Medications Medications  sodium chloride 0.9 % injection (not administered)  Influenza vac split quadrivalent PF (FLUARIX) injection 0.5 mL (not administered)  sodium chloride 0.9 % bolus 1,000 mL (0 mLs Intravenous Stopped 01/06/18 1856)  potassium chloride SA (K-DUR,KLOR-CON) CR tablet 40 mEq (40 mEq Oral Given 01/06/18 1617)  iopamidol (ISOVUE-300) 61 % injection (30 mLs Oral Contrast Given 01/06/18 1609)  iopamidol (ISOVUE-300) 61 % injection (100 mLs Intravenous Contrast Given 01/06/18 1816)    Mobility non-ambulatory, does have a wheel chair but does not use it often.

## 2018-01-06 NOTE — ED Triage Notes (Signed)
Per EMS- Patient is a resident of [facility]. Patient c/o lower abdominal pain, constipation, and no urine output since yesterday.

## 2018-01-06 NOTE — ED Notes (Signed)
Bed: WA21 Expected date:  Expected time:  Means of arrival:  Comments: Triage 2 per [facility]

## 2018-01-07 DIAGNOSIS — N132 Hydronephrosis with renal and ureteral calculous obstruction: Secondary | ICD-10-CM | POA: Diagnosis not present

## 2018-01-07 LAB — CBC
HCT: 35.4 % — ABNORMAL LOW (ref 39.0–52.0)
Hemoglobin: 11.5 g/dL — ABNORMAL LOW (ref 13.0–17.0)
MCH: 32.3 pg (ref 26.0–34.0)
MCHC: 32.5 g/dL (ref 30.0–36.0)
MCV: 99.4 fL (ref 78.0–100.0)
Platelets: 125 10*3/uL — ABNORMAL LOW (ref 150–400)
RBC: 3.56 MIL/uL — ABNORMAL LOW (ref 4.22–5.81)
RDW: 15.8 % — ABNORMAL HIGH (ref 11.5–15.5)
WBC: 4.8 10*3/uL (ref 4.0–10.5)

## 2018-01-07 LAB — BASIC METABOLIC PANEL
Anion gap: 5 (ref 5–15)
BUN: 7 mg/dL (ref 6–20)
CO2: 28 mmol/L (ref 22–32)
Calcium: 8.7 mg/dL — ABNORMAL LOW (ref 8.9–10.3)
Chloride: 111 mmol/L (ref 101–111)
Creatinine, Ser: 0.44 mg/dL — ABNORMAL LOW (ref 0.61–1.24)
GFR calc Af Amer: >60 mL/min (ref >60)
GFR calc non Af Amer: >60 mL/min (ref >60)
Glucose, Bld: 89 mg/dL (ref 65–99)
Potassium: 3.1 mmol/L — ABNORMAL LOW (ref 3.5–5.1)
Sodium: 144 mmol/L (ref 135–145)

## 2018-01-07 LAB — HIV ANTIBODY (ROUTINE TESTING W REFLEX): HIV Screen 4th Generation wRfx: NONREACTIVE

## 2018-01-07 LAB — GLUCOSE, CAPILLARY: Glucose-Capillary: 87 mg/dL (ref 65–99)

## 2018-01-07 LAB — MRSA PCR SCREENING: MRSA by PCR: NEGATIVE

## 2018-01-07 MED ORDER — LACTATED RINGERS IV BOLUS (SEPSIS)
1000.0000 mL | Freq: Once | INTRAVENOUS | Status: AC
  Administered 2018-01-07: 1000 mL via INTRAVENOUS

## 2018-01-07 MED ORDER — POTASSIUM CHLORIDE CRYS ER 20 MEQ PO TBCR
40.0000 meq | EXTENDED_RELEASE_TABLET | Freq: Once | ORAL | Status: AC
  Administered 2018-01-07: 40 meq via ORAL
  Filled 2018-01-07: qty 2

## 2018-01-07 MED ORDER — HYDROCODONE-ACETAMINOPHEN 5-325 MG PO TABS: 1.0000 | ORAL_TABLET | Freq: Four times a day (QID) | ORAL | 0 refills | Status: DC | PRN

## 2018-01-07 MED ORDER — MAGNESIUM OXIDE 400 MG PO TABS: 400.0000 mg | ORAL_TABLET | Freq: Every day | ORAL | 0 refills | Status: AC

## 2018-01-07 MED ORDER — CIPROFLOXACIN HCL 500 MG PO TABS: 500.0000 mg | ORAL_TABLET | Freq: Two times a day (BID) | ORAL | 0 refills | Status: AC

## 2018-01-07 MED ORDER — POTASSIUM CHLORIDE CRYS ER 20 MEQ PO TBCR: 40.0000 meq | EXTENDED_RELEASE_TABLET | ORAL | 0 refills | Status: AC

## 2018-01-07 NOTE — Progress Notes (Signed)
Pharmacy Antibiotic Note  Loel RoRobert C Willhite is a 52 y.o. male admitted on 01/06/2018 with UTI.  Pharmacy has been consulted for ciprofloxacin dosing.  Plan: Ciprofloxacin 400 mg IV q12h  Monitor clinical course, renal function, cultures as available   Dosage remains stable at above dosage and need for further dosage adjustment appears unlikely at present.    Will sign off at this time.  Please reconsult if a change in clinical status warrants re-evaluation of dosage.       Height: 5\' 7"  (170.2 cm) Weight: 118 lb (53.5 kg) IBW/kg (Calculated) : 66.1  Temp (24hrs), Avg:98 F (36.7 C), Min:97.7 F (36.5 C), Max:98.2 F (36.8 C)  Recent Labs  Lab 01/02/18 0733 01/02/18 0812 01/06/18 1317  WBC 8.4  --  4.4  CREATININE  --  0.62 0.67    Estimated Creatinine Clearance: 80.8 mL/min (by C-G formula based on SCr of 0.67 mg/dL).    No Known Allergies  Antimicrobials this admission: 3/9 Ciprofloxacin >>     Thank you for allowing pharmacy to be a part of this patient's care.   Adalberto ColeNikola Adriano Bischof, PharmD, BCPS Pager [phone] 01/07/2018 12:15 AM

## 2018-01-07 NOTE — Discharge Instructions (Signed)
·   Activity:  No restrictions   Diet: You should advance your diet as instructed by your physician.  It will be normal to have some bloating, nausea, and abdominal discomfort intermittently.   Prescriptions:  You will be provided a prescription for pain medication to take as needed.  If your pain is not severe enough to require the prescription pain medication, you may take extra strength Tylenol instead which will have less side effects.  You should also take a prescribed stool softener to avoid straining with bowel movements as the prescription pain medication may constipate you.    What to call us about: You should call the office [phone]) if you develop fever > 101 or develop persistent vomiting. Activity:  You are encouraged to ambulate frequently (about every hour during waking hours) to help prevent blood clots from forming in your legs or lungs.  However, you should not engage in any heavy lifting (> 10-15 lbs), strenuous activity, or straining.

## 2018-01-07 NOTE — Consult Note (Signed)
Urology Inpatient Progress Report  abd pain / constipation    Intv/Subj: No acute events overnight. Patient is without complaint.  The patient has no pain today.  He has been voiding normally.  He had 2 bowel movements overnight.  He has very little complaints.  He has been afebrile with stable vital signs.  Principal Problem:   Ureteral stone with hydronephrosis Active Problems:   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HCC)   PAD (peripheral artery disease) (HCC)   Abdominal pain   Pressure injury of skin   Hypernatremia   Bilateral ureteral calculi  Current Facility-Administered Medications  Medication Dose Route Frequency Provider Last Rate Last Dose  . acetaminophen (TYLENOL) tablet 650 mg  650 mg Oral Q6H PRN Eduard ClosKakrakandy, Arshad N, MD       Or  . acetaminophen (TYLENOL) suppository 650 mg  650 mg Rectal Q6H PRN Eduard ClosKakrakandy, Arshad N, MD      . ciprofloxacin (CIPRO) IVPB 400 mg  400 mg Intravenous Q12H Adalberto ColeGlogovac, Nikola, RPH   Stopped at 01/07/18 0109  . dextrose 5 %-0.45 % sodium chloride infusion   Intravenous Continuous Eduard ClosKakrakandy, Arshad N, MD 75 mL/hr at 01/07/18 0008    . lactated ringers bolus 1,000 mL  1,000 mL Intravenous Once Albertine GratesXu, Fang, MD      . mirtazapine (REMERON) tablet 15 mg  15 mg Oral QHS Eduard ClosKakrakandy, Arshad N, MD   15 mg at 01/07/18 0009  . ondansetron (ZOFRAN) tablet 4 mg  4 mg Oral Q6H PRN Eduard ClosKakrakandy, Arshad N, MD       Or  . ondansetron [facility](ZOFRAN) injection 4 mg  4 mg Intravenous Q6H PRN Eduard ClosKakrakandy, Arshad N, MD         Objective: Vital: Vitals:   01/06/18 2217 01/06/18 2233 01/07/18 0500 01/07/18 0532  BP: 121/81   99/62  Pulse: (!) 55   (!) 53  Resp: 16   18  Temp: 98.2 F (36.8 C)   97.9 F (36.6 C)  TempSrc: Oral   Oral  SpO2: 100%   100%  Weight:   49.1 kg (108 lb 4.8 oz)   Height:  5\' 7"  (1.702 m)     I/Os: I/O last 3 completed shifts: In: 1565 [I.V.:365; IV Piggyback:1200] Out: 400 [Urine:400]  Physical Exam:  General: Patient is in no apparent  distress Lungs: Normal respiratory effort, chest expands symmetrically. GI:  The abdomen is soft and nontender without mass. Ext: lower extremities symmetric  Lab Results: Recent Labs    01/06/18 1317 01/07/18 0522  WBC 4.4 4.8  HGB 13.2 11.5*  HCT 41.1 35.4*   Recent Labs    01/06/18 1317 01/07/18 0522  NA 147* 144  K 2.8* 3.1*  CL 109 111  CO2 28 28  GLUCOSE 115* 89  BUN 13 7  CREATININE 0.67 0.44*  CALCIUM 10.0 8.7*   No results for input(s): LABPT, INR in the last 72 hours. No results for input(s): LABURIN in the last 72 hours. Results for orders placed or performed during the hospital encounter of 01/06/18  MRSA PCR Screening     Status: None   Collection Time: 01/06/18 10:40 PM  Result Value Ref Range Status   MRSA by PCR NEGATIVE NEGATIVE Final    Comment:        The GeneXpert MRSA Assay (FDA approved for NASAL specimens only), is one component of a comprehensive MRSA colonization surveillance program. It is not intended to diagnose MRSA infection nor to guide or monitor treatment for MRSA infections.  Performed at [facility], [address] [place], [place] [address]     Studies/Results: Ct Abdomen Pelvis W Contrast  Result Date: 01/06/2018 CLINICAL DATA:  Per EMS- Patient is a resident of [facility]. Patient c/o lower abdominal pain, constipation, and no urine output since yesterday. EXAM: CT ABDOMEN AND PELVIS WITH CONTRAST TECHNIQUE: Multidetector CT imaging of the abdomen and pelvis was performed using the standard protocol following bolus administration of intravenous contrast. CONTRAST:  ISOVUE-300 IOPAMIDOL (ISOVUE-300) INJECTION 61%, 30mL ISOVUE-300 IOPAMIDOL (ISOVUE-300) INJECTION 61% COMPARISON:  01/27/2017 FINDINGS: Lower chest: There are linear and discoid type opacities at the lung bases consistent with atelectasis/scarring. No acute findings. Hepatobiliary: No focal liver abnormality is seen. Status post  cholecystectomy. No biliary dilatation. Pancreas: Unremarkable. No pancreatic ductal dilatation or surrounding inflammatory changes. Spleen: Normal in size without focal abnormality. Adrenals/Urinary Tract: No adrenal masses. Moderate, right greater than left, hydronephrosis. On the right, this is due a 5 mm stone in the mid right ureter. On the left, this is due to 2 adjacent 2 mm stones at the ureterovesicular junction. 12 mm cyst arises from the upper pole of the left kidney. No other renal masses. No intrarenal stones. There is symmetric renal enhancement and excretion. No bladder mass or wall thickening. Stomach/Bowel: Stomach is within normal limits. Appendix appears normal. No evidence of bowel wall thickening, distention, or inflammatory changes. Vascular/Lymphatic: There is significant atherosclerotic disease. Plaque is noted along the aorta. There is plaque and thrombus occluding the left common iliac artery. The right external iliac artery is occluded with thrombus. The left external iliac artery is reconstituted. There is a femoral to femoral arterial vascular graft as well as a right axillary to femoral graft, which appear patent. No pathologically enlarged lymph nodes. Reproductive: Unremarkable. Other: No abdominal wall hernia.  No ascites. Musculoskeletal: No acute or significant osseous findings. IMPRESSION: 1. Bilateral ureteral stones, a 5 mm stone in the right mid ureter in 2 adjacent 2 mm stones in the distal left ureter at the ureterovesicular junction. These cause moderate, right greater than left, bilateral hydronephrosis. 2. No other acute abnormalities within the abdomen or pelvis. Specifically, no bowel obstruction or inflammation. No significant increase in colonic stool. Electronically Signed   By: Amie Portland M.D.   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  He is currently asymptomatic.  I  suspect that he likely passed a stone on the left.  His renal function is improved today.  He has no pain.  He has no evidence of infection.   Plan: I have recommended that the patient be discharged home this morning with follow-up in 2 weeks with me in clinic.  At that time, I will likely repeat a CT scan to ensure that the stones have progressed and/or passed.   Berniece Salines, MD Urology 01/07/2018, 11:02 AM

## 2018-01-07 NOTE — Progress Notes (Signed)
Called [facility] to give report. No answer. Will try again later

## 2018-01-07 NOTE — Progress Notes (Addendum)
LCSW following for facility placement.  Patient from [facility].  LCSW confirmed return with facility.   LCSW faxed dc documents to facility.  Patient will transport by EMS.   LCSW notified family of transfer.  RN report number: [id number]- [phone]

## 2018-01-07 NOTE — Progress Notes (Signed)
CSW updated on pt planned DC 01/07/18

## 2018-01-07 NOTE — Discharge Summary (Addendum)
Discharge Summary  Malik [name] ZOX:[id number] DOB: 12-May-1966  PCP: Palma HolterGunadasa, Kanishka G, MD  Admit date: 01/06/2018 Discharge date: 01/07/2018  Time spent: <3130mins  Recommendations for Outpatient Follow-up:  1. F/u with PMD within a week  for hospital discharge follow up, repeat cbc/bmp at follow up. pmd to follow up on final urine culture result. 2. F/u with urology for Bilateral ureteral stones 3. return to ALF  Discharge Diagnoses:  Active Hospital Problems   Diagnosis Date Noted  . Ureteral stone with hydronephrosis 01/06/2018  . Abdominal pain 01/06/2018  . Pressure injury of skin 01/06/2018  . Hypernatremia 01/06/2018  . Bilateral ureteral calculi   . PAD (peripheral artery disease) (HCC) 06/07/2016  . 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    Discharge Condition: stable  Diet recommendation: regular diet  Filed Weights   01/06/18 1245 01/07/18 0500  Weight: 53.5 kg (118 lb) 49.1 kg (108 lb 4.8 oz)    History of present illness: (per admitting MD Dr Toniann Failkakrakandy) Chief Complaint: Abdominal pain.  HPI: Malik [name] is a 52 y.o. male with history of advanced muscular dystrophy with lower extremity weakness, peripheral arterial disease status post right sided axillofemoral bypass, hyperlipidemia, GERD presents to the ER with complaint of abdominal pain patient has been having abdominal pain for last 2 days with some nausea.  Denies any vomiting.  Has some constipation.  Patient also briefly had right flank pain.  No fever or chills.  ED Course: In the ER patient had a CAT scan done which shows bilateral ureteral stone.  The right one was measuring around 5 mm in the left one was around 2 mm.  Causing bilateral hydronephrosis.  On-call urologist Dr. Marlou PorchHerrick was consulted and at this time neurologist has recommended admission for observation and feels that the left ureteral stone would likely be  passed.  No signs of infection or empiric antibiotics with Cipro has been started.    Hospital Course:  Principal Problem:   Ureteral stone with hydronephrosis Active Problems:   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HCC)   PAD (peripheral artery disease) (HCC)   Abdominal pain   Pressure injury of skin   Hypernatremia   Bilateral ureteral calculi   Bilateral ureteral stone with hydronephrosis -Urine culture in process, he does not have fever, no leukocytosis, renal function wnl, ab pain has resolved. He is treated with cipro in the hospital, he is cleared to discharge from the hospital by urology.  He is discharged on cipro and prn norco with close urology follow up.  Urology consulted Per urology Dr Marlou PorchHerrick "Imaging: I have independently reviewed the patient's CT scan which demonstrates small stone at the left UVJ, nearly in the patient's bladder, with mild hydroureteronephrosis.  There is some thickening of the urothelium on the side which is concerning.  However, the patient does not have any evidence of infection clinically.  The right stone is in the mid ureter with proximal hydroureteronephrosis.  Imp: The patient has bilateral obstructing stones.  The stone on the left is small and nearly into the patient's bladder, suspect he will pass this in the short-term.  He does have some thickening of that upper urinary tract which is concerning for infection, although his urine analysis today, his white count, and his normal vital signs would suggest otherwise.  The stone on the right is mid ureter and is not likely to pass in the near future.  The patient is  making urine, his renal function is normal.  His pain is well controlled.  Recommendations: I recommended admission to the hospital service given his associated comorbidities for observation.  Given the patient's significant weakness he is not a good candidate for general anesthesia.  Further, he is otherwise asymptomatic with no evidence  of infection.  I suspect he will pass the stone on the left which will prevent him from developing renal failure.  He can then be discharged home with close follow-up in the urology clinic to ensure that he is ultimately able to pass the stone on the right."  Hypokalemia: Replace k  H/o PVD s/p axillofemoral bilateral bypass   H/o muscular dystrophy, immobile with baseline mild dysarthria, he denies dysphagia.   Procedures:  none  Consultations:  urology  Case discussed with urology Dr Marlou Porch over the phone Family (aunt) updated at bedside Code status: DNR  Discharge Exam: BP 99/62 (BP Location: Left Arm)   Pulse (!) 53   Temp 97.9 F (36.6 C) (Oral)   Resp 18   Ht 5\' 7"  (1.702 m)   Wt 49.1 kg (108 lb 4.8 oz)   SpO2 100%   BMI 16.96 kg/m   General: frail, chronically ill, NAD Cardiovascular: RRR Respiratory: CTABL  Discharge Instructions You were cared for by a hospitalist during your hospital stay. If you have any questions about your discharge medications or the care you received while you were in the hospital after you are discharged, you can call the unit and asked to speak with the hospitalist on call if the hospitalist that took care of you is not available. Once you are discharged, your primary care physician will handle any further medical issues. Please note that NO REFILLS for any discharge medications will be authorized once you are discharged, as it is imperative that you return to your primary care physician (or establish a relationship with a primary care physician if you do not have one) for your aftercare needs so that they can reassess your need for medications and monitor your lab values.  Discharge Instructions    Diet - low sodium heart healthy   Complete by:  As directed    Increase activity slowly   Complete by:  As directed      Allergies as of 01/07/2018   No Known Allergies     Medication List    TAKE these medications   atorvastatin 40  MG tablet Commonly known as:  LIPITOR Take 40 mg by mouth every evening.   ciprofloxacin 500 MG tablet Commonly known as:  CIPRO Take 1 tablet (500 mg total) by mouth 2 (two) times daily for 6 days.   fenofibrate 145 MG tablet Commonly known as:  TRICOR Take 145 mg by mouth daily.   magnesium oxide 400 MG tablet Commonly known as:  MAG-OX Take 1 tablet (400 mg total) by mouth daily.   methocarbamol 500 MG tablet Commonly known as:  ROBAXIN Take 1 tablet (500 mg total) by mouth 2 (two) times daily as needed for muscle spasms.   mirtazapine 15 MG tablet Commonly known as:  REMERON Take 15 mg by mouth at bedtime.   pantoprazole 40 MG tablet Commonly known as:  PROTONIX Take 40 mg by mouth daily.   potassium chloride SA 20 MEQ tablet Commonly known as:  K-DUR,KLOR-CON Take 2 tablets (40 mEq total) by mouth every Monday, Wednesday, and Friday. Start taking on:  01/09/2018      No Known Allergies Follow-up Information  [name], MD Follow up in 1 week(s).   Specialty:  Family Medicine Why:  hospital discharge follow up, repeat cbc/bmp at follow up. Contact information: [address] [place] [address] [phone]        Crist Fat, MD Follow up.   Specialty:  Urology Why:  Bilateral ureteral stones Contact information: [address] [place] [address] [phone]            The results of significant diagnostics from this hospitalization (including imaging, microbiology, ancillary and laboratory) are listed below for reference.    Significant Diagnostic Studies: Dg Thoracic Spine 2 View  Result Date: 01/02/2018 CLINICAL DATA:  Generalized pain.  Poor historian. EXAM: THORACIC SPINE 2 VIEWS COMPARISON:  Chest x-ray dated 08/29/2017 FINDINGS: There is no evidence of thoracic spine fracture. Alignment is normal. No acute or suspicious osseous lesion. Mild degenerative changes again noted within the mid and lower thoracic spine.  Visualized paravertebral soft tissues are unremarkable. IMPRESSION: 1. No acute findings. 2. Mild degenerative change. Electronically Signed   By: Bary Richard M.D.   On: 01/02/2018 08:22   Ct Abdomen Pelvis W Contrast  Result Date: 01/06/2018 CLINICAL DATA:  Per EMS- Patient is a resident of [facility]. Patient c/o lower abdominal pain, constipation, and no urine output since yesterday. EXAM: CT ABDOMEN AND PELVIS WITH CONTRAST TECHNIQUE: Multidetector CT imaging of the abdomen and pelvis was performed using the standard protocol following bolus administration of intravenous contrast. CONTRAST:  ISOVUE-300 IOPAMIDOL (ISOVUE-300) INJECTION 61%, 30mL ISOVUE-300 IOPAMIDOL (ISOVUE-300) INJECTION 61% COMPARISON:  01/27/2017 FINDINGS: Lower chest: There are linear and discoid type opacities at the lung bases consistent with atelectasis/scarring. No acute findings. Hepatobiliary: No focal liver abnormality is seen. Status post cholecystectomy. No biliary dilatation. Pancreas: Unremarkable. No pancreatic ductal dilatation or surrounding inflammatory changes. Spleen: Normal in size without focal abnormality. Adrenals/Urinary Tract: No adrenal masses. Moderate, right greater than left, hydronephrosis. On the right, this is due a 5 mm stone in the mid right ureter. On the left, this is due to 2 adjacent 2 mm stones at the ureterovesicular junction. 12 mm cyst arises from the upper pole of the left kidney. No other renal masses. No intrarenal stones. There is symmetric renal enhancement and excretion. No bladder mass or wall thickening. Stomach/Bowel: Stomach is within normal limits. Appendix appears normal. No evidence of bowel wall thickening, distention, or inflammatory changes. Vascular/Lymphatic: There is significant atherosclerotic disease. Plaque is noted along the aorta. There is plaque and thrombus occluding the left common iliac artery. The right external iliac artery is occluded with  thrombus. The left external iliac artery is reconstituted. There is a femoral to femoral arterial vascular graft as well as a right axillary to femoral graft, which appear patent. No pathologically enlarged lymph nodes. Reproductive: Unremarkable. Other: No abdominal wall hernia.  No ascites. Musculoskeletal: No acute or significant osseous findings. IMPRESSION: 1. Bilateral ureteral stones, a 5 mm stone in the right mid ureter in 2 adjacent 2 mm stones in the distal left ureter at the ureterovesicular junction. These cause moderate, right greater than left, bilateral hydronephrosis. 2. No other acute abnormalities within the abdomen or pelvis. Specifically, no bowel obstruction or inflammation. No significant increase in colonic stool. Electronically Signed   By: Amie Portland M.D.   On: 01/06/2018 18:37    Microbiology: Recent Results (from the past 240 hour(s))  MRSA PCR Screening     Status: None   Collection Time: 01/06/18  10:40 PM  Result Value Ref Range Status   MRSA by PCR NEGATIVE NEGATIVE Final    Comment:        The GeneXpert MRSA Assay (FDA approved for NASAL specimens only), is one component of a comprehensive MRSA colonization surveillance program. It is not intended to diagnose MRSA infection nor to guide or monitor treatment for MRSA infections. Performed at [facility], [address], [place], [place] [address]      Labs: Basic Metabolic Panel: Recent Labs  Lab 01/02/18 0812 01/06/18 1317 01/07/18 0522  NA 148* 147* 144  K 3.5 2.8* 3.1*  CL 111 109 111  CO2 29 28 28   GLUCOSE 111* 115* 89  BUN 15 13 7   CREATININE 0.62 0.67 0.44*  CALCIUM 9.2 10.0 8.7*   Liver Function Tests: Recent Labs  Lab 01/02/18 0812 01/06/18 1317  AST 25 32  ALT 15* 17  ALKPHOS 77 93  BILITOT 1.0 1.2  PROT 5.8* 6.2*  ALBUMIN 3.4* 3.5   Recent Labs  Lab 01/02/18 0812 01/06/18 1317  LIPASE 25 34   No results for input(s): AMMONIA in the last 168  hours. CBC: Recent Labs  Lab 01/02/18 0733 01/06/18 1317 01/07/18 0522  WBC 8.4 4.4 4.8  NEUTROABS 7.2  --   --   HGB 14.8 13.2 11.5*  HCT 45.3 41.1 35.4*  MCV 99.8 100.7* 99.4  PLT 124* 141* 125*   Cardiac Enzymes: No results for input(s): CKTOTAL, CKMB, CKMBINDEX, TROPONINI in the last 168 hours. BNP: BNP (last 3 results) No results for input(s): BNP in the last 8760 hours.  ProBNP (last 3 results) No results for input(s): PROBNP in the last 8760 hours.  CBG: Recent Labs  Lab 01/06/18 2346 01/07/18 0747  GLUCAP 76 87       Signed:  [name] MD, PhD  [facility] 01/07/2018, 10:51 AM

## 2018-01-07 NOTE — Progress Notes (Signed)
Pt left with ptar. Pt had all belongings and copy of AVS, Called [facility] x3 with no answer.

## 2018-01-07 NOTE — Plan of Care (Signed)
Monitor I&O. Pt with condom cath. Will cont to mon

## 2018-01-08 LAB — URINE CULTURE

## 2018-01-10 ENCOUNTER — Telehealth: Payer: Self-pay | Admitting: Family

## 2018-01-10 NOTE — Telephone Encounter (Signed)
Tried to call aunt to relay messages about appt but no answering machine. Called [facility] and spoke with nurse re appt on 01/23/18  LS

## 2018-01-10 NOTE — Telephone Encounter (Signed)
-----   Message from Kateri Mceborah S Thomas sent at 01/09/2018 10:11 AM EDT ----- Regarding: LABS DENIED ABI APPROVED - ONLY APPLIES TO 93931  MEDICAID DENIED BECAUSE OF RECENT STUDIES PERFORMED AND THEY DO NOT SHOW WHY THESE RESULTS ARE NOT SUFFICIENT.

## 2018-01-23 ENCOUNTER — Encounter: Payer: Self-pay | Admitting: Family

## 2018-01-23 ENCOUNTER — Ambulatory Visit (INDEPENDENT_AMBULATORY_CARE_PROVIDER_SITE_OTHER): Payer: Medicaid Other | Admitting: Family

## 2018-01-23 ENCOUNTER — Encounter (HOSPITAL_COMMUNITY): Payer: Medicaid Other

## 2018-01-23 ENCOUNTER — Ambulatory Visit (HOSPITAL_COMMUNITY)
Admission: RE | Admit: 2018-01-23 | Discharge: 2018-01-23 | Disposition: A | Discharge status: Home / Self Care | Payer: Medicaid Other | Source: Ambulatory Visit | Attending: Surgery | Admitting: Surgery

## 2018-01-23 VITALS — BP 104/71 | HR 59 | Temp 96.7°F | Resp 18 | Wt 118.0 lb

## 2018-01-23 DIAGNOSIS — I779 Disorder of arteries and arterioles, unspecified: Secondary | ICD-10-CM

## 2018-01-23 DIAGNOSIS — I739 Peripheral vascular disease, unspecified: Secondary | ICD-10-CM | POA: Insufficient documentation

## 2018-01-23 DIAGNOSIS — F17200 Nicotine dependence, unspecified, uncomplicated: Secondary | ICD-10-CM | POA: Diagnosis not present

## 2018-01-23 DIAGNOSIS — Z87891 Personal history of nicotine dependence: Secondary | ICD-10-CM | POA: Diagnosis not present

## 2018-01-23 DIAGNOSIS — R9439 Abnormal result of other cardiovascular function study: Secondary | ICD-10-CM | POA: Diagnosis not present

## 2018-01-23 NOTE — Progress Notes (Signed)
[facility]   CC: Follow up peripheral artery occlusive disease  History of Present Illness Malik [name] is a 52 y.o. male who is s/pRight axillary bifemoral bypasson 03-07-17 by Dr. Darrick Penna for rest pain in left foot. He states that he really has no rest pain in his feetor legs.   Dr. Darrick Penna last evaluated pt on 03-31-17. At that timeright flank bypass graft was palpable,well-healed axillary and groin incisions,no erythema over the graft. Doing well status post axillary bifemoral bypass graft. The patient was shown how to check the pulse in his graft thatday and he will call us if he does not find this pulse. Otherwise he was to follow-up with [place] with a graft duplex scan ABIs in 3 months.   He is a resident of [facility].  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  Pt Diabetic:No Pt smoker:formersmoker, 1 pack per month, started in 1985, quit 08-01-17  Pt meds include: Statin :Yes Betablocker:No ASA:yes Other anticoagulants/antiplatelets:no    Past Medical History:  Diagnosis Date  . DVT (deep venous thrombosis) (HCC)    left brachial vein DVT 01/29/16  . Hiccups   . High blood pressure   . History of kidney stones   . Hypertension   . Myotonic muscular dystrophy [facility])     Social History Social History   Tobacco Use  . Smoking status: Former Smoker    Packs/day: 0.30    Types: Cigarettes    Start date: 11/02/1983    Last attempt to quit: 08/01/2017    Years since quitting: 0.4  . Smokeless tobacco: Never Used  . Tobacco comment: 4-5 cigs per day  Substance Use Topics  . Alcohol use: No  . Drug use: No    Types: Marijuana    Comment: Pt smokes MJ ocassinally.  Denies ever using IV drugs.     Family History Family History  Problem Relation Age of Onset  . COPD Mother   . Diabetes Mother   . Breast cancer Mother   . Cancer Father   . Cancer Sister    . Muscular dystrophy Sister     Past Surgical History:  Procedure Laterality Date  . AXILLARY-FEMORAL BYPASS GRAFT Right 03/07/2017   Procedure: RIGHT AXILLA-BIFEMORAL BYPASS GRAFT USING HEMASHIELD GOLD VASCULAR GRAFT;  Surgeon: [name], MD;  Location[facility] OR;  Service: Vascular;  Laterality: Right;  . CHOLECYSTECTOMY    . ENDARTERECTOMY FEMORAL Left 03/07/2017   Procedure: ENDARTERECTOMY LEFT FEMORAL ARTERY ;  Surgeon: [name], MD;  Location: [facility] OR;  Service: Vascular;  Laterality: Left;  . ESOPHAGOGASTRODUODENOSCOPY N/A 05/10/2016   Procedure: ESOPHAGOGASTRODUODENOSCOPY (EGD);  Surgeon: [name], MD;  Location: [facility];  Service: Endoscopy;  Laterality: N/A;  Please discuss sedation possibilities with Dr. [name]  . ESOPHAGOGASTRODUODENOSCOPY N/A 08/29/2017   Procedure: ESOPHAGOGASTRODUODENOSCOPY (EGD);  Surgeon: [name], MD;  Location: [facility];  Service: Endoscopy;  Laterality: N/A;  . MULTIPLE TOOTH EXTRACTIONS  2007   Pt had all teeth pulled, has not yet gotten dentures.   Marland Kitchen PATCH ANGIOPLASTY Left 03/07/2017   Procedure: PATCH ANGIOPLASTY LEFT FEMORAL ARTERY USING XENOSURE BIOLOGIC PATCH;  Surgeon: [name], MD;  Location: [facility] OR;  Service: Vascular;  Laterality: Left;    No Known Allergies  Current Outpatient Medications  Medication Sig Dispense Refill  . atorvastatin (LIPITOR) 40 MG tablet Take 40 mg by mouth every evening.    Marland Kitchen  fenofibrate (TRICOR) 145 MG tablet Take 145 mg by mouth daily.    Marland Kitchen. HYDROcodone-acetaminophen (NORCO) 5-325 MG tablet Take 1 tablet by mouth every 6 (six) hours as needed for moderate pain. 5 tablet 0  . magnesium oxide (MAG-OX) 400 MG tablet Take 1 tablet (400 mg total) by mouth daily. 30 tablet 0  . methocarbamol (ROBAXIN) 500 MG tablet Take 1 tablet (500 mg total) by mouth 2 (two) times daily as needed for muscle spasms. 12 tablet 0  . mirtazapine (REMERON) 15 MG tablet Take 15 mg by mouth at bedtime.    .  pantoprazole (PROTONIX) 40 MG tablet Take 40 mg by mouth daily.    . potassium chloride SA (K-DUR,KLOR-CON) 20 MEQ tablet Take 2 tablets (40 mEq total) by mouth every Monday, Wednesday, and Friday. 30 tablet 0   No current facility-administered medications for this visit.     ROS: See HPI for pertinent positives and negatives.   Physical Examination  Vitals:   01/23/18 1426 01/23/18 1428  BP: 111/80 104/71  Pulse: (!) 59   Resp: 18   Temp: (!) 96.7 F (35.9 C)   TempSrc: Oral   SpO2: 99%   Weight: 118 lb (53.5 kg)    Body mass index is 18.48 kg/m.  General: A&O x 3, wearing pajama pants and a t-shirt,thin male. Gait:seated in w/c Eyes: PERRLA. Pulmonary: Respirations are non labored, CTAB,fairair movement Cardiac:regularrhythm, no detected murmur.    Carotid Bruits Right Left   Negative Negative   Radial pulses are 1+ palpable bilaterally. Both hands are pale, general mild pallor. Adominal aortic pulse isnotpalpable . Right flank bypass graft pulse is palpable.  VASCULAR EXAM: Extremitieswithoutischemic changes, withoutGangrene; withoutopen wounds.  LE Pulses Right Left  FEMORAL faintly palpable, seated in w/c notpalpable   POPLITEAL notpalpable  notpalpable  POSTERIOR TIBIAL notpalpable  notpalpable   DORSALIS PEDIS ANTERIOR TIBIAL notpalpable  notpalpable    Abdomen: soft, NT, nopalpablemasses. Musculoskeletal:+muscle wasting and atrophy in arms and legs Neurologic: A&O X 3; Appropriate Affect ; SENSATION: normal; MOTOR FUNCTION: moving all extremities equally, motor strength 2/5 in right UE, 3/5 in left UE, and 4/5in legs. Speech is fluent/normal. CN 2-12 intact  Skin: no rashes, no cellulitis, no ulcers noted.  Neurologic: A&O X 3; appropriate affect, Sensation is normal; MOTOR FUNCTION:  moving all extremities equally, motor strength 5/5  throughout. Speech is fluent/normal. CN 2-12 intact. Psychiatric: Thought content is normal, mood appropriate for clinical situation.     ASSESSMENT: Malik [name] is a 52 y.o. male  who is s/pRight axillary bifemoral bypasson 03-07-17. Right flank bypass graft pulse is palpable.   He denies rest pain in his lower extremities. 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 He has muscle wasting in his arms and legs.  There are no signs of ischemia in his feet or legs, hands and feet are cool to touch.   His atherosclerotic risk factors include former smoker until October of 2018. I congratulated him on quitting and encouraged continued abstinence.   Fortunately he does not have DM.  He takes a daily statin and ASA.    DATA  ABI (Date: 01/23/2018):  R:   ABI: 0.79 (was 0.73 on 10-20-17),   PT: mono (was bi)  DP: absent (was absent)  Pero: mono  TBI:  0.64 (was 0.69)  L:   ABI: 0.65 (was 0.70),   PT: mono  DP: mono  TBI: 0.37 (was 0.57)  Slight improvement in right ABI.  Slight decline in left ABI and bilateral TBI. All monophasic waveforms; right DP remains absent.    Right axillary bifemoral bypassduplex (10/20/17): Patent right axillary-bifemoral bypass graft without evidence of stenosis, triphasic waveforms throughout.  No change compared to the exam on 07/21/17.     PLAN:  Based on the patient's vascular studies and examination, pt will return to clinic in30monthswith ABI's. I advised pt to notify us if he develops concerns re the circulation in his feet or legs.     I discussed in depth with the patient the nature of atherosclerosis, and emphasized the importance of maximal medical management including strict control of blood pressure, blood glucose, and lipid levels, obtaining regular exercise, and contineud cessation of smoking.  The patient is aware that without maximal medical management the underlying atherosclerotic disease  process will progress, limiting the benefit of any interventions.  The patient was given information about PAD including signs, symptoms, treatment, what symptoms should prompt the patient to seek immediate medical care, and risk reduction measures to take.  Charisse March, RN, MSN, FNP-C [facility] Phone: [phone]  Clinic MD: Myra Gianotti  01/23/18 3:03 PM

## 2018-01-23 NOTE — Patient Instructions (Signed)

## 2018-01-25 NOTE — Progress Notes (Signed)
   [facility] Phone: [phone]   Date of Visit: 01/26/2018   HPI:  Patient was hospitalized from 3/8 to 3/9. - presented with abdominal pain x 2 days with nausea and brief right flank pain - CT showed bilateral ureteral stone, 5mm and 2mm with bilateral hydronephrosis. Urology recommended admission for observation. No procedures were required. He was discharged on Ciprofloxacin x 6 days and PRN Norco. Urine culture showed "multiple species".  - reports he is doing well. No concerns today  - he passed one kidney stone (on the left). He saw his urologist 2 days ago, and has follow up in 2 weeks  -denies any pain, hematuria or difficulty with urination.   - medication management: reports he is not taking ASA. He is unsure why.   ROS: See HPI.  PMFSH: PMH: HTN, PAD s/p R axillofemoral bypass, HLD Myotonic Muscular Dystrophy Hx DVT Ureteral Stone with Hydronephrosis, Bilateral Ureteral Calculi Hx of Tobacco Use Vitamin D Deficiency GERD  PHYSICAL EXAM: BP 106/70   Pulse 62   Temp 97.8 F (36.6 C) (Oral)   Ht 5\' 7"  (1.702 m)   SpO2 98%   BMI 18.48 kg/m  GEN: NAD, sitting in wheelchair  CV: RRR, no murmurs, rubs, or gallops PULM: CTAB, normal effort SKIN: No rash or cyanosis; warm and well-perfused, no sacral pressure injury  PSYCH: Mood and affect euthymic, normal rate and volume of speech NEURO: Awake, alert,  normal speech, wheelchair bound.    ASSESSMENT/PLAN:  1. Hospital discharge follow-up 2. Kidney stones Doing well. Has close follow up with urology   3. Hypokalemia - Basic metabolic panel  4. Anemia, unspecified type Macrocytic anemia - CBC - Ferritin - Folate - Vitamin B12  5. Medication Management:  Per chart review, should be on ASA (no CI). Will call and inform.   Palma HolterKanishka G Anicka Stuckert, MD PGY 3  Family Medicine

## 2018-01-26 ENCOUNTER — Other Ambulatory Visit: Payer: Self-pay

## 2018-01-26 ENCOUNTER — Encounter: Payer: Self-pay | Admitting: Internal Medicine

## 2018-01-26 ENCOUNTER — Ambulatory Visit (INDEPENDENT_AMBULATORY_CARE_PROVIDER_SITE_OTHER): Payer: Medicaid Other | Admitting: Internal Medicine

## 2018-01-26 VITALS — BP 106/70 | HR 62 | Temp 97.8°F | Ht 67.0 in

## 2018-01-26 DIAGNOSIS — D649 Anemia, unspecified: Secondary | ICD-10-CM

## 2018-01-26 DIAGNOSIS — N200 Calculus of kidney: Secondary | ICD-10-CM

## 2018-01-26 DIAGNOSIS — E876 Hypokalemia: Secondary | ICD-10-CM | POA: Diagnosis not present

## 2018-01-26 DIAGNOSIS — Z09 Encounter for follow-up examination after completed treatment for conditions other than malignant neoplasm: Secondary | ICD-10-CM | POA: Diagnosis present

## 2018-01-26 DIAGNOSIS — Z79899 Other long term (current) drug therapy: Secondary | ICD-10-CM

## 2018-01-26 NOTE — Patient Instructions (Signed)
Thank you for coming  I am glad you are feeling better  We will get some blood work to check for anemia and electrolytes.

## 2018-01-27 ENCOUNTER — Other Ambulatory Visit: Payer: Self-pay

## 2018-01-27 ENCOUNTER — Telehealth: Payer: Self-pay | Admitting: Internal Medicine

## 2018-01-27 ENCOUNTER — Encounter: Payer: Self-pay | Admitting: Internal Medicine

## 2018-01-27 DIAGNOSIS — I779 Disorder of arteries and arterioles, unspecified: Secondary | ICD-10-CM

## 2018-01-27 LAB — BASIC METABOLIC PANEL
BUN/Creatinine Ratio: 28 — ABNORMAL HIGH (ref 9–20)
BUN: 18 mg/dL (ref 6–24)
CO2: 26 mmol/L (ref 20–29)
Calcium: 9.8 mg/dL (ref 8.7–10.2)
Chloride: 105 mmol/L (ref 96–106)
Creatinine, Ser: 0.64 mg/dL — ABNORMAL LOW (ref 0.76–1.27)
GFR calc Af Amer: 129 mL/min/{1.73_m2} (ref >59)
GFR calc non Af Amer: 112 mL/min/{1.73_m2} (ref >59)
Glucose: 77 mg/dL (ref 65–99)
Potassium: 4.9 mmol/L (ref 3.5–5.2)
Sodium: 146 mmol/L — ABNORMAL HIGH (ref 134–144)

## 2018-01-27 LAB — CBC
Hematocrit: 40.3 % (ref 37.5–51.0)
Hemoglobin: 13.2 g/dL (ref 13.0–17.7)
MCH: 32.1 pg (ref 26.6–33.0)
MCHC: 32.8 g/dL (ref 31.5–35.7)
MCV: 98 fL — ABNORMAL HIGH (ref 79–97)
Platelets: 148 10*3/uL — ABNORMAL LOW (ref 150–379)
RBC: 4.11 x10E6/uL — ABNORMAL LOW (ref 4.14–5.80)
RDW: 15.5 % — ABNORMAL HIGH (ref 12.3–15.4)
WBC: 4.8 10*3/uL (ref 3.4–10.8)

## 2018-01-27 LAB — VITAMIN B12: Vitamin B-12: 230 pg/mL — ABNORMAL LOW (ref 232–1245)

## 2018-01-27 LAB — FOLATE: Folate: 7.2 ng/mL (ref >3.0)

## 2018-01-27 LAB — FERRITIN: Ferritin: 167 ng/mL (ref 30–400)

## 2018-01-27 MED ORDER — ASPIRIN EC 81 MG PO TBEC: 81.0000 mg | DELAYED_RELEASE_TABLET | Freq: Every day | ORAL | 11 refills | Status: AC

## 2018-01-27 MED ORDER — ONE-A-DAY MENS PO TABS: 1.0000 | ORAL_TABLET | Freq: Every day | ORAL | 11 refills | Status: AC

## 2018-01-27 NOTE — Telephone Encounter (Signed)
Spoke with staff member at [facility]F.  - recommended starting daily MVM and ASA 81mg  daily.  - faxed rx for both to [phone]

## 2018-02-18 ENCOUNTER — Emergency Department (HOSPITAL_COMMUNITY): Payer: Medicaid Other

## 2018-02-18 ENCOUNTER — Encounter (HOSPITAL_COMMUNITY): Payer: Self-pay

## 2018-02-18 ENCOUNTER — Other Ambulatory Visit: Payer: Self-pay

## 2018-02-18 ENCOUNTER — Emergency Department (HOSPITAL_COMMUNITY)
Admission: EM | Admit: 2018-02-18 | Discharge: 2018-02-18 | Disposition: A | Discharge status: Skilled Nursing Facility | Payer: Medicaid Other | Attending: Emergency Medicine | Admitting: Emergency Medicine

## 2018-02-18 DIAGNOSIS — Z79899 Other long term (current) drug therapy: Secondary | ICD-10-CM | POA: Diagnosis not present

## 2018-02-18 DIAGNOSIS — I10 Essential (primary) hypertension: Secondary | ICD-10-CM | POA: Insufficient documentation

## 2018-02-18 DIAGNOSIS — N201 Calculus of ureter: Secondary | ICD-10-CM | POA: Diagnosis not present

## 2018-02-18 DIAGNOSIS — G7111 Myotonic muscular dystrophy: Secondary | ICD-10-CM | POA: Diagnosis not present

## 2018-02-18 DIAGNOSIS — Z87891 Personal history of nicotine dependence: Secondary | ICD-10-CM | POA: Diagnosis not present

## 2018-02-18 DIAGNOSIS — Z7982 Long term (current) use of aspirin: Secondary | ICD-10-CM | POA: Diagnosis not present

## 2018-02-18 DIAGNOSIS — R1031 Right lower quadrant pain: Secondary | ICD-10-CM | POA: Diagnosis present

## 2018-02-18 LAB — COMPREHENSIVE METABOLIC PANEL
ALT: 22 U/L (ref 17–63)
AST: 44 U/L — ABNORMAL HIGH (ref 15–41)
Albumin: 3.2 g/dL — ABNORMAL LOW (ref 3.5–5.0)
Alkaline Phosphatase: 73 U/L (ref 38–126)
Anion gap: 8 (ref 5–15)
BUN: 15 mg/dL (ref 6–20)
CO2: 27 mmol/L (ref 22–32)
Calcium: 9.3 mg/dL (ref 8.9–10.3)
Chloride: 109 mmol/L (ref 101–111)
Creatinine, Ser: 0.74 mg/dL (ref 0.61–1.24)
GFR calc Af Amer: >60 mL/min (ref >60)
GFR calc non Af Amer: >60 mL/min (ref >60)
Glucose, Bld: 101 mg/dL — ABNORMAL HIGH (ref 65–99)
Potassium: 3.6 mmol/L (ref 3.5–5.1)
Sodium: 144 mmol/L (ref 135–145)
Total Bilirubin: 0.7 mg/dL (ref 0.3–1.2)
Total Protein: 6.9 g/dL (ref 6.5–8.1)

## 2018-02-18 LAB — URINALYSIS, ROUTINE W REFLEX MICROSCOPIC
Bacteria, UA: NONE SEEN
Bilirubin Urine: NEGATIVE
Glucose, UA: NEGATIVE mg/dL
Ketones, ur: NEGATIVE mg/dL
Leukocytes, UA: NEGATIVE
Nitrite: NEGATIVE
Protein, ur: 100 mg/dL — AB
Specific Gravity, Urine: 1.021 (ref 1.005–1.030)
Squamous Epithelial / LPF: NONE SEEN
pH: 6 (ref 5.0–8.0)

## 2018-02-18 LAB — CBC WITH DIFFERENTIAL/PLATELET
Basophils Absolute: 0 10*3/uL (ref 0.0–0.1)
Basophils Relative: 0 %
Eosinophils Absolute: 0 10*3/uL (ref 0.0–0.7)
Eosinophils Relative: 0 %
HCT: 37.7 % — ABNORMAL LOW (ref 39.0–52.0)
Hemoglobin: 12.2 g/dL — ABNORMAL LOW (ref 13.0–17.0)
Lymphocytes Relative: 17 %
Lymphs Abs: 1.4 10*3/uL (ref 0.7–4.0)
MCH: 31.1 pg (ref 26.0–34.0)
MCHC: 32.4 g/dL (ref 30.0–36.0)
MCV: 96.2 fL (ref 78.0–100.0)
Monocytes Absolute: 0.8 10*3/uL (ref 0.1–1.0)
Monocytes Relative: 9 %
Neutro Abs: 6.3 10*3/uL (ref 1.7–7.7)
Neutrophils Relative %: 74 %
Platelets: 331 10*3/uL (ref 150–400)
RBC: 3.92 MIL/uL — ABNORMAL LOW (ref 4.22–5.81)
RDW: 14.7 % (ref 11.5–15.5)
WBC: 8.5 10*3/uL (ref 4.0–10.5)

## 2018-02-18 LAB — LIPASE, BLOOD: Lipase: 50 U/L (ref 11–51)

## 2018-02-18 MED ORDER — HYDROCODONE-ACETAMINOPHEN 5-325 MG PO TABS: 1.0000 | ORAL_TABLET | ORAL | 0 refills | Status: DC | PRN

## 2018-02-18 MED ORDER — FENTANYL CITRATE (PF) 100 MCG/2ML IJ SOLN
50.0000 ug | Freq: Once | INTRAMUSCULAR | Status: AC
  Administered 2018-02-18: 50 ug via INTRAVENOUS
  Filled 2018-02-18: qty 2

## 2018-02-18 MED ORDER — TAMSULOSIN HCL 0.4 MG PO CAPS: 0.4000 mg | ORAL_CAPSULE | Freq: Every day | ORAL | 0 refills | Status: AC

## 2018-02-18 MED ORDER — ONDANSETRON HCL 4 MG/2ML IJ SOLN
4.0000 mg | Freq: Once | INTRAMUSCULAR | Status: AC
  Administered 2018-02-18: 4 mg via INTRAVENOUS
  Filled 2018-02-18: qty 2

## 2018-02-18 NOTE — ED Provider Notes (Signed)
[facility]-EMERGENCY DEPT Provider Note   CSN: [id number] Arrival date & time: 02/18/18  0126     History   Chief Complaint Chief Complaint  Patient presents with  . Abdominal Pain    HPI Malik [name] is a 52 y.o. male.  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  Reports some nausea but denies any emesis.  Patient also reports some diarrhea.  Denies any associated fevers or chills.  He did not take anything for his pain prior to arrival.  The patient describes the pain as sharp in nature.  Pain is constant.  Made worse with palpation and movement.  Nothing makes the pain better.  Patient states that he has had increased urinary urgency and frequency with hematuria.  Denies any associated testicular pain or swelling.  Patient reports that he was admitted to the hospital on 3/8 for bilateral ureteral stones.  Patient states he is followed by urology but has not been able to follow back up in their clinic due to poor transport resources from facility.  Patient reports that he is not having pain medication at facility for his kidney stone pain.  Patient denies any associated fevers or chills.  Reports prior cholecystectomy but no other abdominal surgeries.  Pt denies any fever, chill, ha, vision changes, lightheadedness, dizziness, congestion, neck pain, cp, sob, cough, melena, hematochezia, lower extremity paresthesias.  Past Medical History:  Diagnosis Date  . DVT (deep venous thrombosis) (HCC)    left brachial vein DVT 01/29/16  . Hiccups   . High blood pressure   . History of kidney stones   . Hypertension   . 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  Abdominal pain 01/06/2018  . Pressure injury of skin 01/06/2018  . Ureteral stone with hydronephrosis 01/06/2018  . Hypernatremia 01/06/2018  . Bilateral ureteral calculi   . PAD (peripheral artery disease) (HCC) 06/07/2016  . Leg pain, bilateral 06/02/2016  . Esophageal obstruction due to food impaction   . DVT of upper extremity (deep vein thrombosis) (HCC) 01/29/2016  . Residence in long term care facility 03/04/2014  . EKG abnormality 11/09/2013  . Health care maintenance 06/22/2013  . Epidermal cyst 06/22/2013  . Skin lesion of hand 06/22/2013  . 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 Hypertension, benign 01/20/2012  . Unspecified vitamin D deficiency 08/05/2011  . Vitamin B 12 deficiency 07/14/2011  . 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 Muscle weakness (generalized) 04/16/2011  . Weight finding 04/16/2011  . History of tobacco use 04/16/2011    Past Surgical History:  Procedure Laterality Date  . AXILLARY-FEMORAL BYPASS GRAFT Right 03/07/2017   Procedure: RIGHT AXILLA-BIFEMORAL BYPASS GRAFT USING HEMASHIELD GOLD VASCULAR GRAFT;  Surgeon: [name], MD;  Location: [facility] OR;  Service: Vascular;  Laterality: Right;  . CHOLECYSTECTOMY    . ENDARTERECTOMY FEMORAL Left 03/07/2017   Procedure: ENDARTERECTOMY LEFT FEMORAL ARTERY ;  Surgeon: [name], MD;  Location: [facility] OR;  Service: Vascular;  Laterality: Left;  . ESOPHAGOGASTRODUODENOSCOPY N/A 05/10/2016   Procedure: ESOPHAGOGASTRODUODENOSCOPY (EGD);  Surgeon: [name], MD;  Location: [facility];  Service: Endoscopy;  Laterality: N/A;  Please discuss sedation possibilities with Dr. [name]  . ESOPHAGOGASTRODUODENOSCOPY N/A 08/29/2017  Procedure: ESOPHAGOGASTRODUODENOSCOPY (EGD);  Surgeon: [name], MD;  Location: [facility];  Service: Endoscopy;  Laterality: N/A;  . MULTIPLE TOOTH EXTRACTIONS  2007   Pt had all teeth pulled, has not yet gotten dentures.   Marland Kitchen PATCH ANGIOPLASTY Left 03/07/2017   Procedure: PATCH  ANGIOPLASTY LEFT FEMORAL ARTERY USING XENOSURE BIOLOGIC PATCH;  Surgeon: [name], MD;  Location: [facility] OR;  Service: Vascular;  Laterality: Left;        Home Medications    Prior to Admission medications   Medication Sig Start Date End Date Taking? Authorizing Provider  aspirin EC 81 MG tablet Take 1 tablet (81 mg total) by mouth daily. 01/27/18  Yes [name], MD  atorvastatin (LIPITOR) 40 MG tablet Take 40 mg by mouth every evening.   Yes [provider]  carbamide peroxide (DEBROX) 6.5 % OTIC solution Place 5 drops into both ears at bedtime.   Yes [provider]  fenofibrate (TRICOR) 145 MG tablet Take 145 mg by mouth daily.   Yes [provider]  guaifenesin (MUCUS RELIEF) 400 MG TABS tablet Take 800 mg by mouth 2 (two) times daily.   Yes [provider]  HYDROcodone-acetaminophen (NORCO) 5-325 MG tablet Take 1 tablet by mouth every 6 (six) hours as needed for moderate pain. 01/07/18  Yes [name], MD  magnesium oxide (MAG-OX) 400 MG tablet Take 1 tablet (400 mg total) by mouth daily. 01/07/18  Yes [name], MD  methocarbamol (ROBAXIN) 500 MG tablet Take 1 tablet (500 mg total) by mouth 2 (two) times daily as needed for muscle spasms. 01/02/18  Yes [name], MD  mirtazapine (REMERON) 15 MG tablet Take 15 mg by mouth at bedtime.   Yes [provider]  Multiple Vitamins-Minerals (MULTIVITAMIN MEN 50+) TABS Take 1 tablet by mouth daily.   Yes [provider]  multivitamin (ONE-A-DAY MEN'S) TABS tablet Take 1 tablet by mouth daily. 01/27/18  Yes [name], MD  potassium chloride SA (K-DUR,KLOR-CON) 20 MEQ tablet Take 2 tablets (40 mEq total) by mouth every Monday, Wednesday, and Friday. 01/09/18  Yes [name], MD  ranitidine (ZANTAC) 150 MG tablet Take 150 mg by mouth at bedtime.   Yes [provider]  vitamin B-12 (CYANOCOBALAMIN) 1000 MCG tablet Take 1,000 mcg by mouth daily.   Yes [provider]    Family History Family History  Problem Relation Age of Onset  . COPD Mother   . Diabetes Mother   . Breast cancer Mother   . Cancer Father   . Cancer Sister   . Muscular dystrophy Sister     Social History Social History   Tobacco Use  . Smoking status: Former Smoker    Packs/day: 0.30    Types: Cigarettes    Start date: 11/02/1983    Last attempt to quit: 08/01/2017    Years since quitting: 0.5  . Smokeless tobacco: Never Used  . Tobacco comment: 4-5 cigs per day  Substance Use Topics  . Alcohol use: No  . Drug use: No    Types: Marijuana    Comment: Pt smokes MJ ocassinally.  Denies ever using IV drugs.      Allergies   Patient has no known allergies.   Review of Systems Review of Systems  All other systems reviewed and are negative.    Physical Exam Updated Vital Signs BP 112/78   Pulse (!) 59   Temp 97.8 F (36.6 C) (Oral)   Resp 18  Ht 5\' 7"  (1.702 m)   Wt 53.5 kg (118 lb)   SpO2 98%   BMI 18.48 kg/m   Physical Exam  Constitutional: He is oriented to person, place, and time. He appears well-developed and well-nourished.  Non-toxic appearance. No distress.  Chronically thin appearing male.  HENT:  Head: Normocephalic and atraumatic.  Mouth/Throat: Oropharynx is clear and moist.  Eyes: Pupils are equal, round, and reactive to light. Conjunctivae are normal. Right eye exhibits no discharge. Left eye exhibits no discharge.  Neck: Normal range of motion. Neck supple.  Cardiovascular: Normal rate, regular rhythm, normal heart sounds and intact distal pulses. Exam reveals no gallop and no friction rub.  No murmur heard. Pulmonary/Chest: Effort normal and breath sounds normal. No respiratory distress. He exhibits no tenderness.  Abdominal: Soft. Bowel sounds are normal. He exhibits no distension. There is tenderness in the right lower quadrant. There is CVA tenderness. There is no rigidity, no rebound, no guarding, no tenderness at  McBurney's point and negative Murphy's sign.  Genitourinary:  Genitourinary Comments: Chaperone present for exam. Circumcised male. No penile discharge, erythema, tenderness, lesion, or rash. 2 descended testes without swelling, pain, lesions or rash. No inguinal lymphadenopathy or hernia.    Musculoskeletal: Normal range of motion. He exhibits no tenderness.  Lymphadenopathy:    He has no cervical adenopathy.  Neurological: He is alert and oriented to person, place, and time.  Skin: Skin is warm and dry. Capillary refill takes less than 2 seconds. No rash noted.  Psychiatric: His behavior is normal. Judgment and thought content normal.  Nursing note and vitals reviewed.    ED Treatments / Results  Labs (all labs ordered are listed, but only abnormal results are displayed) Labs Reviewed  COMPREHENSIVE METABOLIC PANEL - Abnormal; Notable for the following components:      Result Value   Glucose, Bld 101 (*)    Albumin 3.2 (*)    AST 44 (*)    All other components within normal limits  CBC WITH DIFFERENTIAL/PLATELET - Abnormal; Notable for the following components:   RBC 3.92 (*)    Hemoglobin 12.2 (*)    HCT 37.7 (*)    All other components within normal limits  URINALYSIS, ROUTINE W REFLEX MICROSCOPIC - Abnormal; Notable for the following components:   Color, Urine BROWN (*)    APPearance CLOUDY (*)    Hgb urine dipstick LARGE (*)    Protein, ur 100 (*)    All other components within normal limits  LIPASE, BLOOD    EKG None  Radiology Ct Renal Stone Study  Result Date: 02/18/2018 CLINICAL DATA:  Acute onset of right-sided abdominal pain and diarrhea. EXAM: CT ABDOMEN AND PELVIS WITHOUT CONTRAST TECHNIQUE: Multidetector CT imaging of the abdomen and pelvis was performed following the standard protocol without IV contrast. COMPARISON:  CT of the abdomen and pelvis from 01/24/2018 FINDINGS: Lower chest: Bronchiectasis is noted at the lung bases, with mild bibasilar  atelectasis or scarring. The visualized portions of the mediastinum are unremarkable. Hepatobiliary: The liver is unremarkable in appearance. The patient is status post cholecystectomy, with clips noted at the gallbladder fossa. The common bile duct remains normal in caliber. Pancreas: The pancreas is within normal limits. Spleen: The spleen is unremarkable in appearance. Adrenals/Urinary Tract: The adrenal glands are unremarkable in appearance. Moderate to severe right-sided hydronephrosis is noted, with prominence of the right ureter along much of its course. An obstructing 7 x 5 mm stone is noted at the distal right  ureter, 4 cm above the right vesicoureteral junction. Nonspecific bilateral perinephric stranding is noted bilaterally. No nonobstructing renal stones are identified. A small left renal cyst is noted. Stomach/Bowel: The stomach is unremarkable in appearance. The small bowel is within normal limits. The appendix is normal in caliber, without evidence of appendicitis. The colon is unremarkable in appearance. Vascular/Lymphatic: Scattered calcification is seen along the abdominal aorta and its branches. The abdominal aorta is otherwise grossly unremarkable. The inferior vena cava is grossly unremarkable. No retroperitoneal lymphadenopathy is seen. No pelvic sidewall lymphadenopathy is identified. Minimal chronic stranding is noted about the patient's axillobifemoral bypass graft. Reproductive: The bladder is decompressed and not well assessed. The prostate remains normal in size. Other: No additional soft tissue abnormalities are seen. Musculoskeletal: No acute osseous abnormalities are identified. The visualized musculature is unremarkable in appearance. IMPRESSION: 1. Moderate to severe right-sided hydronephrosis, with an obstructing 7 x 5 mm stone at the distal right ureter, 4 cm above the right vesicoureteral junction. 2. Bronchiectasis at the lung bases, with mild bibasilar atelectasis or scarring.  3. Small left renal cyst. Aortic Atherosclerosis (ICD10-I70.0). Electronically Signed   By: Roanna RaiderJeffery  Chang M.D.   On: 02/18/2018 04:59    Procedures Procedures (including critical care time)  Medications Ordered in ED Medications  fentaNYL (SUBLIMAZE) injection 50 mcg (50 mcg Intravenous Given 02/18/18 0415)  ondansetron (ZOFRAN) injection 4 mg (4 mg Intravenous Given 02/18/18 0415)     Initial Impression / Assessment and Plan / ED Course  I have reviewed the triage vital signs and the nursing notes.  Pertinent labs & imaging results that were available during my care of the patient were reviewed by me and considered in my medical decision making (see chart for details).     Patient presents to the emergency department today for evaluation of right flank pain and right lower abdominal pain consistent with his kidney stone pain.  Patient reports some associated diarrhea but denies any associated vomiting or fever.  Patient does appear to be in discomfort on examination appears chronically ill but not acutely toxic or septic appearing.  Vital signs are reassuring.  Patient is afebrile.  No tachycardia or hypotension is noted.  Patient has mild right-sided CVA tenderness and right lower quadrant abdominal pain without any signs of peritonitis.  Bowel sounds are present in all 4 quadrants.  Heart regular rate and rhythm.  Lungs clear to auscultation bilaterally.  Lab work reveals no significant leukocytosis.  Normal kidney function.  Lipase is normal.  UA does show large amount of RBCs but no other acute signs of infection.  CT scan was obtained given that he has a history of ureteral stones with hospitalization last month.  CT abdomen pelvis does reveal right distal ureteral stone measuring approximate 7 mm with moderate to severe hydronephrosis.  There is no ureteral stone over the left side.  Spoke with Dr. Ronne BinningMcKenzie with urology concerning patient's findings.  Felt that patient can be  managed in the outpatient setting with follow-up.  Patient has had no further pain in the ED.  He has had no intractable vomiting.  Normal serum creatinine.  Patient feels comfortable for discharge.  We will send patient home with pain medication and Flomax with outpatient follow-up.  Patient verbalized understanding.  Pt is hemodynamically stable, in NAD, & able to ambulate in the ED. Evaluation does not show pathology that would require ongoing emergent intervention or inpatient treatment. I explained the diagnosis to the patient. Pain has been managed &  has no complaints prior to dc. Pt is comfortable with above plan and is stable for discharge at this time. All questions were answered prior to disposition. Strict return precautions for f/u to the ED were discussed. Encouraged follow up with PCP.    Final Clinical Impressions(s) / ED Diagnoses   Final diagnoses:  Ureterolithiasis    ED Discharge Orders    None       Wallace Keller 02/18/18 [id number]    Derwood Kaplan, MD 02/21/18 [phone]

## 2018-02-18 NOTE — ED Notes (Addendum)
Pt unable to sign for ED discharge.

## 2018-02-18 NOTE — ED Notes (Signed)
Consult to Urology@06:15am. 

## 2018-02-18 NOTE — ED Triage Notes (Addendum)
Per EMS, pt presents today with abdominal pain and diarrhea. Pain is radiating to right side. Hx of kidney stone. Pt urinated at 1400, but is expressing difficulty to urinate now.

## 2018-02-18 NOTE — ED Notes (Signed)
Bed: [id number] Expected date:  Expected time:  Means of arrival:  Comments: EMS 52 yo male abd pain/diarrhea

## 2018-02-18 NOTE — Discharge Instructions (Signed)
You have been diagnosed with kidney stones.  Drink plenty of fluids to help you pass the stone.  Take  ibuprofen / naproxen as directed with food for mild to moderate pain. Use your pain medication as directed and only as needed for severe pain. Taking flomax as directed will also help to pass the stone. Use Zofran for nausea as directed.  Follow up with the urology clinic listed in regards to your hospital visit.   Return to the ED immediately if you develop fever that persists > 101, uncontrolled pain or vomiting, or other concerns.   Do not drink alcohol, drive or participate in any other potentially dangerous activities while taking opiate pain medication as it may make you sleepy. Do not take this medication with any other sedating medications, either prescription or over-the-counter. If you were prescribed Percocet or Vicodin, do not take these with acetaminophen (Tylenol) as it is already contained within these medications.   This medication is an opiate (or narcotic) pain medication and can be habit forming.  Use it as little as possible to achieve adequate pain control.  Do not use or use it with extreme caution if you have a history of opiate abuse or dependence. This medication is intended for your use only - do not give any to anyone else and keep it in a secure place where nobody else, especially children, have access to it. It will also cause or worsen constipation, so you may want to consider taking an over-the-counter stool softener while you are taking this medication.  

## 2018-07-27 ENCOUNTER — Ambulatory Visit: Payer: Medicaid Other | Admitting: Family

## 2018-07-27 ENCOUNTER — Encounter (HOSPITAL_COMMUNITY): Payer: Medicaid Other

## 2018-08-25 ENCOUNTER — Encounter (HOSPITAL_COMMUNITY): Payer: Medicaid Other

## 2018-08-25 ENCOUNTER — Ambulatory Visit: Payer: Medicaid Other | Admitting: Family

## 2018-09-25 ENCOUNTER — Ambulatory Visit: Payer: Medicaid Other | Admitting: Family

## 2018-09-25 ENCOUNTER — Encounter (HOSPITAL_COMMUNITY): Payer: Medicaid Other

## 2018-09-26 ENCOUNTER — Encounter: Payer: Self-pay | Admitting: Family

## 2018-10-09 ENCOUNTER — Encounter (HOSPITAL_COMMUNITY): Payer: Self-pay

## 2018-10-09 ENCOUNTER — Emergency Department (HOSPITAL_COMMUNITY): Payer: Medicaid Other

## 2018-10-09 ENCOUNTER — Other Ambulatory Visit: Payer: Self-pay

## 2018-10-09 ENCOUNTER — Inpatient Hospital Stay (HOSPITAL_COMMUNITY)
Admission: EM | Admit: 2018-10-09 | Discharge: 2018-10-13 | DRG: 193 | Disposition: A | Discharge status: Skilled Nursing Facility | Payer: Medicaid Other | Attending: Internal Medicine | Admitting: Internal Medicine

## 2018-10-09 ENCOUNTER — Inpatient Hospital Stay (HOSPITAL_COMMUNITY): Payer: Medicaid Other

## 2018-10-09 DIAGNOSIS — E785 Hyperlipidemia, unspecified: Secondary | ICD-10-CM | POA: Diagnosis present

## 2018-10-09 DIAGNOSIS — J189 Pneumonia, unspecified organism: Secondary | ICD-10-CM | POA: Diagnosis not present

## 2018-10-09 DIAGNOSIS — J9601 Acute respiratory failure with hypoxia: Secondary | ICD-10-CM | POA: Diagnosis present

## 2018-10-09 DIAGNOSIS — B370 Candidal stomatitis: Secondary | ICD-10-CM | POA: Diagnosis present

## 2018-10-09 DIAGNOSIS — K219 Gastro-esophageal reflux disease without esophagitis: Secondary | ICD-10-CM | POA: Diagnosis present

## 2018-10-09 DIAGNOSIS — J181 Lobar pneumonia, unspecified organism: Principal | ICD-10-CM | POA: Diagnosis present

## 2018-10-09 DIAGNOSIS — F329 Major depressive disorder, single episode, unspecified: Secondary | ICD-10-CM | POA: Diagnosis present

## 2018-10-09 DIAGNOSIS — N400 Enlarged prostate without lower urinary tract symptoms: Secondary | ICD-10-CM | POA: Diagnosis present

## 2018-10-09 DIAGNOSIS — Z993 Dependence on wheelchair: Secondary | ICD-10-CM | POA: Diagnosis not present

## 2018-10-09 DIAGNOSIS — G7100 Muscular dystrophy, unspecified: Secondary | ICD-10-CM | POA: Diagnosis not present

## 2018-10-09 DIAGNOSIS — I10 Essential (primary) hypertension: Secondary | ICD-10-CM | POA: Diagnosis present

## 2018-10-09 DIAGNOSIS — Z66 Do not resuscitate: Secondary | ICD-10-CM | POA: Diagnosis present

## 2018-10-09 DIAGNOSIS — R0902 Hypoxemia: Secondary | ICD-10-CM | POA: Diagnosis not present

## 2018-10-09 DIAGNOSIS — G7111 Myotonic muscular dystrophy: Secondary | ICD-10-CM | POA: Diagnosis present

## 2018-10-09 DIAGNOSIS — R0602 Shortness of breath: Secondary | ICD-10-CM | POA: Diagnosis not present

## 2018-10-09 DIAGNOSIS — Z87891 Personal history of nicotine dependence: Secondary | ICD-10-CM | POA: Diagnosis not present

## 2018-10-09 DIAGNOSIS — Z86718 Personal history of other venous thrombosis and embolism: Secondary | ICD-10-CM

## 2018-10-09 DIAGNOSIS — R1312 Dysphagia, oropharyngeal phase: Secondary | ICD-10-CM | POA: Diagnosis present

## 2018-10-09 DIAGNOSIS — K228 Other specified diseases of esophagus: Secondary | ICD-10-CM | POA: Diagnosis present

## 2018-10-09 DIAGNOSIS — Z79899 Other long term (current) drug therapy: Secondary | ICD-10-CM

## 2018-10-09 DIAGNOSIS — Z7982 Long term (current) use of aspirin: Secondary | ICD-10-CM | POA: Diagnosis not present

## 2018-10-09 DIAGNOSIS — R1310 Dysphagia, unspecified: Secondary | ICD-10-CM | POA: Diagnosis not present

## 2018-10-09 DIAGNOSIS — E876 Hypokalemia: Secondary | ICD-10-CM | POA: Diagnosis present

## 2018-10-09 HISTORY — DX: Dysphagia, unspecified: R13.10

## 2018-10-09 LAB — TROPONIN I: Troponin I: 0.03 ng/mL (ref <0.03)

## 2018-10-09 LAB — LACTIC ACID, PLASMA
Lactic Acid, Venous: 1.3 mmol/L (ref 0.5–1.9)
Lactic Acid, Venous: 3.2 mmol/L (ref 0.5–1.9)

## 2018-10-09 LAB — CBC WITH DIFFERENTIAL/PLATELET
Abs Immature Granulocytes: 0.03 10*3/uL (ref 0.00–0.07)
Basophils Absolute: 0 10*3/uL (ref 0.0–0.1)
Basophils Relative: 0 %
Eosinophils Absolute: 0 10*3/uL (ref 0.0–0.5)
Eosinophils Relative: 0 %
HCT: 46.9 % (ref 39.0–52.0)
Hemoglobin: 14.5 g/dL (ref 13.0–17.0)
Immature Granulocytes: 1 %
Lymphocytes Relative: 38 %
Lymphs Abs: 1.6 10*3/uL (ref 0.7–4.0)
MCH: 28.8 pg (ref 26.0–34.0)
MCHC: 30.9 g/dL (ref 30.0–36.0)
MCV: 93.2 fL (ref 80.0–100.0)
Monocytes Absolute: 0.3 10*3/uL (ref 0.1–1.0)
Monocytes Relative: 6 %
Neutro Abs: 2.3 10*3/uL (ref 1.7–7.7)
Neutrophils Relative %: 55 %
Platelets: 109 10*3/uL — ABNORMAL LOW (ref 150–400)
RBC: 5.03 MIL/uL (ref 4.22–5.81)
RDW: 15.2 % (ref 11.5–15.5)
WBC: 4.2 10*3/uL (ref 4.0–10.5)
nRBC: 0 % (ref 0.0–0.2)

## 2018-10-09 LAB — BASIC METABOLIC PANEL
Anion gap: 11 (ref 5–15)
BUN: 9 mg/dL (ref 6–20)
CO2: 32 mmol/L (ref 22–32)
Calcium: 9.2 mg/dL (ref 8.9–10.3)
Chloride: 103 mmol/L (ref 98–111)
Creatinine, Ser: 0.51 mg/dL — ABNORMAL LOW (ref 0.61–1.24)
GFR calc Af Amer: >60 mL/min (ref >60)
GFR calc non Af Amer: >60 mL/min (ref >60)
Glucose, Bld: 104 mg/dL — ABNORMAL HIGH (ref 70–99)
Potassium: 3.8 mmol/L (ref 3.5–5.1)
Sodium: 146 mmol/L — ABNORMAL HIGH (ref 135–145)

## 2018-10-09 LAB — BRAIN NATRIURETIC PEPTIDE: B Natriuretic Peptide: 24.6 pg/mL (ref 0.0–100.0)

## 2018-10-09 MED ORDER — BENZONATATE 100 MG PO CAPS
100.0000 mg | ORAL_CAPSULE | Freq: Three times a day (TID) | ORAL | Status: DC
  Administered 2018-10-09 – 2018-10-12 (×9): 100 mg via ORAL
  Filled 2018-10-09 (×10): qty 1

## 2018-10-09 MED ORDER — MAGNESIUM OXIDE 400 (241.3 MG) MG PO TABS
400.0000 mg | ORAL_TABLET | Freq: Every day | ORAL | Status: DC
  Administered 2018-10-10 – 2018-10-12 (×3): 400 mg via ORAL
  Filled 2018-10-09 (×4): qty 1

## 2018-10-09 MED ORDER — ONDANSETRON HCL 4 MG PO TABS: 4.0000 mg | ORAL_TABLET | Freq: Four times a day (QID) | ORAL | Status: DC | PRN

## 2018-10-09 MED ORDER — ASPIRIN EC 81 MG PO TBEC
81.0000 mg | DELAYED_RELEASE_TABLET | Freq: Every day | ORAL | Status: DC
  Administered 2018-10-10 – 2018-10-12 (×3): 81 mg via ORAL
  Filled 2018-10-09 (×4): qty 1

## 2018-10-09 MED ORDER — IPRATROPIUM-ALBUTEROL 0.5-2.5 (3) MG/3ML IN SOLN
3.0000 mL | Freq: Once | RESPIRATORY_TRACT | Status: AC
Start: 2018-10-09 — End: 2018-10-09
  Administered 2018-10-09: 3 mL via RESPIRATORY_TRACT
  Filled 2018-10-09: qty 3

## 2018-10-09 MED ORDER — FENOFIBRATE 54 MG PO TABS
54.0000 mg | ORAL_TABLET | Freq: Every day | ORAL | Status: DC
  Administered 2018-10-10 – 2018-10-12 (×3): 54 mg via ORAL
  Filled 2018-10-09 (×5): qty 1

## 2018-10-09 MED ORDER — ATORVASTATIN CALCIUM 40 MG PO TABS
40.0000 mg | ORAL_TABLET | Freq: Every evening | ORAL | Status: DC
  Administered 2018-10-11 – 2018-10-12 (×2): 40 mg via ORAL
  Filled 2018-10-09 (×3): qty 1

## 2018-10-09 MED ORDER — ALBUTEROL SULFATE (2.5 MG/3ML) 0.083% IN NEBU
2.5000 mg | INHALATION_SOLUTION | Freq: Once | RESPIRATORY_TRACT | Status: AC
  Administered 2018-10-09: 2.5 mg via RESPIRATORY_TRACT
  Filled 2018-10-09: qty 3

## 2018-10-09 MED ORDER — LEVOFLOXACIN IN D5W 750 MG/150ML IV SOLN
750.0000 mg | INTRAVENOUS | Status: DC
  Administered 2018-10-09: 750 mg via INTRAVENOUS
  Filled 2018-10-09 (×2): qty 150

## 2018-10-09 MED ORDER — IOPAMIDOL (ISOVUE-370) INJECTION 76%
100.0000 mL | Freq: Once | INTRAVENOUS | Status: AC | PRN
  Administered 2018-10-09: 100 mL via INTRAVENOUS

## 2018-10-09 MED ORDER — FAMOTIDINE 20 MG PO TABS
20.0000 mg | ORAL_TABLET | Freq: Two times a day (BID) | ORAL | Status: DC
  Administered 2018-10-09 – 2018-10-12 (×7): 20 mg via ORAL
  Filled 2018-10-09 (×8): qty 1

## 2018-10-09 MED ORDER — DEXTROSE-NACL 5-0.9 % IV SOLN
INTRAVENOUS | Status: DC
  Administered 2018-10-09 – 2018-10-11 (×3): via INTRAVENOUS

## 2018-10-09 MED ORDER — ACETAMINOPHEN 650 MG RE SUPP: 650.0000 mg | Freq: Four times a day (QID) | RECTAL | Status: DC | PRN

## 2018-10-09 MED ORDER — ONDANSETRON HCL 4 MG/2ML IJ SOLN: 4.0000 mg | Freq: Four times a day (QID) | INTRAMUSCULAR | Status: DC | PRN

## 2018-10-09 MED ORDER — GUAIFENESIN ER 600 MG PO TB12
600.0000 mg | ORAL_TABLET | Freq: Two times a day (BID) | ORAL | Status: DC
  Administered 2018-10-09 – 2018-10-12 (×6): 600 mg via ORAL
  Filled 2018-10-09 (×8): qty 1

## 2018-10-09 MED ORDER — VITAMIN B-12 1000 MCG PO TABS
1000.0000 ug | ORAL_TABLET | Freq: Every day | ORAL | Status: DC
  Administered 2018-10-10 – 2018-10-12 (×2): 1000 ug via ORAL
  Filled 2018-10-09 (×4): qty 1

## 2018-10-09 MED ORDER — MIRTAZAPINE 15 MG PO TABS
15.0000 mg | ORAL_TABLET | Freq: Every day | ORAL | Status: DC
  Administered 2018-10-09 – 2018-10-12 (×3): 15 mg via ORAL
  Filled 2018-10-09 (×4): qty 1

## 2018-10-09 MED ORDER — SODIUM CHLORIDE (PF) 0.9 % IJ SOLN
INTRAMUSCULAR | Status: AC
  Filled 2018-10-09: qty 50

## 2018-10-09 MED ORDER — IOPAMIDOL (ISOVUE-370) INJECTION 76%
INTRAVENOUS | Status: AC
  Filled 2018-10-09: qty 100

## 2018-10-09 MED ORDER — ADULT MULTIVITAMIN W/MINERALS CH
1.0000 | ORAL_TABLET | Freq: Every day | ORAL | Status: DC
  Administered 2018-10-12: 1 via ORAL
  Filled 2018-10-09 (×3): qty 1

## 2018-10-09 MED ORDER — TAMSULOSIN HCL 0.4 MG PO CAPS
0.4000 mg | ORAL_CAPSULE | Freq: Every day | ORAL | Status: DC
  Administered 2018-10-10 – 2018-10-12 (×3): 0.4 mg via ORAL
  Filled 2018-10-09 (×4): qty 1

## 2018-10-09 MED ORDER — ACETAMINOPHEN 325 MG PO TABS: 650.0000 mg | ORAL_TABLET | Freq: Four times a day (QID) | ORAL | Status: DC | PRN

## 2018-10-09 MED ORDER — HYDROCODONE-ACETAMINOPHEN 5-325 MG PO TABS
1.0000 | ORAL_TABLET | ORAL | Status: DC | PRN
  Filled 2018-10-09: qty 1

## 2018-10-09 MED ORDER — ALBUTEROL (5 MG/ML) CONTINUOUS INHALATION SOLN
10.0000 mg/h | INHALATION_SOLUTION | Freq: Once | RESPIRATORY_TRACT | Status: AC
  Administered 2018-10-09: 10 mg/h via RESPIRATORY_TRACT
  Filled 2018-10-09: qty 20

## 2018-10-09 MED ORDER — IPRATROPIUM BROMIDE 0.02 % IN SOLN
1.0000 mg | Freq: Once | RESPIRATORY_TRACT | Status: AC
  Administered 2018-10-09: 1 mg via RESPIRATORY_TRACT
  Filled 2018-10-09: qty 5

## 2018-10-09 MED ORDER — ENOXAPARIN SODIUM 40 MG/0.4ML ~~LOC~~ SOLN
40.0000 mg | SUBCUTANEOUS | Status: DC
  Administered 2018-10-09 – 2018-10-12 (×3): 40 mg via SUBCUTANEOUS
  Filled 2018-10-09 (×4): qty 0.4

## 2018-10-09 MED ORDER — IPRATROPIUM-ALBUTEROL 0.5-2.5 (3) MG/3ML IN SOLN
3.0000 mL | Freq: Four times a day (QID) | RESPIRATORY_TRACT | Status: DC
  Administered 2018-10-09: 3 mL via RESPIRATORY_TRACT
  Filled 2018-10-09: qty 3

## 2018-10-09 MED ORDER — IPRATROPIUM-ALBUTEROL 0.5-2.5 (3) MG/3ML IN SOLN: 3.0000 mL | RESPIRATORY_TRACT | Status: DC | PRN

## 2018-10-09 MED ORDER — METHOCARBAMOL 500 MG PO TABS: 500.0000 mg | ORAL_TABLET | Freq: Two times a day (BID) | ORAL | Status: DC | PRN

## 2018-10-09 NOTE — ED Notes (Signed)
Date and time results received: 10/09/18 10:31 AM   Test: troponin Critical Value: 0.03  Name of Provider Notified: Clarene DukeMcManus  Orders Received? Or Actions Taken?: awaiting orders

## 2018-10-09 NOTE — ED Provider Notes (Signed)
[facility]-EMERGENCY DEPT Provider Note   CSN: [id number] Arrival date & time: 10/09/18  1914     History   Chief Complaint Chief Complaint  Patient presents with  . Shortness of Breath    HPI Malik [name] is a 52 y.o. male.  HPI Pt was seen at 0925.  Per EMS and pt, c/o gradual onset and worsening of persistent cough and SOB for the past 1 week, worse over the past 2 days. Pt continues to smoke cigarettes. EMS states pt's O2 Sats were "80's" on their arrival to scene. Pt is not on O2. Denies CP/palpitations, no back pain, no abd pain, no N/V/D, no fevers, no rash.    Past Medical History:  Diagnosis Date  . DVT (deep venous thrombosis) (HCC)    left brachial vein DVT 01/29/16  . Dysphagia   . Hiccups   . High blood pressure   . History of kidney stones   . Hypertension   . 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 Abdominal pain 01/06/2018  . Pressure injury of skin 01/06/2018  . Ureteral stone with hydronephrosis 01/06/2018  . Hypernatremia 01/06/2018  . Bilateral ureteral calculi   . PAD (peripheral artery disease) (HCC) 06/07/2016  . Leg pain, bilateral 06/02/2016  . Esophageal obstruction due to food impaction   . DVT of upper extremity (deep vein thrombosis) (HCC) 01/29/2016  . Residence in long term care facility 03/04/2014  . EKG abnormality 11/09/2013  . Health care maintenance 06/22/2013  . Epidermal cyst 06/22/2013  . Skin lesion of hand 06/22/2013  . 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 Hypertension, benign 01/20/2012  . Unspecified vitamin D deficiency 08/05/2011  . Vitamin B 12 deficiency 07/14/2011  . 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 Muscle weakness (generalized) 04/16/2011  . Weight finding 04/16/2011  . History of tobacco use 04/16/2011    Past Surgical History:  Procedure Laterality Date  . AXILLARY-FEMORAL BYPASS GRAFT Right 03/07/2017   Procedure: RIGHT  AXILLA-BIFEMORAL BYPASS GRAFT USING HEMASHIELD GOLD VASCULAR GRAFT;  Surgeon: [name], MD;  Location: [facility] OR;  Service: Vascular;  Laterality: Right;  . CHOLECYSTECTOMY    . ENDARTERECTOMY FEMORAL Left 03/07/2017   Procedure: ENDARTERECTOMY LEFT FEMORAL ARTERY ;  Surgeon: [name], MD;  Location: [facility] OR;  Service: Vascular;  Laterality: Left;  . ESOPHAGOGASTRODUODENOSCOPY N/A 05/10/2016   Procedure: ESOPHAGOGASTRODUODENOSCOPY (EGD);  Surgeon: [name], MD;  Location: [facility];  Service: Endoscopy;  Laterality: N/A;  Please discuss sedation possibilities with Dr. [name]  . ESOPHAGOGASTRODUODENOSCOPY N/A 08/29/2017   Procedure: ESOPHAGOGASTRODUODENOSCOPY (EGD);  Surgeon: [name], MD;  Location: [facility];  Service: Endoscopy;  Laterality: N/A;  . MULTIPLE TOOTH EXTRACTIONS  2007   Pt had all teeth pulled, has not yet gotten dentures.   Marland Kitchen PATCH ANGIOPLASTY Left 03/07/2017   Procedure: PATCH ANGIOPLASTY LEFT FEMORAL ARTERY USING XENOSURE BIOLOGIC PATCH;  Surgeon: [name], MD;  Location: [facility] OR;  Service: Vascular;  Laterality: Left;        Home Medications    Prior to Admission medications   Medication Sig Start Date End Date Taking? Authorizing Provider  aspirin EC 81 MG tablet Take 1 tablet (81 mg total) by mouth daily. 01/27/18  Yes [name], MD  atorvastatin (LIPITOR) 40 MG tablet Take 40 mg by mouth every evening.   Yes [provider]  benzonatate (TESSALON) 100 MG capsule Take  100 mg by mouth 3 (three) times daily.   Yes [provider]  famotidine (PEPCID) 20 MG tablet Take 20 mg by mouth 2 (two) times daily.   Yes [provider]  fenofibrate (TRICOR) 145 MG tablet Take 145 mg by mouth daily.   Yes [provider]  guaiFENesin (MUCINEX) 600 MG 12 hr tablet Take 600 mg by mouth every 12 (twelve) hours.   Yes [provider]  HYDROcodone-acetaminophen (NORCO/VICODIN) 5-325 MG tablet Take 1  tablet by mouth every 4 (four) hours as needed. Patient taking differently: Take 1 tablet by mouth every 4 (four) hours as needed for moderate pain.  02/18/18  Yes Leaphart, Iantha FallenKenneth T, PA-C  magnesium oxide (MAG-OX) 400 MG tablet Take 1 tablet (400 mg total) by mouth daily. 01/07/18  Yes Albertine GratesXu, Fang, MD  methocarbamol (ROBAXIN) 500 MG tablet Take 1 tablet (500 mg total) by mouth 2 (two) times daily as needed for muscle spasms. 01/02/18  Yes Alvira MondaySchlossman, Erin, MD  mirtazapine (REMERON) 15 MG tablet Take 15 mg by mouth at bedtime.   Yes [provider]  multivitamin (ONE-A-DAY MEN'S) TABS tablet Take 1 tablet by mouth daily. 01/27/18  Yes Palma HolterGunadasa, Kanishka G, MD  potassium chloride SA (K-DUR,KLOR-CON) 20 MEQ tablet Take 2 tablets (40 mEq total) by mouth every Monday, Wednesday, and Friday. 01/09/18  Yes Albertine GratesXu, Fang, MD  tamsulosin [facility](FLOMAX) 0.4 MG CAPS capsule Take 1 capsule (0.4 mg total) by mouth daily after breakfast. 02/18/18  Yes Leaphart, Lynann BeaverKenneth T, PA-C  vitamin B-12 (CYANOCOBALAMIN) 1000 MCG tablet Take 1,000 mcg by mouth daily.   Yes [provider]    Family History Family History  Problem Relation Age of Onset  . COPD Mother   . Diabetes Mother   . Breast cancer Mother   . Cancer Father   . Cancer Sister   . Muscular dystrophy Sister     Social History Social History   Tobacco Use  . Smoking status: Former Smoker    Packs/day: 0.30    Types: Cigarettes    Start date: 11/02/1983    Last attempt to quit: 08/01/2017    Years since quitting: 1.1  . Smokeless tobacco: Never Used  . Tobacco comment: 4-5 cigs per day  Substance Use Topics  . Alcohol use: No  . Drug use: No    Types: Marijuana    Comment: Pt smokes MJ ocassinally.  Denies ever using IV drugs.      Allergies   Patient has no known allergies.   Review of Systems Review of Systems ROS: Statement: All systems negative except as marked or noted in the HPI; Constitutional: Negative for fever and chills. ; ;  Eyes: Negative for eye pain, redness and discharge. ; ; ENMT: Negative for ear pain, hoarseness, nasal congestion, sinus pressure and sore throat. ; ; Cardiovascular: Negative for chest pain, palpitations, diaphoresis, and peripheral edema. ; ; Respiratory: +cough, SOB. Negative for wheezing and stridor. ; ; Gastrointestinal: Negative for nausea, vomiting, diarrhea, abdominal pain, blood in stool, hematemesis, jaundice and rectal bleeding. . ; ; Genitourinary: Negative for dysuria, flank pain and hematuria. ; ; Musculoskeletal: Negative for back pain and neck pain. Negative for swelling and trauma.; ; Skin: Negative for pruritus, rash, abrasions, blisters, bruising and skin lesion.; ; Neuro: Negative for headache, lightheadedness and neck stiffness. Negative for weakness, altered level of consciousness, altered mental status, extremity weakness, paresthesias, involuntary movement, seizure and syncope.       Physical Exam Updated Vital  Signs BP 127/84   Pulse 66   Temp 97.9 F (36.6 C) (Oral)   Resp (!) 22   SpO2 92%    Patient Vitals for the past 24 hrs:  BP Temp Temp src Pulse Resp SpO2  10/09/18 1038 - - - - - 97 %  10/09/18 1032 - - - - - 90 %  10/09/18 1030 118/82 - - 77 (!) 29 (!) 77 %  10/09/18 1011 - - - 79 (!) 25 95 %  10/09/18 1000 129/78 - - 74 (!) 30 93 %  10/09/18 0945 127/84 - - 66 (!) 22 92 %  10/09/18 0926 126/85 97.9 F (36.6 C) Oral 64 (!) 25 (!) 86 %    Physical Exam 0930: Physical examination:  Nursing notes reviewed; Vital signs and O2 SAT reviewed;  Constitutional: Well developed, Well nourished, Well hydrated, In no acute distress; Head:  Normocephalic, atraumatic; Eyes: EOMI, PERRL, No scleral icterus; ENMT: Mouth and pharynx normal, Mucous membranes moist; Neck: Supple, Full range of motion, No lymphadenopathy; Cardiovascular: Regular rate and rhythm, No gallop; Respiratory: Breath sounds diminished & equal bilaterally, No wheezes.  Speaking short sentences, Normal  respiratory effort/excursion; Chest: Nontender, Movement normal; Abdomen: Soft, Nontender, Nondistended, Normal bowel sounds; Genitourinary: No CVA tenderness; Extremities: Peripheral pulses normal, No tenderness, No edema, No calf edema or asymmetry.; Neuro: AA&Ox3, Major CN grossly intact.  Speech clear. No gross focal motor or sensory deficits in extremities.; Skin: Color normal, Warm, Dry.     ED Treatments / Results  Labs (all labs ordered are listed, but only abnormal results are displayed)   EKG EKG Interpretation  Date/Time:  Monday October 09 2018 09:28:31 EST Ventricular Rate:  63 PR Interval:    QRS Duration: 88 QT Interval:  398 QTC Calculation: 408 R Axis:   8 Text Interpretation:  Sinus rhythm Left ventricular hypertrophy ST elev, probable normal early repol pattern When compared with ECG of 01/02/2018 No significant change was found Confirmed by Samuel Jester [id number] on 10/09/2018 9:44:29 AM   Radiology   Procedures Procedures (including critical care time)  Medications Ordered in ED Medications  ipratropium-albuterol (DUONEB) 0.5-2.5 (3) MG/3ML nebulizer solution 3 mL (3 mLs Nebulization Given 10/09/18 0935)  albuterol (PROVENTIL) (2.5 MG/3ML) 0.083% nebulizer solution 2.5 mg (2.5 mg Nebulization Given 10/09/18 0935)     Initial Impression / Assessment and Plan / ED Course  I have reviewed the triage vital signs and the nursing notes.  Pertinent labs & imaging results that were available during my care of the patient were reviewed by me and considered in my medical decision making (see chart for details).  MDM Reviewed: previous chart, nursing note and vitals Reviewed previous: labs and ECG Interpretation: labs, x-ray and ECG Total time providing critical care: 30-74 minutes. This excludes time spent performing separately reportable procedures and services. Consults: admitting MD   CRITICAL CARE Performed by: Samuel Jester Total critical care  time: 35 minutes Critical care time was exclusive of separately billable procedures and treating other patients. Critical care was necessary to treat or prevent imminent or life-threatening deterioration. Critical care was time spent personally by me on the following activities: development of treatment plan with patient and/or surrogate as well as nursing, discussions with consultants, evaluation of patient's response to treatment, examination of patient, obtaining history from patient or surrogate, ordering and performing treatments and interventions, ordering and review of laboratory studies, ordering and review of radiographic studies, pulse oximetry and re-evaluation of patient's condition.  Results for  orders placed or performed during the hospital encounter of 10/09/18  Basic metabolic panel  Result Value Ref Range   Sodium 146 (H) 135 - 145 mmol/L   Potassium 3.8 3.5 - 5.1 mmol/L   Chloride 103 98 - 111 mmol/L   CO2 32 22 - 32 mmol/L   Glucose, Bld 104 (H) 70 - 99 mg/dL   BUN 9 6 - 20 mg/dL   Creatinine, Ser 1.61 (L) 0.61 - 1.24 mg/dL   Calcium 9.2 8.9 - 09.6 mg/dL   GFR calc non Af Amer >60 >60 mL/min   GFR calc Af Amer >60 >60 mL/min   Anion gap 11 5 - 15  Troponin I - Once  Result Value Ref Range   Troponin I 0.03 (HH) <0.03 ng/mL  Lactic acid, plasma  Result Value Ref Range   Lactic Acid, Venous 1.3 0.5 - 1.9 mmol/L  Brain natriuretic peptide  Result Value Ref Range   B Natriuretic Peptide 24.6 0.0 - 100.0 pg/mL  CBC with Differential/Platelet  Result Value Ref Range   WBC 4.2 4.0 - 10.5 K/uL   RBC 5.03 4.22 - 5.81 MIL/uL   Hemoglobin 14.5 13.0 - 17.0 g/dL   HCT 04.5 40.9 - 81.1 %   MCV 93.2 80.0 - 100.0 fL   MCH 28.8 26.0 - 34.0 pg   MCHC 30.9 30.0 - 36.0 g/dL   RDW 91.4 78.2 - 95.6 %   Platelets 109 (L) 150 - 400 K/uL   nRBC 0.0 0.0 - 0.2 %   Neutrophils Relative % 55 %   Neutro Abs 2.3 1.7 - 7.7 K/uL   Lymphocytes Relative 38 %   Lymphs Abs 1.6 0.7 - 4.0 K/uL    Monocytes Relative 6 %   Monocytes Absolute 0.3 0.1 - 1.0 K/uL   Eosinophils Relative 0 %   Eosinophils Absolute 0.0 0.0 - 0.5 K/uL   Basophils Relative 0 %   Basophils Absolute 0.0 0.0 - 0.1 K/uL   Immature Granulocytes 1 %   Abs Immature Granulocytes 0.03 0.00 - 0.07 K/uL   Reactive, Benign Lymphocytes PRESENT    Dg Chest Port 1 View Result Date: 10/09/2018 CLINICAL DATA:  Shortness of breath and nonproductive cough for the past week. Decreased oxygen saturation. EXAM: PORTABLE CHEST 1 VIEW COMPARISON:  Chest x-ray of August 29, 2017 FINDINGS: The lungs are adequately inflated. There's hazy increased density in the lingula. There's no pleural effusion or pneumothorax. The heart is top-normal in size. The pulmonary vascularity is normal. The trachea is midline. The bony thorax exhibits no acute abnormality. IMPRESSION: Lingular atelectasis or pneumonia. Followup PA and lateral chest X-ray is recommended in 3-4 weeks following trial of antibiotic therapy to ensure resolution and exclude underlying malignancy. Electronically Signed   By: David  [place] M.D.   On: 10/09/2018 10:11    1210:  IV abx started for CAP. Hour long neb given for hypoxia on arrival with slow improvement. T/C returned from [facility]. Ella Jubilee, case discussed, including:  HPI, pertinent PM/SHx, VS/PE, dx testing, ED course and treatment:  Agreeable to admit.     Final Clinical Impressions(s) / ED Diagnoses   Final diagnoses:  None    ED Discharge Orders    None       Samuel Jester, DO 10/13/18 1356

## 2018-10-09 NOTE — ED Notes (Signed)
ED TO INPATIENT HANDOFF REPORT  Name/Age/Gender Malik [name] 52 y.o. male  Code Status Code Status History    Date Active Date Inactive Code Status Order ID Comments User Context   01/06/2018 2310 01/07/2018 1757 DNR [id number]  Kakrakandy, Arshad N, MD Inpatient   03/07/2017 1340 03/11/2017 1642 DNR [id number]  Rhyne, Samantha J, PA-C Inpatient   03/07/2017 1340 03/07/2017 1340 Full Code [id number]  Rhyne, Samantha J, PA-C Inpatient   05/26/2013 2244 05/28/2013 2049 DNR [id number]  Hess, Bryan R, DO Inpatient   05/26/2013 2118 05/26/2013 2244 DNR [id number]  Hess, Bryan R, DO ED    Questions for Most Recent Historical Code Status (Order [id number])    Question Answer Comment   In the event of cardiac or respiratory ARREST Do not call a "code blue"    In the event of cardiac or respiratory ARREST Do not perform Intubation, CPR, defibrillation or ACLS    In the event of cardiac or respiratory ARREST Use medication by any route, position, wound care, and other measures to relive pain and suffering. May use oxygen, suction and manual treatment of airway obstruction as needed for comfort.         Advance Directive Documentation     Most Recent Value  Type of Advance Directive  Out of facility DNR (pink MOST or yellow form)  Pre-existing out of facility DNR order (yellow form or pink MOST form)  -  "MOST" Form in Place?  -      Home/SNF/Other Nursing Home  Chief Complaint shob  Level of Care/Admitting Diagnosis ED Disposition    ED Disposition Condition Comment   Admit  Hospital Area: [facility] [[id number]]  Level of Care: Telemetry [5]  Admit to tele based on following criteria: Other see comments  Comments: respiratory failure  Diagnosis: Hypoxia [300808]  Admitting Physician: ARRIEN, MAURICIO DANIEL [[id number]]  Attending Physician: ARRIEN, MAURICIO DANIEL [[id number]]  Estimated length of stay: 3 - 4 days  Certification:: I certify this patient will need inpatient services  for at least 2 midnights  PT Class (Do Not Modify): Inpatient [101]  PT Acc Code (Do Not Modify): Private [1]       Medical History Past Medical History:  Diagnosis Date  . DVT (deep venous thrombosis) (HCC)    left brachial vein DVT 01/29/16  . Dysphagia   . Hiccups   . High blood pressure   . History of kidney stones   . Hypertension   . 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HCC)     Allergies No Known Allergies  IV Location/Drains/Wounds Patient Lines/Drains/Airways Status   Active Line/Drains/Airways    Name:   Placement date:   Placement time:   Site:   Days:   Peripheral IV 10/09/18 Right Antecubital   10/09/18    0945    Antecubital   less than 1   External Urinary Catheter   01/06/18    2253    -   276   Incision (Closed) 03/07/17 Chest Right   03/07/17    0859     581   Incision (Closed) 03/07/17 Groin Right   03/07/17    0859     581   Incision (Closed) 03/07/17 Groin Left   03/07/17    0859     581   Pressure Injury 01/06/18 Stage II -  Partial thickness loss of dermis presenting as a shallow open ulcer with a red, pink wound bed without slough.   01/06/18      2230     276          Labs/Imaging Results for orders placed or performed during the hospital encounter of 10/09/18 (from the past 48 hour(s))  Basic metabolic panel     Status: Abnormal   Collection Time: 10/09/18  9:32 AM  Result Value Ref Range   Sodium 146 (H) 135 - 145 mmol/L   Potassium 3.8 3.5 - 5.1 mmol/L   Chloride 103 98 - 111 mmol/L   CO2 32 22 - 32 mmol/L   Glucose, Bld 104 (H) 70 - 99 mg/dL   BUN 9 6 - 20 mg/dL   Creatinine, Ser 0.51 (L) 0.61 - 1.24 mg/dL   Calcium 9.2 8.9 - 10.3 mg/dL   GFR calc non Af Amer >60 >60 mL/min   GFR calc Af Amer >60 >60 mL/min   Anion gap 11 5 - 15    Comment: Performed at [facility], [address] [place], [place] [address]  Troponin I - Once     Status: Abnormal   Collection Time: 10/09/18  9:32 AM  Result Value Ref Range   Troponin I  0.03 (HH) <0.03 ng/mL    Comment: CRITICAL RESULT CALLED TO, READ BACK BY AND VERIFIED WITH: ROSSER,M. RN AT 1024 10/09/18 MULLINS,T Performed at [facility], [address] [place], [place] [address]   Lactic acid, plasma     Status: None   Collection Time: 10/09/18  9:32 AM  Result Value Ref Range   Lactic Acid, Venous 1.3 0.5 - 1.9 mmol/L    Comment: Performed at [facility], [address] [place], [place] [address]  Lactic acid, plasma     Status: Abnormal   Collection Time: 10/09/18  9:32 AM  Result Value Ref Range   Lactic Acid, Venous 3.2 (HH) 0.5 - 1.9 mmol/L    Comment: CRITICAL RESULT CALLED TO, READ BACK BY AND VERIFIED WITH: TAI,M. RN AT 1410 10/09/18 MULLINS,T Performed at [facility], [address] [place], [place] [address]   Brain natriuretic peptide     Status: None   Collection Time: 10/09/18 10:55 AM  Result Value Ref Range   B Natriuretic Peptide 24.6 0.0 - 100.0 pg/mL    Comment: Performed at [facility], [address] [place], [place] [address]  CBC with Differential/Platelet     Status: Abnormal   Collection Time: 10/09/18 10:55 AM  Result Value Ref Range   WBC 4.2 4.0 - 10.5 K/uL   RBC 5.03 4.22 - 5.81 MIL/uL   Hemoglobin 14.5 13.0 - 17.0 g/dL   HCT 46.9 39.0 - 52.0 %   MCV 93.2 80.0 - 100.0 fL   MCH 28.8 26.0 - 34.0 pg   MCHC 30.9 30.0 - 36.0 g/dL   RDW 15.2 11.5 - 15.5 %   Platelets 109 (L) 150 - 400 K/uL    Comment: REPEATED TO VERIFY PLATELET COUNT CONFIRMED BY SMEAR SPECIMEN CHECKED FOR CLOTS Immature Platelet Fraction may be clinically indicated, consider ordering this additional test [id number]    nRBC 0.0 0.0 - 0.2 %   Neutrophils Relative % 55 %   Neutro Abs 2.3 1.7 - 7.7 K/uL   Lymphocytes Relative 38 %   Lymphs Abs 1.6 0.7 - 4.0 K/uL   Monocytes Relative 6 %   Monocytes Absolute 0.3 0.1 - 1.0 K/uL   Eosinophils Relative 0 %   Eosinophils Absolute  0.0 0.0 - 0.5 K/uL     Basophils Relative 0 %   Basophils Absolute 0.0 0.0 - 0.1 K/uL   Immature Granulocytes 1 %   Abs Immature Granulocytes 0.03 0.00 - 0.07 K/uL   Reactive, Benign Lymphocytes PRESENT     Comment: Performed at [facility], [place] [address], [place], [place] [address]   Dg Chest Port 1 View  Result Date: 10/09/2018 CLINICAL DATA:  Shortness of breath and nonproductive cough for the past week. Decreased oxygen saturation. EXAM: PORTABLE CHEST 1 VIEW COMPARISON:  Chest x-ray of August 29, 2017 FINDINGS: The lungs are adequately inflated. There's hazy increased density in the lingula. There's no pleural effusion or pneumothorax. The heart is top-normal in size. The pulmonary vascularity is normal. The trachea is midline. The bony thorax exhibits no acute abnormality. IMPRESSION: Lingular atelectasis or pneumonia. Followup PA and lateral chest X-ray is recommended in 3-4 weeks following trial of antibiotic therapy to ensure resolution and exclude underlying malignancy. Electronically Signed   By: David  Martinique M.D.   On: 10/09/2018 10:11   EKG Interpretation  Date/Time:  Monday October 09 2018 09:28:31 EST Ventricular Rate:  63 PR Interval:    QRS Duration: 88 QT Interval:  398 QTC Calculation: 408 R Axis:   8 Text Interpretation:  Sinus rhythm Left ventricular hypertrophy ST elev, probable normal early repol pattern When compared with ECG of 01/02/2018 No significant change was found Confirmed by Francine Graven [id number] on 10/09/2018 9:44:29 AM   Pending Labs Unresulted Labs (From admission, onward)    Start     Ordered   10/09/18 0927  CBC with Differential  Once,   STAT     10/09/18 0926   10/09/18 0927  Urinalysis, Routine w reflex microscopic  ONCE - STAT,   STAT     10/09/18 0926   Signed and Held  CBC  (enoxaparin (LOVENOX)    CrCl >/= 30 ml/min)  Once,   R    Comments:  Baseline for enoxaparin therapy IF NOT ALREADY DRAWN.  Notify MD if PLT < 100  K.    Signed and Held   Signed and Held  Creatinine, serum  (enoxaparin (LOVENOX)    CrCl >/= 30 ml/min)  Once,   R    Comments:  Baseline for enoxaparin therapy IF NOT ALREADY DRAWN.    Signed and Held   Signed and Held  Creatinine, serum  (enoxaparin (LOVENOX)    CrCl >/= 30 ml/min)  Weekly,   R    Comments:  while on enoxaparin therapy    Signed and Held   Signed and Held  Basic metabolic panel  Tomorrow morning,   R     Signed and Held   Signed and Held  CBC  Tomorrow morning,   R     Signed and Held          Vitals/Pain Today's Vitals   10/09/18 1330 10/09/18 1400 10/09/18 1500 10/09/18 1511  BP: 104/77 105/64 107/71   Pulse: 99 94 86   Resp: (!) 24 (!) 21 (!) 26   Temp:    97.6 F (36.4 C)  TempSrc:    Oral  SpO2: 90% 93% 100%   PainSc:        Isolation Precautions No active isolations  Medications Medications  levofloxacin (LEVAQUIN) IVPB 750 mg (0 mg Intravenous Stopped 10/09/18 1347)  ipratropium-albuterol (DUONEB) 0.5-2.5 (3) MG/3ML nebulizer solution 3 mL (3 mLs Nebulization Given 10/09/18 0935)  albuterol (PROVENTIL) (2.5 MG/3ML) 0.083% nebulizer solution 2.5 mg (2.5  mg Nebulization Given 10/09/18 0935)  albuterol (PROVENTIL,VENTOLIN) solution continuous neb (10 mg/hr Nebulization Given 10/09/18 1031)  ipratropium (ATROVENT) nebulizer solution 1 mg (1 mg Nebulization Given 10/09/18 1031)    Mobility non-ambulatory  

## 2018-10-09 NOTE — Progress Notes (Signed)
Pt took meds with applesauce, without complications, IV fluids started. Per pt unable to void at this time; will check shortly since IV fluids given, PO water pushed. NAD, placed on O2 2L via nasal canula to keep O2 saturation above 92%. Pt states "i'm tired" asked for lights off, tv off, bed alarm on, will round at a later time. NAD at this time.

## 2018-10-09 NOTE — Clinical Social Work Note (Signed)
Clinical Social Work Assessment  Patient Details  Name: Malik [name] MRN: [id number] Date of Birth: 08-21-1966  Date of referral:  10/09/18               Reason for consult:  Discharge Planning                Permission sought to share information with:  Family Supports Permission granted to share information::  Yes, Verbal Permission Granted  Name::     Malik RilingDebbie [name] (sister [phone] , Malik JohnBarbara [name] (aunt [phone])  Agency::     Relationship::     Contact Information:     Housing/Transportation Living arrangements for the past 2 months:  Assisted Living Facility([facility]) Source of Information:  Patient Patient Interpreter Needed:  None Criminal Activity/Legal Involvement Pertinent to Current Situation/Hospitalization:  No - Comment as needed Significant Relationships:  Siblings, Other Family Members Lives with:  Facility Resident Do you feel safe going back to the place where you live?  Yes Need for family participation in patient care:     Care giving concerns:  CSW consulted as patient is a current resident at [facility].   Social Worker assessment / plan:  CSW spoke with patient at bedside. Per patient, he currently lives at [facility] and has lived there since March 2019. Patient states he was living at [facility] to [facility] but did not like the facility. Patient states [facility] is not much better. Per patient, he has a roommate that he does not always get along with. Patient states he has support from his sister Malik BlaseDebbie and aunts Malik CreeDarlene and [name]. Patient is wheelchair bound and states he enjoys the independence of being able to do what he wants, when he wants. Patient denies any social work needs at this time.   Employment status:    Insurance information:  [place] Recommendations:  Not assessed at this time Information / Referral to community resources:     Patient/Family's Response to care:   Patient engaged throughout assessment. Patient stated he does not want to be admitted because the food is not good and he would rather be at home in his own bed. Patient denied social work needs at this time.  Patient/Family's Understanding of and Emotional Response to Diagnosis, Current Treatment, and Prognosis:  Patient not wanting to be admitted but understands he is being recommended to go inpatient and is agreeable to plan.   Emotional Assessment Appearance:  Appears older than stated age Attitude/Demeanor/Rapport:  Engaged Affect (typically observed):  Appropriate, Flat, Calm Orientation:  Oriented to Self, Oriented to Place, Oriented to  Time, Oriented to Situation Alcohol / Substance use:    Psych involvement (Current and /or in the community):     Discharge Needs  Concerns to be addressed:  Denies Needs/Concerns at this time Readmission within the last 30 days:  No Current discharge risk:  None Barriers to Discharge:  No Barriers Identified   Malik BalboaMackenzie  [name], LCSWA 10/09/2018, 1:54 PM

## 2018-10-09 NOTE — ED Notes (Signed)
Respiratory called for continuous neb treatment. They are on the way.

## 2018-10-09 NOTE — ED Notes (Signed)
Mcmanus, MDaware this RN will get Lactic lab once antibiotic finishes.

## 2018-10-09 NOTE — ED Triage Notes (Signed)
shob and cough X1week. Non productive and unable to sleep.   Per ems: 70% on RA. No obvious shob.  Non-rebreather 88% Erma-4 litters 92% Lungs clear bilaterally per ems.  Does not use 02 at home.   HX: Peripheral vascular disease.   79%-RA in ED.

## 2018-10-09 NOTE — ED Notes (Signed)
Bed: WA09 Expected date:  Expected time:  Means of arrival:  Comments: EMS 

## 2018-10-09 NOTE — ED Notes (Signed)
Per RN, EDP would like second lactic acid- will attempt to draw after social work steps out of room

## 2018-10-09 NOTE — H&P (Signed)
History and Physical    ARCANGEL MINION ZOX:[id number] DOB: [birth year] DOA: 10/09/2018  PCP: Lovena Neighbours, MD   Patient coming from: Home   Chief Complaint: Dyspnea and hypoxemia.   HPI: Malik [name] is a 52 y.o. male with medical history significant of myotonic muscular dystrophy, hypertension, nephrolithiasis, history of deep vein thrombosis left brachial vein.  Patient presents with worsening symptoms for the last 7 days, consistent with generalized weakness, poor appetite, dizziness and dry cough.  There seems to be no improving factors, worse with movement, denies any associated fevers or chills.  His symptoms have been moderate to severe intensity, predominantly generalized weakness.  He continues to smoke cigarettes but has not do so over the last 7 days.  He is wheelchair-bound for the last 6 years due to his muscular dystrophy.  When EMS was called he was found hypoxic in the 80s, he was brought to the hospital for further evaluation.   ED Course: Patient was found ill looking appearing, deconditioned, hypoxic down to 77% on room air.  For for admission for evaluation.  Review of Systems:  1. General: no fevers, no chills, no weight gain or weight loss/ generalized weakness, no nausea or vomiting.  2. ENT: No runny nose or sore throat, no hearing disturbances 3. Pulmonary: Positive dyspnea, cough, wheezing, but no hemoptysis 4. Cardiovascular: no angina, claudication, lower extremity edema, pnd or orthopnea 5. Gastrointestinal: No nausea or vomiting, no diarrhea or constipation 6. Hematology: No easy bruisability or frequent infections 7. Urology: No dysuria, hematuria or increased urinary frequency 8. Dermatology: No rashes. 9. Neurology: No seizures or paresthesias 10. Musculoskeletal: No joint pain or deformities  Past Medical History:  Diagnosis Date  . DVT (deep venous thrombosis) (HCC)    left brachial vein DVT 01/29/16  . Dysphagia   . Hiccups   . High  blood pressure   . History of kidney stones   . Hypertension   . Myotonic muscular dystrophy [facility])     Past Surgical History:  Procedure Laterality Date  . AXILLARY-FEMORAL BYPASS GRAFT Right 03/07/2017   Procedure: RIGHT AXILLA-BIFEMORAL BYPASS GRAFT USING HEMASHIELD GOLD VASCULAR GRAFT;  Surgeon: [name], MD;  Location: [facility] OR;  Service: Vascular;  Laterality: Right;  . CHOLECYSTECTOMY    . ENDARTERECTOMY FEMORAL Left 03/07/2017   Procedure: ENDARTERECTOMY LEFT FEMORAL ARTERY ;  Surgeon: [name], MD;  Location: [facility] OR;  Service: Vascular;  Laterality: Left;  . ESOPHAGOGASTRODUODENOSCOPY N/A 05/10/2016   Procedure: ESOPHAGOGASTRODUODENOSCOPY (EGD);  Surgeon: [name], MD;  Location: [facility];  Service: Endoscopy;  Laterality: N/A;  Please discuss sedation possibilities with Dr. [name]  . ESOPHAGOGASTRODUODENOSCOPY N/A 08/29/2017   Procedure: ESOPHAGOGASTRODUODENOSCOPY (EGD);  Surgeon: [name], MD;  Location: [facility]COPY;  Service: Endoscopy;  Laterality: N/A;  . MULTIPLE TOOTH EXTRACTIONS  2007   Pt had all teeth pulled, has not yet gotten dentures.   Marland Kitchen PATCH ANGIOPLASTY Left 03/07/2017   Procedure: PATCH ANGIOPLASTY LEFT FEMORAL ARTERY USING XENOSURE BIOLOGIC PATCH;  Surgeon: [name], MD;  Location: [facility] OR;  Service: Vascular;  Laterality: Left;     reports that he quit smoking about 14 months ago. His smoking use included cigarettes. He started smoking about 34 years ago. He smoked 0.30 packs per day. He has never used smokeless tobacco. He reports that he does not drink alcohol or use drugs.  No Known Allergies  Family History  Problem Relation Age of Onset  . COPD Mother   .  Diabetes Mother   . Breast cancer Mother   . Cancer Father   . Cancer Sister   . Muscular dystrophy Sister      Prior to Admission medications   Medication Sig Start Date End Date Taking? Authorizing Provider  aspirin EC 81 MG tablet Take 1 tablet (81 mg total) by  mouth daily. 01/27/18  Yes [name], MD  atorvastatin (LIPITOR) 40 MG tablet Take 40 mg by mouth every evening.   Yes [provider]  benzonatate (TESSALON) 100 MG capsule Take 100 mg by mouth 3 (three) times daily.   Yes [provider]  famotidine (PEPCID) 20 MG tablet Take 20 mg by mouth 2 (two) times daily.   Yes [provider]  fenofibrate (TRICOR) 145 MG tablet Take 145 mg by mouth daily.   Yes [provider]  guaiFENesin (MUCINEX) 600 MG 12 hr tablet Take 600 mg by mouth every 12 (twelve) hours.   Yes [provider]  HYDROcodone-acetaminophen (NORCO/VICODIN) 5-325 MG tablet Take 1 tablet by mouth every 4 (four) hours as needed. Patient taking differently: Take 1 tablet by mouth every 4 (four) hours as needed for moderate pain.  02/18/18  Yes Leaphart, Iantha Fallen T, PA-C  magnesium oxide (MAG-OX) 400 MG tablet Take 1 tablet (400 mg total) by mouth daily. 01/07/18  Yes [name], MD  methocarbamol (ROBAXIN) 500 MG tablet Take 1 tablet (500 mg total) by mouth 2 (two) times daily as needed for muscle spasms. 01/02/18  Yes [name], MD  mirtazapine (REMERON) 15 MG tablet Take 15 mg by mouth at bedtime.   Yes [provider]  multivitamin (ONE-A-DAY MEN'S) TABS tablet Take 1 tablet by mouth daily. 01/27/18  Yes [name], MD  potassium chloride SA (K-DUR,KLOR-CON) 20 MEQ tablet Take 2 tablets (40 mEq total) by mouth every Monday, Wednesday, and Friday. 01/09/18  Yes [name], MD  tamsulosin [facility]) 0.4 MG CAPS capsule Take 1 capsule (0.4 mg total) by mouth daily after breakfast. 02/18/18  Yes Leaphart, Lynann Beaver, PA-C  vitamin B-12 (CYANOCOBALAMIN) 1000 MCG tablet Take 1,000 mcg by mouth daily.   Yes [provider]    Physical Exam: Vitals:   10/09/18 1030 10/09/18 1032 10/09/18 1038 10/09/18 1130  BP: 118/82   130/77  Pulse: 77   81  Resp: (!) 29   20  Temp:      TempSrc:      SpO2: (!) 77% 90% 97% 94%      Vitals:   10/09/18 1030 10/09/18 1032 10/09/18 1038 10/09/18 1130  BP: 118/82   130/77  Pulse: 77   81  Resp: (!) 29   20  Temp:      TempSrc:      SpO2: (!) 77% 90% 97% 94%   General: deconditioned and ill looking appearing.  Neurology: Awake and alert, non focal Head and Neck. Head normocephalic. Neck supple with no adenopathy or thyromegaly.   E ENT: mild pallor, no icterus, oral mucosa moist Cardiovascular: No JVD. S1-S2 present, rhythmic, no gallops, rubs, or murmurs. No lower extremity edema. Pulmonary: decreased breath sounds bilaterally, decreased air movement, no wheezing, or rhonchi but diffuse rales bilaterally. Gastrointestinal. Abdomen with no organomegaly, non tender, no rebound or guarding Skin. No rashes Musculoskeletal: no joint deformities/ positive atrophic extremities, with decreases muscle mass.     Labs on Admission: I have personally reviewed following labs and imaging studies  CBC: Recent Labs  Lab 10/09/18 1055  WBC 4.2  NEUTROABS 2.3  HGB 14.5  HCT 46.9  MCV 93.2  PLT 109*   Basic Metabolic Panel: Recent Labs  Lab 10/09/18 0932  NA 146*  K 3.8  CL 103  CO2 32  GLUCOSE 104*  BUN 9  CREATININE 0.51*  CALCIUM 9.2   GFR: CrCl cannot be calculated (Unknown ideal weight.). Liver Function Tests: No results for input(s): AST, ALT, ALKPHOS, BILITOT, PROT, ALBUMIN in the last 168 hours. No results for input(s): LIPASE, AMYLASE in the last 168 hours. No results for input(s): AMMONIA in the last 168 hours. Coagulation Profile: No results for input(s): INR, PROTIME in the last 168 hours. Cardiac Enzymes: Recent Labs  Lab 10/09/18 0932  TROPONINI 0.03*   BNP (last 3 results) No results for input(s): PROBNP in the last 8760 hours. HbA1C: No results for input(s): HGBA1C in the last 72 hours. CBG: No results for input(s): GLUCAP in the last 168 hours. Lipid Profile: No results for input(s): CHOL, HDL, LDLCALC, TRIG, CHOLHDL, LDLDIRECT  in the last 72 hours. Thyroid Function Tests: No results for input(s): TSH, T4TOTAL, FREET4, T3FREE, THYROIDAB in the last 72 hours. Anemia Panel: No results for input(s): VITAMINB12, FOLATE, FERRITIN, TIBC, IRON, RETICCTPCT in the last 72 hours. Urine analysis:    Component Value Date/Time   COLORURINE BROWN (A) 02/18/2018 0324   APPEARANCEUR CLOUDY (A) 02/18/2018 0324   LABSPEC 1.021 02/18/2018 0324   PHURINE 6.0 02/18/2018 0324   GLUCOSEU NEGATIVE 02/18/2018 0324   HGBUR LARGE (A) 02/18/2018 0324   BILIRUBINUR NEGATIVE 02/18/2018 0324   KETONESUR NEGATIVE 02/18/2018 0324   PROTEINUR 100 (A) 02/18/2018 0324   UROBILINOGEN 1.0 11/22/2014 1132   NITRITE NEGATIVE 02/18/2018 0324   LEUKOCYTESUR NEGATIVE 02/18/2018 0324    Radiological Exams on Admission: Dg Chest Port 1 View  Result Date: 10/09/2018 CLINICAL DATA:  Shortness of breath and nonproductive cough for the past week. Decreased oxygen saturation. EXAM: PORTABLE CHEST 1 VIEW COMPARISON:  Chest x-ray of August 29, 2017 FINDINGS: The lungs are adequately inflated. There's hazy increased density in the lingula. There's no pleural effusion or pneumothorax. The heart is top-normal in size. The pulmonary vascularity is normal. The trachea is midline. The bony thorax exhibits no acute abnormality. IMPRESSION: Lingular atelectasis or pneumonia. Followup PA and lateral chest X-ray is recommended in 3-4 weeks following trial of antibiotic therapy to ensure resolution and exclude underlying malignancy. Electronically Signed   By: David  [place] M.D.   On: 10/09/2018 10:11    EKG: Independently reviewed.  EKG with normal sinus rhythm, normal axis normal intervals.  Assessment/Plan Active Problems:   Hypoxia  52 year old male with muscular dystrophy who has been not feeling well for the last 7 days with generalized weakness poor appetite, dizziness and dry cough.  When EMS assessed him today he was hypoxic down to 80% on room air.  He  continues to smoke cigarettes and does not have supplemental oxygen at home.  On his initial physical examination blood pressure 105/64, heart rate 94, respiratory rate 21, oxygen saturation 93%.  His lungs had diffuse rales bilaterally, no wheezing, no rhonchi, heart S1-S2 present, rhythmic tachycardic, no gallops rubs or murmurs, abdomen soft, no lower extremity edema, positive significant decreased muscle mass.  Sodium 146 potassium 3.8, chloride 103, bicarb 32, glucose 104, BUN 9, creatinine 0.51, troponin 0.03, white count 4.2, hemoglobin 14.5 hematocrit 46.9, platelet 109.  Chest radiograph with hyperinflation, faint atelectasis in the lingula, other infiltrates.  Patient will be admitted to the hospital working  diagnosis of acute hypoxic respiratory failure to rule out community-acquired pneumonia/atelectasis.   1.  Acute hypoxic respiratory failure.  Chest radiograph with a faint infiltrate at the lingula, likely not sufficient to explain degree of hypoxemia, will proceed with CT angiography to rule out pulmonary embolism, at the same time will have a better impression of lung parenchyma.  Continue oximetry monitoring and supplemental oxygen per nasal cannula.  Continue antibiotic therapy with levofloxacin, with intention to discontinue in 24 hours if patient has no further signs of infection.  Continue bronchodilator therapy with DuoNeb.  2.  Dyslipidemia.  Continue atorvastatin, and fenofibrate.  3.  GERD.  Continue famotidine.  4.  BPH.  Continue Flomax, no signs of urinary retention.  5.  Depression.  Continue mirtazapine.  6.  Muscular dystrophy.  Continue supportive medical therapy, including supportive oxygen and further work-up for venous thromboembolism.  Patient is wheelchair-bound.  DVT prophylaxis: enoxaparin  Code Status: full  Family Communication: I spoke with patient's sister at the bedside and all questions were addressed.   Disposition Plan: Telemetry   Consults called:  none  Admission status: Inpatient.     Hensley Treat Annett Gulaaniel Yun Gutierrez MD [facility] Pager [phone]  If 7PM-7AM, please contact night-coverage [email or web address] Password [email or web address]  10/09/2018, 12:14 PM

## 2018-10-09 NOTE — ED Notes (Signed)
Delay on vitals/EKG PT currently still on EMS bed

## 2018-10-09 NOTE — ED Notes (Signed)
Patient aware we need urine sample. Patient will call out when ready.  

## 2018-10-10 LAB — CBC
HCT: 42.5 % (ref 39.0–52.0)
Hemoglobin: 13.3 g/dL (ref 13.0–17.0)
MCH: 28.9 pg (ref 26.0–34.0)
MCHC: 31.3 g/dL (ref 30.0–36.0)
MCV: 92.2 fL (ref 80.0–100.0)
Platelets: 147 10*3/uL — ABNORMAL LOW (ref 150–400)
RBC: 4.61 MIL/uL (ref 4.22–5.81)
RDW: 15.2 % (ref 11.5–15.5)
WBC: 6.2 10*3/uL (ref 4.0–10.5)
nRBC: 0 % (ref 0.0–0.2)

## 2018-10-10 LAB — BASIC METABOLIC PANEL
Anion gap: 10 (ref 5–15)
BUN: 7 mg/dL (ref 6–20)
CO2: 29 mmol/L (ref 22–32)
Calcium: 8.3 mg/dL — ABNORMAL LOW (ref 8.9–10.3)
Chloride: 105 mmol/L (ref 98–111)
Creatinine, Ser: 0.48 mg/dL — ABNORMAL LOW (ref 0.61–1.24)
GFR calc Af Amer: >60 mL/min (ref >60)
GFR calc non Af Amer: >60 mL/min (ref >60)
Glucose, Bld: 113 mg/dL — ABNORMAL HIGH (ref 70–99)
Potassium: 3 mmol/L — ABNORMAL LOW (ref 3.5–5.1)
Sodium: 144 mmol/L (ref 135–145)

## 2018-10-10 LAB — URINALYSIS, ROUTINE W REFLEX MICROSCOPIC
Bilirubin Urine: NEGATIVE
Glucose, UA: NEGATIVE mg/dL
Hgb urine dipstick: NEGATIVE
Ketones, ur: 5 mg/dL — AB
Leukocytes, UA: NEGATIVE
Nitrite: NEGATIVE
Protein, ur: 30 mg/dL — AB
Specific Gravity, Urine: >1.046 — ABNORMAL HIGH (ref 1.005–1.030)
pH: 5 (ref 5.0–8.0)

## 2018-10-10 LAB — MAGNESIUM: Magnesium: 2.1 mg/dL (ref 1.7–2.4)

## 2018-10-10 MED ORDER — POTASSIUM CHLORIDE CRYS ER 20 MEQ PO TBCR
40.0000 meq | EXTENDED_RELEASE_TABLET | ORAL | Status: AC
  Administered 2018-10-10: 40 meq via ORAL
  Filled 2018-10-10: qty 2

## 2018-10-10 MED ORDER — IPRATROPIUM-ALBUTEROL 0.5-2.5 (3) MG/3ML IN SOLN
3.0000 mL | Freq: Three times a day (TID) | RESPIRATORY_TRACT | Status: DC
  Administered 2018-10-10 – 2018-10-12 (×7): 3 mL via RESPIRATORY_TRACT
  Filled 2018-10-10 (×10): qty 3

## 2018-10-10 MED ORDER — NYSTATIN 100000 UNIT/ML MT SUSP
5.0000 mL | Freq: Four times a day (QID) | OROMUCOSAL | Status: DC
  Administered 2018-10-10 – 2018-10-12 (×6): 500000 [IU] via ORAL
  Filled 2018-10-10 (×9): qty 5

## 2018-10-10 MED ORDER — POTASSIUM CHLORIDE 10 MEQ/100ML IV SOLN
10.0000 meq | INTRAVENOUS | Status: AC
  Administered 2018-10-10 – 2018-10-11 (×3): 10 meq via INTRAVENOUS
  Filled 2018-10-10 (×3): qty 100

## 2018-10-10 MED ORDER — SODIUM CHLORIDE 0.9 % IV SOLN
1.0000 g | INTRAVENOUS | Status: DC
  Administered 2018-10-10 – 2018-10-11 (×2): 1 g via INTRAVENOUS
  Filled 2018-10-10 (×2): qty 1

## 2018-10-10 MED ORDER — AZITHROMYCIN 250 MG PO TABS
500.0000 mg | ORAL_TABLET | ORAL | Status: DC
  Administered 2018-10-11 – 2018-10-12 (×2): 500 mg via ORAL
  Filled 2018-10-10 (×2): qty 2

## 2018-10-10 NOTE — Progress Notes (Signed)
PROGRESS NOTE    Malik WAMPOLE  ZOX:[id number] DOB: 02-08-1966 DOA: 10/09/2018 PCP: Lovena Neighbours, MD   Brief Narrative:  Malik [name] is Malik [name] 52 y.o. male with medical history significant of myotonic muscular dystrophy, hypertension, nephrolithiasis, history of deep vein thrombosis left brachial vein.  Patient presents with worsening symptoms for the last 7 days, consistent with generalized weakness, poor appetite, dizziness and dry cough.  There seems to be no improving factors, worse with movement, denies any associated fevers or chills.  His symptoms have been moderate to severe intensity, predominantly generalized weakness.  He continues to smoke cigarettes but has not do so over the last 7 days.  He is wheelchair-bound for the last 6 years due to his muscular dystrophy.  When EMS was called he was found hypoxic in the 80s, he was brought to the hospital for further evaluation.  Assessment & Plan:   Active Problems:   Hypoxia  1.  Acute hypoxic respiratory failure 2/2 CAP - CT chest with bilateral lower lobe pneumonia vs atelectasis - Currently treating with ceftriaxone/azithromycin for CAP coverage (s/p 1 dose levaquin on 12/9) - No blood cx obtained, follow sputum cx - Follow urine strep - ? Aspiration with mild fluid distended esophagus, speech recommending MBS and esophagram, see below  # Dysphagia: seen by speech, recommending MBS and esophagram.  Speech to follow up on 12/11 to see if pt agreeable to instrumental testing.  Per speech, consider palliative c/s (will hold off for now, consider tomorrow after discussing with pt).  # Thrush: nystatin  2.  Dyslipidemia.  Continue atorvastatin, and fenofibrate.  3.  GERD.  Continue famotidine.  4.  BPH.  Continue Flomax, no signs of urinary retention.  5.  Depression.  Continue mirtazapine.  6.  Muscular dystrophy.  Continue supportive medical therapy.  Patient is wheelchair-bound.  # Hypokalemia: replace and  follow mag  DVT prophylaxis: lovenox Code Status:DNR Family Communication: none at bedside Disposition Plan: pending improvement in respiratory status, work up of dysphagia   Consultants:   none  Procedures:   none  Antimicrobials:  Anti-infectives (From admission, onward)   Start     Dose/Rate Route Frequency Ordered Stop   10/10/18 1200  cefTRIAXone (ROCEPHIN) 1 g in sodium chloride 0.9 % 100 mL IVPB     1 g 200 mL/hr over 30 Minutes Intravenous Every 24 hours 10/10/18 0739 10/15/18 1159   10/10/18 1200  azithromycin (ZITHROMAX) tablet 500 mg     500 mg Oral Every 24 hours 10/10/18 0739 10/15/18 1159   10/09/18 1200  levofloxacin (LEVAQUIN) IVPB 750 mg  Status:  Discontinued     750 mg 100 mL/hr over 90 Minutes Intravenous Every 24 hours 10/09/18 1148 10/10/18 0739     Subjective: Feels tired. Some SOB. Doesn't say much.  Objective: Vitals:   10/09/18 1851 10/09/18 2047 10/10/18 0609 10/10/18 1300  BP:  (!) 90/58 130/75 126/71  Pulse:  82 75 70  Resp:  18 18 18   Temp:  98 F (36.7 C) 98.8 F (37.1 C) 98.7 F (37.1 C)  TempSrc:   Oral Oral  SpO2:  90% 92% 94%  Weight: 58.1 kg     Height: 5\' 7"  (1.702 m)       Intake/Output Summary (Last 24 hours) at 10/10/2018 1724 Last data filed at 10/10/2018 0900 Gross per 24 hour  Intake 810.13 ml  Output 550 ml  Net 260.13 ml   Filed Weights   10/09/18 1851  Weight: 58.1  kg    Examination:  General exam: Appears calm and sleepy HEENT: thrush Respiratory system: Clear to auscultation. Respiratory effort normal. Cardiovascular system: S1 & S2 heard, RRR. Gastrointestinal system: Abdomen is nondistended, soft and nontender.  Central nervous system: Alert and oriented, but Malik [name] bit sleepy, moving all extremities Extremities: no LEE Skin: No rashes, lesions or ulcers Psychiatry: Judgement and insight appear normal. Mood & affect appropriate.     Data Reviewed: I have personally reviewed following labs and  imaging studies  CBC: Recent Labs  Lab 10/09/18 1055 10/10/18 0532  WBC 4.2 6.2  NEUTROABS 2.3  --   HGB 14.5 13.3  HCT 46.9 42.5  MCV 93.2 92.2  PLT 109* 147*   Basic Metabolic Panel: Recent Labs  Lab 10/09/18 0932 10/10/18 0532  NA 146* 144  K 3.8 3.0*  CL 103 105  CO2 32 29  GLUCOSE 104* 113*  BUN 9 7  CREATININE 0.51* 0.48*  CALCIUM 9.2 8.3*  MG  --  2.1   GFR: Estimated Creatinine Clearance: 86.7 mL/min (Malik [name]) (by C-G formula based on SCr of 0.48 mg/dL (L)). Liver Function Tests: No results for input(s): AST, ALT, ALKPHOS, BILITOT, PROT, ALBUMIN in the last 168 hours. No results for input(s): LIPASE, AMYLASE in the last 168 hours. No results for input(s): AMMONIA in the last 168 hours. Coagulation Profile: No results for input(s): INR, PROTIME in the last 168 hours. Cardiac Enzymes: Recent Labs  Lab 10/09/18 0932  TROPONINI 0.03*   BNP (last 3 results) No results for input(s): PROBNP in the last 8760 hours. HbA1C: No results for input(s): HGBA1C in the last 72 hours. CBG: No results for input(s): GLUCAP in the last 168 hours. Lipid Profile: No results for input(s): CHOL, HDL, LDLCALC, TRIG, CHOLHDL, LDLDIRECT in the last 72 hours. Thyroid Function Tests: No results for input(s): TSH, T4TOTAL, FREET4, T3FREE, THYROIDAB in the last 72 hours. Anemia Panel: No results for input(s): VITAMINB12, FOLATE, FERRITIN, TIBC, IRON, RETICCTPCT in the last 72 hours. Sepsis Labs: Recent Labs  Lab 10/09/18 0932  LATICACIDVEN 3.2*  1.3    No results found for this or any previous visit (from the past 240 hour(s)).       Radiology Studies: Ct Angio Chest Pe W Or Wo Contrast  Result Date: 10/09/2018 CLINICAL DATA:  Generalized weakness, dyspnea and hypoxemia. History of deep vein thrombosis LEFT arm, muscular dystrophy. EXAM: CT ANGIOGRAPHY CHEST WITH CONTRAST TECHNIQUE: Multidetector CT imaging of the chest was performed using the standard protocol during bolus  administration of intravenous contrast. Multiplanar CT image reconstructions and MIPs were obtained to evaluate the vascular anatomy. CONTRAST:  100mL ISOVUE-370 IOPAMIDOL (ISOVUE-370) INJECTION 76% COMPARISON:  Chest radiograph October 09, 2018 FINDINGS: CARDIOVASCULAR: Adequate contrast opacification of the pulmonary artery's. Main pulmonary artery is not enlarged. No pulmonary arterial filling defects to the level of the subsegmental branches. Heart size is normal, no right heart strain. No pericardial effusion. Patent RIGHT bypass graft coursing through RIGHT chest, origin incompletely evaluated. MEDIASTINUM/NODES: No lymphadenopathy by CT size criteria. Mildly patulous fluid-filled esophagus, aspiration risk. LUNGS/PLEURA: Tracheobronchial tree is patent, no pneumothorax. LEFT greater than RIGHT lower lobe hypoenhancing consolidation with air bronchograms. Mild bronchial wall thickening. RIGHT middle lobe atelectasis. No pleural effusion. Faint RIGHT upper lobe tree-in-bud infiltrates. Lingular atelectasis. UPPER ABDOMEN: Non-acute.  Status post cholecystectomy. MUSCULOSKELETAL: Non-acute. Extensive muscle atrophy compatible with history of muscular dystrophy. Small posterior upper thoracic sebaceous cyst. Mild thoracic spondylosis. Dense T11-12 disc. Review of the MIP images confirms  the above findings. IMPRESSION: 1. No acute pulmonary embolism. 2. Bilateral lower lobe pneumonia versus atelectasis. Minimal tree-in-bud infiltrates may be infectious or inflammatory. 3. Mild fluid distended esophagus, aspiration risk. Electronically Signed   By: Awilda Metro M.D.   On: 10/09/2018 20:25   Dg Chest Port 1 View  Result Date: 10/09/2018 CLINICAL DATA:  Shortness of breath and nonproductive cough for the past week. Decreased oxygen saturation. EXAM: PORTABLE CHEST 1 VIEW COMPARISON:  Chest x-ray of August 29, 2017 FINDINGS: The lungs are adequately inflated. There's hazy increased density in the lingula.  There's no pleural effusion or pneumothorax. The heart is top-normal in size. The pulmonary vascularity is normal. The trachea is midline. The bony thorax exhibits no acute abnormality. IMPRESSION: Lingular atelectasis or pneumonia. Followup PA and lateral chest X-ray is recommended in 3-4 weeks following trial of antibiotic therapy to ensure resolution and exclude underlying malignancy. Electronically Signed   By: David  [place] M.D.   On: 10/09/2018 10:11        Scheduled Meds: . aspirin EC  81 mg Oral Daily  . atorvastatin  40 mg Oral QPM  . azithromycin  500 mg Oral Q24H  . benzonatate  100 mg Oral TID  . enoxaparin (LOVENOX) injection  40 mg Subcutaneous Q24H  . famotidine  20 mg Oral BID  . fenofibrate  54 mg Oral Daily  . guaiFENesin  600 mg Oral Q12H  . ipratropium-albuterol  3 mL Nebulization TID  . magnesium oxide  400 mg Oral Daily  . mirtazapine  15 mg Oral QHS  . multivitamin with minerals  1 tablet Oral Daily  . nystatin  5 mL Oral QID  . tamsulosin  0.4 mg Oral QPC breakfast  . vitamin B-12  1,000 mcg Oral Daily   Continuous Infusions: . cefTRIAXone (ROCEPHIN)  IV 1 g (10/10/18 1337)  . dextrose 5 % and 0.9% NaCl 75 mL/hr at 10/10/18 0200     LOS: 1 day    Time spent: over 30 min    Lacretia Nicks, MD [facility] Pager [phone]  If 7PM-7AM, please contact night-coverage [email or web address] Password [facility] 10/10/2018, 5:24 PM

## 2018-10-10 NOTE — Progress Notes (Signed)
Pt still has not voided, pt aware the need to provide urine sample pt cannot or willnot attempt to urinate. Pt refusing bladder scan, pt agitated and "just want to sleep" stating "i'm tired" does not want any interventions at this time and per pt "can wait till morning"

## 2018-10-10 NOTE — Evaluation (Signed)
Clinical/Bedside Swallow Evaluation Patient Details  Name: Malik [name] MRN: [id number] Date of Birth: September 20, 1966  Today's Date: 10/10/2018 Time: SLP Start Time (ACUTE ONLY): 1239 SLP Stop Time (ACUTE ONLY): 1300 SLP Time Calculation (min) (ACUTE ONLY): 21 min  Past Medical History:  Past Medical History:  Diagnosis Date  . DVT (deep venous thrombosis) (HCC)    left brachial vein DVT 01/29/16  . Dysphagia   . Hiccups   . High blood pressure   . History of kidney stones   . Hypertension   . Myotonic muscular dystrophy [facility](HCC)    Past Surgical History:  Past Surgical History:  Procedure Laterality Date  . AXILLARY-FEMORAL BYPASS GRAFT Right 03/07/2017   Procedure: RIGHT AXILLA-BIFEMORAL BYPASS GRAFT USING HEMASHIELD GOLD VASCULAR GRAFT;  Surgeon: Sherren KernsFields, Charles E, MD;  [facility];  Service: Vascular;  Laterality: Right;  . CHOLECYSTECTOMY    . ENDARTERECTOMY FEMORAL Left 03/07/2017   Procedure: ENDARTERECTOMY LEFT FEMORAL ARTERY ;  Surgeon: Sherren KernsFields, Charles E, MD;  [facility];  Service: Vascular;  Laterality: Left;  . ESOPHAGOGASTRODUODENOSCOPY N/A 05/10/2016   Procedure: ESOPHAGOGASTRODUODENOSCOPY (EGD);  Surgeon: Sherrilyn RistHenry L Danis III, MD;  [facility];  Service: Endoscopy;  Laterality: N/A;  Please discuss sedation possibilities with Dr. Myrtie Neitheranis  . ESOPHAGOGASTRODUODENOSCOPY N/A 08/29/2017   Procedure: ESOPHAGOGASTRODUODENOSCOPY (EGD);  Surgeon: Charna ElizabethMann, Jyothi, MD;  [facility];  Service: Endoscopy;  Laterality: N/A;  . MULTIPLE TOOTH EXTRACTIONS  2007   Pt had all teeth pulled, has not yet gotten dentures.   Marland Kitchen. PATCH ANGIOPLASTY Left 03/07/2017   Procedure: PATCH ANGIOPLASTY LEFT FEMORAL ARTERY USING XENOSURE BIOLOGIC PATCH;  Surgeon: Sherren KernsFields, Charles E, MD;  [facility];  Service: Vascular;  Laterality: Left;   HPI:  52 yo Pt has h/o muscular dystrophy and admitted with severe hypoxemia.  Pt also has h/o GERD, dysphagia.  His imaging studies showed minimal  infectious vs inflammatory infiltrate in bud. He has required EGD to remove lodged food from distal esophagus twice in two years.  SLP saw this pt in 2014 for swallow evaluation with recommendation to stay on regular/ground meats/thin liquid diet as he was managing his dysphagia well.  Note per imaging study, pt also with mild fluid disteneded esophagus concerning for aspiraiton risk.  Pt today states "I'm here to get better from my pna - not about swallowing".  Educated him to concern that aspiration/dysphagia may contribute to weight loss and pulmonary infections.     Assessment / Plan / Recommendation Clinical Impression  Limited assessment completed due to pt's limited participation/willingness to accept po intake.  CN exam unremarkable except generalized weakness.  Pt presents with baseline weak cough but is observed to cough after every swallow - much more prominent with straw boluses.  Cough is weak and not adequately protective.  Changing bolus size to tsp amounts were tolerated better.  Suspect pt with multifactorial dysphagia including oropharyngeal and esophageal deficits due to his Muscular Dystrophy.  Will change to clear liquids as pt states he tolerates these better at this time.  Also advised pt to consider MBS followed by esophagram if able to participate.  Advised to concerns for aspiration and malnutrition risk with current level of difficulties.  Pt states "I don't have a choice".  Advised that tests were recommended  - but he as a pt *with his HCPOA* has right to declined.  Pt is agreeable to diet changed to clears currently.  Will follow up next date to proceed with instrumental testing  if pt agreeable.  Would recommend a palliative consult given progressive neurological disease and concern for poor functional reserve.  Informed RN and NT of recommendations. Pt states he does not tolerate milk based products because it gives im diarrhea. He did not care for [facility] and declined to  consume more than one straw bolus.   SLP Visit Diagnosis: Dysphagia, oropharyngeal phase (R13.12);Dysphagia, pharyngoesophageal phase (R13.14)    Aspiration Risk  Moderate aspiration risk;Severe aspiration risk;Risk for inadequate nutrition/hydration    Diet Recommendation (clears via tsp only)   Liquid Administration via: Spoon Medication Administration: Crushed with puree Supervision: Full supervision/cueing for compensatory strategies Compensations: Slow rate;Small sips/bites Postural Changes: Seated upright at 90 degrees;Remain upright for at least 30 minutes after po intake    Other  Recommendations Recommended Consults: Consider esophageal assessment(and MBS) Oral Care Recommendations: Oral care BID   Follow up Recommendations (tbd)      Frequency and Duration    tbd         Prognosis Barriers to Reach Goals: Time post onset;Severity of deficits;Motivation      Swallow Study   General Date of Onset: 10/10/18 HPI: 52 yo Pt has h/o muscular dystrophy and admitted with severe hypoxemia.  Pt also has h/o GERD, dysphagia.  His imaging studies showed minimal infectious vs inflammatory infiltrate in bud. He has required EGD to remove lodged food from distal esophagus twice in two years.  SLP saw this pt in 2014 for swallow evaluation with recommendation to stay on regular/ground meats/thin liquid diet as he was managing his dysphagia well.  Note per imaging study, pt also with mild fluid disteneded esophagus concerning for aspiraiton risk.  Pt today states "I'm here to get better from my pna - not about swallowing".  Educated him to concern that aspiration/dysphagia may contribute to weight loss and pulmonary infections.   Type of Study: Bedside Swallow Evaluation Previous Swallow Assessment: EGDs x2 with food impaction Diet Prior to this Study: Dysphagia 3 (soft);Thin liquids Temperature Spikes Noted: No Respiratory Status: Nasal cannula History of Recent Intubation:  No Behavior/Cognition: Alert;Other (Comment);Agitated Oral Cavity Assessment: Other (comment)(? oral thrush on tongue midline, pt denies discomfort) Oral Care Completed by SLP: Recent completion by staff Oral Cavity - Dentition: Edentulous(dentures not present and pt normally uses them to eat) Vision: Functional for self-feeding Self-Feeding Abilities: Total assist Patient Positioning: Upright in bed Baseline Vocal Quality: Low vocal intensity Volitional Cough: Weak    Oral/Motor/Sensory Function Overall Oral Motor/Sensory Function: Generalized oral weakness   Ice Chips Ice chips: Not tested   Thin Liquid Thin Liquid: Impaired Presentation: Straw;Spoon Pharyngeal  Phase Impairments: Cough - Immediate;Cough - Delayed    Nectar Thick Nectar Thick Liquid: Impaired Presentation: Spoon Pharyngeal Phase Impairments: Cough - Delayed   Honey Thick Honey Thick Liquid: Not tested   Puree Puree: Within functional limits(jello)   Solid     Solid: Not tested Other Comments: pt declined      [name] 10/10/2018,1:07 PM  Donavan Burnet, MS [facility] SLP Acute Rehab Services Pager [phone] Office [phone]

## 2018-10-10 NOTE — Progress Notes (Signed)
Thick oral thrush noted. Patient unable to fully swallow most of what he takes by mouth. He spits up what he has attempted to swallow within minutes. Denies nausea. Patient's family reports swallowing difficulty not a new problem for him.  Marisa SeverinKegley, Lanice Folden Hooven, RN

## 2018-10-11 ENCOUNTER — Inpatient Hospital Stay (HOSPITAL_COMMUNITY): Payer: Medicaid Other

## 2018-10-11 DIAGNOSIS — R1310 Dysphagia, unspecified: Secondary | ICD-10-CM

## 2018-10-11 LAB — CBC
HCT: 40.8 % (ref 39.0–52.0)
Hemoglobin: 12.7 g/dL — ABNORMAL LOW (ref 13.0–17.0)
MCH: 28.9 pg (ref 26.0–34.0)
MCHC: 31.1 g/dL (ref 30.0–36.0)
MCV: 92.7 fL (ref 80.0–100.0)
Platelets: 170 10*3/uL (ref 150–400)
RBC: 4.4 MIL/uL (ref 4.22–5.81)
RDW: 15.4 % (ref 11.5–15.5)
WBC: 7.1 10*3/uL (ref 4.0–10.5)
nRBC: 0 % (ref 0.0–0.2)

## 2018-10-11 LAB — BASIC METABOLIC PANEL
Anion gap: 3 — ABNORMAL LOW (ref 5–15)
BUN: 6 mg/dL (ref 6–20)
CO2: 30 mmol/L (ref 22–32)
Calcium: 8.5 mg/dL — ABNORMAL LOW (ref 8.9–10.3)
Chloride: 113 mmol/L — ABNORMAL HIGH (ref 98–111)
Creatinine, Ser: 0.49 mg/dL — ABNORMAL LOW (ref 0.61–1.24)
GFR calc Af Amer: >60 mL/min (ref >60)
GFR calc non Af Amer: >60 mL/min (ref >60)
Glucose, Bld: 109 mg/dL — ABNORMAL HIGH (ref 70–99)
Potassium: 4.3 mmol/L (ref 3.5–5.1)
Sodium: 146 mmol/L — ABNORMAL HIGH (ref 135–145)

## 2018-10-11 LAB — MAGNESIUM: Magnesium: 1.8 mg/dL (ref 1.7–2.4)

## 2018-10-11 MED ORDER — AMOXICILLIN-POT CLAVULANATE 875-125 MG PO TABS
1.0000 | ORAL_TABLET | Freq: Two times a day (BID) | ORAL | Status: DC
  Administered 2018-10-12 (×2): 1 via ORAL
  Filled 2018-10-11 (×3): qty 1

## 2018-10-11 MED ORDER — GUAIFENESIN-DM 100-10 MG/5ML PO SYRP
5.0000 mL | ORAL_SOLUTION | ORAL | Status: DC | PRN
  Administered 2018-10-11: 5 mL via ORAL
  Filled 2018-10-11: qty 10

## 2018-10-11 NOTE — Progress Notes (Signed)
PROGRESS NOTE    Malik [name]  ZOX:[id number] DOB: [birth year] DOA: 10/09/2018 PCP: Lovena Neighbours, MD   Brief Narrative:  Malik [name] is a 52 y.o. male with medical history significant of myotonic muscular dystrophy, hypertension, nephrolithiasis, history of deep vein thrombosis left brachial vein.  Patient presented with generalized weakness, poor appetite, dizziness and cough.  Concern was for pneumonia.  He was hospitalized for further management.  Patient was also noted to be hypoxic in the emergency department.  Assessment & Plan:   Acute hypoxic respiratory failure 2/2 CAP CT chest with bilateral lower lobe pneumonia vs atelectasis.  Patient was given a dose of Levaquin on 12/9.  Started on ceftriaxone and azithromycin..  WBC has been normal.  Unfortunately no cultures were sent.  There was also concern for aspiration and fluid distended esophagus was noted on CT scan.  Speech therapy was consulted.  Esophagogram does not show any obvious abnormality in the esophagus.  Consider changing to Augmentin.  Dysphagia, likely oropharyngeal Speech therapy is following.  No abnormalities noted on esophagram.  Dysphagia 3 diet recommended by speech therapy.  Palliative medicine consult could be considered at his assisted living facility.  Oral thrush Continue nystatin  Dyslipidemia Continue atorvastatin, and fenofibrate.  GERD Continue famotidine.  BPH Continue Flomax.  Continue to monitor for urinary retention.  Depression Continue mirtazapine.  History of muscular dystrophy Continue supportive medical therapy.  Patient is wheelchair-bound.   Hypokalemia Potassium was repleted and is normal today.  Magnesium 1.8.    DVT prophylaxis: lovenox Code Status: DNR Family Communication: No family at bedside. Disposition Plan: Change to oral antibiotics today.  Anticipate discharge back to his assisted living facility tomorrow.  Palliative medicine consult at assisted  living facility.   Consultants:   none  Procedures:   none  Antimicrobials:  Anti-infectives (From admission, onward)   Start     Dose/Rate Route Frequency Ordered Stop   10/10/18 1200  cefTRIAXone (ROCEPHIN) 1 g in sodium chloride 0.9 % 100 mL IVPB     1 g 200 mL/hr over 30 Minutes Intravenous Every 24 hours 10/10/18 0739 10/15/18 1159   10/10/18 1200  azithromycin (ZITHROMAX) tablet 500 mg     500 mg Oral Every 24 hours 10/10/18 0739 10/15/18 1159   10/09/18 1200  levofloxacin (LEVAQUIN) IVPB 750 mg  Status:  Discontinued     750 mg 100 mL/hr over 90 Minutes Intravenous Every 24 hours 10/09/18 1148 10/10/18 0739     Subjective: Denies any complaints.  States that he still has a cough.  Objective: Vitals:   10/10/18 2147 10/11/18 0611 10/11/18 0714 10/11/18 1329  BP: 116/75 111/75  106/75  Pulse: 71 68  70  Resp: 18 18  18   Temp: 98 F (36.7 C) 98.7 F (37.1 C)  98.4 F (36.9 C)  TempSrc: Oral   Oral  SpO2: 96% 95% 90% 94%  Weight:      Height:        Intake/Output Summary (Last 24 hours) at 10/11/2018 1351 Last data filed at 10/11/2018 1327 Gross per 24 hour  Intake 1841.99 ml  Output 640 ml  Net 1201.99 ml   Filed Weights   10/09/18 1851  Weight: 58.1 kg    Examination:  General exam: Awake alert.  In no distress HEENT: thrush Respiratory system: Diminished air entry at the bases.  Normal effort at rest.  No wheezing or rhonchi.  Few crackles at the bases. Cardiovascular system: S1-S2 is normal regular.  No S3-S4.  No rubs murmurs or bruit Gastrointestinal system: Abdomen is soft.  Nontender nondistended Central nervous system: Alert and oriented x3.  No obvious focal neurological deficits.  Diminished strength in the lower extremity from his muscular dystrophy.    Data Reviewed: I have personally reviewed following labs and imaging studies  CBC: Recent Labs  Lab 10/09/18 1055 10/10/18 0532 10/11/18 0531  WBC 4.2 6.2 7.1  NEUTROABS 2.3  --    --   HGB 14.5 13.3 12.7*  HCT 46.9 42.5 40.8  MCV 93.2 92.2 92.7  PLT 109* 147* 170   Basic Metabolic Panel: Recent Labs  Lab 10/09/18 0932 10/10/18 0532 10/11/18 0531  NA 146* 144 146*  K 3.8 3.0* 4.3  CL 103 105 113*  CO2 32 29 30  GLUCOSE 104* 113* 109*  BUN 9 7 6   CREATININE 0.51* 0.48* 0.49*  CALCIUM 9.2 8.3* 8.5*  MG  --  2.1 1.8   GFR: Estimated Creatinine Clearance: 86.7 mL/min (A) (by C-G formula based on SCr of 0.49 mg/dL (L)).  Cardiac Enzymes: Recent Labs  Lab 10/09/18 0932  TROPONINI 0.03*   Sepsis Labs: Recent Labs  Lab 10/09/18 0932  LATICACIDVEN 3.2*  1.3    Radiology Studies: Ct Angio Chest Pe W Or Wo Contrast  Result Date: 10/09/2018 CLINICAL DATA:  Generalized weakness, dyspnea and hypoxemia. History of deep vein thrombosis LEFT arm, muscular dystrophy. EXAM: CT ANGIOGRAPHY CHEST WITH CONTRAST TECHNIQUE: Multidetector CT imaging of the chest was performed using the standard protocol during bolus administration of intravenous contrast. Multiplanar CT image reconstructions and MIPs were obtained to evaluate the vascular anatomy. CONTRAST:  ISOVUE-370 IOPAMIDOL (ISOVUE-370) INJECTION 76% COMPARISON:  Chest radiograph October 09, 2018 FINDINGS: CARDIOVASCULAR: Adequate contrast opacification of the pulmonary artery's. Main pulmonary artery is not enlarged. No pulmonary arterial filling defects to the level of the subsegmental branches. Heart size is normal, no right heart strain. No pericardial effusion. Patent RIGHT bypass graft coursing through RIGHT chest, origin incompletely evaluated. MEDIASTINUM/NODES: No lymphadenopathy by CT size criteria. Mildly patulous fluid-filled esophagus, aspiration risk. LUNGS/PLEURA: Tracheobronchial tree is patent, no pneumothorax. LEFT greater than RIGHT lower lobe hypoenhancing consolidation with air bronchograms. Mild bronchial wall thickening. RIGHT middle lobe atelectasis. No pleural effusion. Faint RIGHT upper  lobe tree-in-bud infiltrates. Lingular atelectasis. UPPER ABDOMEN: Non-acute.  Status post cholecystectomy. MUSCULOSKELETAL: Non-acute. Extensive muscle atrophy compatible with history of muscular dystrophy. Small posterior upper thoracic sebaceous cyst. Mild thoracic spondylosis. Dense T11-12 disc. Review of the MIP images confirms the above findings. IMPRESSION: 1. No acute pulmonary embolism. 2. Bilateral lower lobe pneumonia versus atelectasis. Minimal tree-in-bud infiltrates may be infectious or inflammatory. 3. Mild fluid distended esophagus, aspiration risk. Electronically Signed   By: Awilda Metro M.D.   On: 10/09/2018 20:25   Dg Esophagus  Result Date: 10/11/2018 CLINICAL DATA:  Dysphagia with chronic cough.  Muscular dystrophy. EXAM: ESOPHOGRAM/BARIUM SWALLOW TECHNIQUE: Single contrast examination was performed using  thin barium. FLUOROSCOPY TIME:  Fluoroscopy Time: Study was performed immediately following the modified speech swallow. Separate fluoro times were not recorded, but approximately 30 seconds of low-dose pulsed fluoro was utilized for this portion of the study. Radiation Exposure Index (if provided by the fluoroscopic device): Not accurately recorded. Number of Acquired Spot Images: 0 COMPARISON:  Modified examination performed preceding. Chest CT 10/09/2018. FINDINGS: Limited study was performed, evaluating the thoracic esophagus in the lateral projection with the patient seated in the modified speech chair. Repeat imaging of the cervical esophagus was  not performed. The esophageal motility is normal. There is no stricture, mass or ulceration. Barium flows freely through the gastroesophageal junction. A barium tablet was not administered. IMPRESSION: Normal limited examination of the thoracic esophagus. Electronically Signed   By: Carey BullocksWilliam  Veazey M.D.   On: 10/11/2018 09:45     Scheduled Meds: . aspirin EC  81 mg Oral Daily  . atorvastatin  40 mg Oral QPM  . azithromycin  500  mg Oral Q24H  . benzonatate  100 mg Oral TID  . enoxaparin (LOVENOX) injection  40 mg Subcutaneous Q24H  . famotidine  20 mg Oral BID  . fenofibrate  54 mg Oral Daily  . guaiFENesin  600 mg Oral Q12H  . ipratropium-albuterol  3 mL Nebulization TID  . magnesium oxide  400 mg Oral Daily  . mirtazapine  15 mg Oral QHS  . multivitamin with minerals  1 tablet Oral Daily  . nystatin  5 mL Oral QID  . tamsulosin  0.4 mg Oral QPC breakfast  . vitamin B-12  1,000 mcg Oral Daily   Continuous Infusions: . cefTRIAXone (ROCEPHIN)  IV 1 g (10/11/18 1350)  . dextrose 5 % and 0.9% NaCl 75 mL/hr at 10/11/18 0600     LOS: 2 days      Osvaldo ShipperGokul Celestia Duva, MD [facility] Pager [phone]  If 7PM-7AM, please contact night-coverage [email or web address] Password [email or web address] 10/11/2018, 1:51 PM

## 2018-10-11 NOTE — Progress Notes (Signed)
Pt voided 150cc urine and PVR per bladder scanner revealed 375cc.  MD notified, will continue to monitor.
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
## 2018-10-11 NOTE — Progress Notes (Signed)
Modified Barium Swallow Progress Note  Patient Details  Name: Malik [name] MRN: [id number] Date of Birth: 1966/06/11  Today's Date: 10/11/2018  Modified Barium Swallow completed.  Full report located under Chart Review in the Imaging Section.  Brief recommendations include the following:  Clinical Impression  Pt presents with mild oral and moderately severe pharyngo-esophageal dysphagia.   Sensorimotor deficits present resulting in gross pharyngeal residuals (mixed with secretions) that pt does not fully sense and has difficulty clearing.  He did propel residuals from vallecular region into oral cavity with reswallowing attempts x2 and expectoration x 2 with cue.  When pt expectorated thin = it was filled with viscous secretions.  Hopeful said secretions are contributing to his dysphagia and thus will be mitigated with medical improvement.  Mild tongue base and gross pharyngeal residuals were decreased with liquid swallows.  Pt taxed to sequentially swallow thin barium to tax system and pt did not aspirate or penetrate.  SlP is concerned that pt's current level of dysphagia will impact respiratory system and nutrition.  However recommend dys3/thin diet regarding oropharyngeal swallow with strict precautions.   Using live video, pt educated to findings/compensation strategies.  Will follow up for pt education re: oropharyngeal swallow ability and reinforcement of helpful compensation strategies.  Pt was coughing during MBS without barium visualized in larynx/trachea.  Please see notes from radiologist regarding modified esophagram.     Swallow Evaluation Recommendations       SLP Diet Recommendations: Dysphagia 3 (Mech soft) solids;Thin liquid   Liquid Administration via: Straw   Medication Administration: Crushed with puree   Supervision: Full supervision/cueing for compensatory strategies   Compensations: Slow rate;Small sips/bites;Follow solids with liquid(follow every solid with  several sips of liquids, dry swallow after each sip)   Postural Changes: Remain semi-upright after after feeds/meals (Comment);Seated upright at 90 degrees   Oral Care Recommendations: Oral care BID       Donavan Burnetamara Omere Marti, MS [facility] SLP Acute Rehab Services Pager [phone] Office [phone]  Chales AbrahamsKimball, Abbigal Radich Ann 10/11/2018,9:27 AM

## 2018-10-11 NOTE — Progress Notes (Signed)
Pt c/o of IV site burning r/t potassium infusion, rate changed to 50 for now.

## 2018-10-11 NOTE — Progress Notes (Addendum)
Pt output 200 ml on this shift, pt states does not have void, bladder distended, pt did not want scanned, pt gave consent at his time and then pt refused.

## 2018-10-12 MED ORDER — AZITHROMYCIN 500 MG PO TABS: 500.0000 mg | ORAL_TABLET | Freq: Every day | ORAL | 0 refills | Status: AC

## 2018-10-12 MED ORDER — NYSTATIN 100000 UNIT/ML MT SUSP: 5.0000 mL | Freq: Four times a day (QID) | OROMUCOSAL | 0 refills | Status: AC

## 2018-10-12 MED ORDER — AMOXICILLIN-POT CLAVULANATE 875-125 MG PO TABS: 1.0000 | ORAL_TABLET | Freq: Two times a day (BID) | ORAL | 0 refills | Status: AC

## 2018-10-12 MED ORDER — HYDROCODONE-ACETAMINOPHEN 5-325 MG PO TABS: 1.0000 | ORAL_TABLET | ORAL | 0 refills | Status: DC | PRN

## 2018-10-12 NOTE — Progress Notes (Signed)
Clinical Social Worker met with patient and family at bedside. Patient had sister  Neoma Laming) and aunt at bedside speaking to [place]. Neoma Laming stated she would like patient to go to a rehab facility for long term care. CSW stated to Neoma Laming unfortunately the hospital does not place for [place]. CSW gave family information on how to start the process of finding a facility they would like patient to go to for Long term care. Family stated they do not feel that patient is receiving the adequate care he needs at [facility]. CSW encouraged family to find a SNF they like and to start the process of long term placement. Family stated they are agreeable for patient to go back to [facility] but stated they will try there best to get him out of the facility. Family stated they would also like to make complaints about the [facility] have handled patients care. CSW stated to family that they will need to call the state to make any complaints they have about the ALF patient is currently in.   Rhea Pink, MSW,  [place]

## 2018-10-12 NOTE — Discharge Summary (Signed)
[facility]  Physician Discharge Summary   Patient ID: Malik [name] MRN: [id number] DOB/AGE: [birth year] 52 y.o.  Admit date: 10/09/2018 Discharge date: 10/12/2018  PCP: Lovena Neighbours, MD  DISCHARGE DIAGNOSES:  Community-acquired versus aspiration pneumonia Acute hypoxic respiratory failure, improved Oropharyngeal dysphagia Oral thrush Dyslipidemia GERD BPH History of muscular dystrophy and wheelchair-bound as a result  RECOMMENDATIONS FOR OUTPATIENT FOLLOW UP: 1. Recommend palliative medicine consultation at the assisted living facility for goals of care   DISCHARGE CONDITION: fair  Diet recommendation: Dysphagia 3 diet with thin liquids with full aspiration precautions  Filed Weights   10/09/18 1851  Weight: 58.1 kg    INITIAL HISTORY: 52 y.o.malewith medical history significant ofmyotonic muscular dystrophy, hypertension, nephrolithiasis, history of deep vein thrombosis left brachial vein.  Patient presented with generalized weakness, poor appetite, dizziness and cough.  Concern was for pneumonia.  He was hospitalized for further management.  Patient was also noted to be hypoxic in the emergency department.  Consultants:   None  Procedures:   None   HOSPITAL COURSE:   Acute hypoxic respiratory failure 2/2 CAP CT chest with bilateral lower lobe pneumonia vs atelectasis.  Patient was given a dose of Levaquin on 12/9.  Started on ceftriaxone and azithromycin..  WBC has been normal.  Unfortunately no cultures were sent.  There was also concern for aspiration and fluid distended esophagus was noted on CT scan.  Speech therapy was consulted.  Esophagogram does not show any obvious abnormality in the esophagus.    Dysphagia 3 diet recommended by speech therapy.  Patient's antibiotics were changed over to Augmentin.  Respiratory status remains stable.  Room air saturations will be checked prior to discharge.  Dysphagia, likely oropharyngeal Seen by  speech therapy.  No abnormalities noted on esophagram.  Dysphagia 3 diet recommended by speech therapy.  Palliative medicine consult to be considered at his assisted living facility.  Oral thrush Continue nystatin  Dyslipidemia Continue atorvastatin,and fenofibrate.  GERD Continue famotidine.  BPH Continue Flomax.    Has been able to urinate in the hospital.  Depression Continue mirtazapine.  History of muscular dystrophy Continue supportive medical therapy. Patient is wheelchair-bound.   Hypokalemia Potassium was repleted.  Overall stable.  Okay for discharge back to his assisted living facility.  He is DNR.  Room air saturations to be checked prior to discharge.     PERTINENT LABS:  The results of significant diagnostics from this hospitalization (including imaging, microbiology, ancillary and laboratory) are listed below for reference.      Labs: Basic Metabolic Panel: Recent Labs  Lab 10/09/18 0932 10/10/18 0532 10/11/18 0531  NA 146* 144 146*  K 3.8 3.0* 4.3  CL 103 105 113*  CO2 32 29 30  GLUCOSE 104* 113* 109*  BUN 9 7 6   CREATININE 0.51* 0.48* 0.49*  CALCIUM 9.2 8.3* 8.5*  MG  --  2.1 1.8   CBC: Recent Labs  Lab 10/09/18 1055 10/10/18 0532 10/11/18 0531  WBC 4.2 6.2 7.1  NEUTROABS 2.3  --   --   HGB 14.5 13.3 12.7*  HCT 46.9 42.5 40.8  MCV 93.2 92.2 92.7  PLT 109* 147* 170   Cardiac Enzymes: Recent Labs  Lab 10/09/18 0932  TROPONINI 0.03*   BNP: BNP (last 3 results) Recent Labs    10/09/18 1055  BNP 24.6     IMAGING STUDIES Ct Angio Chest Pe W Or Wo Contrast  Result Date: 10/09/2018 CLINICAL DATA:  Generalized weakness, dyspnea and hypoxemia. History of  deep vein thrombosis LEFT arm, muscular dystrophy. EXAM: CT ANGIOGRAPHY CHEST WITH CONTRAST TECHNIQUE: Multidetector CT imaging of the chest was performed using the standard protocol during bolus administration of intravenous contrast. Multiplanar CT image  reconstructions and MIPs were obtained to evaluate the vascular anatomy. CONTRAST:  ISOVUE-370 IOPAMIDOL (ISOVUE-370) INJECTION 76% COMPARISON:  Chest radiograph October 09, 2018 FINDINGS: CARDIOVASCULAR: Adequate contrast opacification of the pulmonary artery's. Main pulmonary artery is not enlarged. No pulmonary arterial filling defects to the level of the subsegmental branches. Heart size is normal, no right heart strain. No pericardial effusion. Patent RIGHT bypass graft coursing through RIGHT chest, origin incompletely evaluated. MEDIASTINUM/NODES: No lymphadenopathy by CT size criteria. Mildly patulous fluid-filled esophagus, aspiration risk. LUNGS/PLEURA: Tracheobronchial tree is patent, no pneumothorax. LEFT greater than RIGHT lower lobe hypoenhancing consolidation with air bronchograms. Mild bronchial wall thickening. RIGHT middle lobe atelectasis. No pleural effusion. Faint RIGHT upper lobe tree-in-bud infiltrates. Lingular atelectasis. UPPER ABDOMEN: Non-acute.  Status post cholecystectomy. MUSCULOSKELETAL: Non-acute. Extensive muscle atrophy compatible with history of muscular dystrophy. Small posterior upper thoracic sebaceous cyst. Mild thoracic spondylosis. Dense T11-12 disc. Review of the MIP images confirms the above findings. IMPRESSION: 1. No acute pulmonary embolism. 2. Bilateral lower lobe pneumonia versus atelectasis. Minimal tree-in-bud infiltrates may be infectious or inflammatory. 3. Mild fluid distended esophagus, aspiration risk. Electronically Signed   By: Awilda Metro M.D.   On: 10/09/2018 20:25   Dg Esophagus  Result Date: 10/11/2018 CLINICAL DATA:  Dysphagia with chronic cough.  Muscular dystrophy. EXAM: ESOPHOGRAM/BARIUM SWALLOW TECHNIQUE: Single contrast examination was performed using  thin barium. FLUOROSCOPY TIME:  Fluoroscopy Time: Study was performed immediately following the modified speech swallow. Separate fluoro times were not recorded, but approximately 30  seconds of low-dose pulsed fluoro was utilized for this portion of the study. Radiation Exposure Index (if provided by the fluoroscopic device): Not accurately recorded. Number of Acquired Spot Images: 0 COMPARISON:  Modified examination performed preceding. Chest CT 10/09/2018. FINDINGS: Limited study was performed, evaluating the thoracic esophagus in the lateral projection with the patient seated in the modified speech chair. Repeat imaging of the cervical esophagus was not performed. The esophageal motility is normal. There is no stricture, mass or ulceration. Barium flows freely through the gastroesophageal junction. A barium tablet was not administered. IMPRESSION: Normal limited examination of the thoracic esophagus. Electronically Signed   By: Carey Bullocks M.D.   On: 10/11/2018 09:45   Dg Chest Port 1 View  Result Date: 10/09/2018 CLINICAL DATA:  Shortness of breath and nonproductive cough for the past week. Decreased oxygen saturation. EXAM: PORTABLE CHEST 1 VIEW COMPARISON:  Chest x-ray of August 29, 2017 FINDINGS: The lungs are adequately inflated. There's hazy increased density in the lingula. There's no pleural effusion or pneumothorax. The heart is top-normal in size. The pulmonary vascularity is normal. The trachea is midline. The bony thorax exhibits no acute abnormality. IMPRESSION: Lingular atelectasis or pneumonia. Followup PA and lateral chest X-ray is recommended in 3-4 weeks following trial of antibiotic therapy to ensure resolution and exclude underlying malignancy. Electronically Signed   By: David  [place]and M.D.   On: 10/09/2018 10:11    DISCHARGE EXAMINATION: Vitals:   10/11/18 2034 10/11/18 2044 10/12/18 0600 10/12/18 0917  BP: 103/71  115/80   Pulse: 73  75   Resp: 18  18   Temp: 98.7 F (37.1 C)  97.9 F (36.6 C)   TempSrc: Oral     SpO2: 90% 92% 97% 95%  Weight:  Height:       General appearance: alert, cooperative, appears stated age and no distress Head:  Normocephalic, without obvious abnormality, atraumatic Resp: Normal effort at rest.  Diminished air entry at the bases with a few crackles bilaterally.  No wheezing or rhonchi. Cardio: regular rate and rhythm, S1, S2 normal, no murmur, click, rub or gallop GI: soft, non-tender; bowel sounds normal; no masses,  no organomegaly  DISPOSITION: Back to his assisted living facility  Discharge Instructions    Call MD for:  difficulty breathing, headache or visual disturbances   Complete by:  As directed    Call MD for:  extreme fatigue   Complete by:  As directed    Call MD for:  persistant dizziness or light-headedness   Complete by:  As directed    Call MD for:  persistant nausea and vomiting   Complete by:  As directed    Call MD for:  severe uncontrolled pain   Complete by:  As directed    Call MD for:  temperature >100.4   Complete by:  As directed    Discharge instructions   Complete by:  As directed    Please review instructions on the discharge summary.  You were cared for by a hospitalist during your hospital stay. If you have any questions about your discharge medications or the care you received while you were in the hospital after you are discharged, you can call the unit and asked to speak with the hospitalist on call if the hospitalist that took care of you is not available. Once you are discharged, your primary care physician will handle any further medical issues. Please note that NO REFILLS for any discharge medications will be authorized once you are discharged, as it is imperative that you return to your primary care physician (or establish a relationship with a primary care physician if you do not have one) for your aftercare needs so that they can reassess your need for medications and monitor your lab values. If you do not have a primary care physician, you can call [phone] for a physician referral.   Increase activity slowly   Complete by:  As directed          Allergies as of 10/12/2018   No Known Allergies     Medication List    TAKE these medications   amoxicillin-clavulanate 875-125 MG tablet Commonly known as:  AUGMENTIN Take 1 tablet by mouth every 12 (twelve) hours for 5 days.   aspirin EC 81 MG tablet Take 1 tablet (81 mg total) by mouth daily.   atorvastatin 40 MG tablet Commonly known as:  LIPITOR Take 40 mg by mouth every evening.   azithromycin 500 MG tablet Commonly known as:  ZITHROMAX Take 1 tablet (500 mg total) by mouth daily for 3 days.   benzonatate 100 MG capsule Commonly known as:  TESSALON Take 100 mg by mouth 3 (three) times daily.   famotidine 20 MG tablet Commonly known as:  PEPCID Take 20 mg by mouth 2 (two) times daily.   fenofibrate 145 MG tablet Commonly known as:  TRICOR Take 145 mg by mouth daily.   guaiFENesin 600 MG 12 hr tablet Commonly known as:  MUCINEX Take 600 mg by mouth every 12 (twelve) hours.   HYDROcodone-acetaminophen 5-325 MG tablet Commonly known as:  NORCO/VICODIN Take 1 tablet by mouth every 4 (four) hours as needed for moderate pain.   magnesium oxide 400 MG tablet Commonly known as:  MAG-OX  Take 1 tablet (400 mg total) by mouth daily.   methocarbamol 500 MG tablet Commonly known as:  ROBAXIN Take 1 tablet (500 mg total) by mouth 2 (two) times daily as needed for muscle spasms.   mirtazapine 15 MG tablet Commonly known as:  REMERON Take 15 mg by mouth at bedtime.   multivitamin Tabs tablet Take 1 tablet by mouth daily.   nystatin 100000 UNIT/ML suspension Commonly known as:  MYCOSTATIN Take 5 mLs (500,000 Units total) by mouth 4 (four) times daily for 10 days.   potassium chloride SA 20 MEQ tablet Commonly known as:  K-DUR,KLOR-CON Take 2 tablets (40 mEq total) by mouth every Monday, Wednesday, and Friday.   tamsulosin 0.4 MG Caps capsule Commonly known as:  FLOMAX Take 1 capsule (0.4 mg total) by mouth daily after breakfast.   vitamin B-12 1000  MCG tablet Commonly known as:  CYANOCOBALAMIN Take 1,000 mcg by mouth daily.          TOTAL DISCHARGE TIME: 35 minutes  Osvaldo Shipper  [facility] Pager [phone]  10/12/2018, 11:17 AM

## 2018-10-12 NOTE — Progress Notes (Signed)
[facility] Disposition [facility].   Beulah GandyBernette Vanessa Alesi, LSCW [facility] CSW [phone]

## 2018-10-12 NOTE — Care Management Note (Signed)
Case Management Note  Patient Details  Name: Malik [name] MRN: [id number] Date of Birth: 04-Aug-1966  Subjective/Objective: d/c back to ALF. No HHC needs or orders.CSW managing d/c.                   Action/Plan:dc ALF   Expected Discharge Date:  10/12/18               Expected Discharge Plan:  Assisted Living / Rest Home  In-House Referral:  Clinical Social Work  Discharge planning Services  CM Consult  Post Acute Care Choice:    Choice offered to:     DME Arranged:    DME Agency:     HH Arranged:    HH Agency:     Status of Service:  Completed, signed off  If discussed at [employer] Meetings, dates discussed:    Additional Comments:  Malik [name], Malik Wollam, RN 10/12/2018, 12:13 PM

## 2018-10-12 NOTE — NC FL2 (Addendum)
[place] MEDICAID FL2 LEVEL OF CARE SCREENING TOOL     IDENTIFICATION  Patient Name: Malik [name] Birthdate: [birth year] Sex: male Admission Date (Current Location): 10/09/2018  [facility] and [place] Number:  Producer, television/film/video and Address:  [facility],  [address] [place], [place] [address]      Provider Number: [id number]  Attending Physician Name and Address:  Osvaldo Shipper, MD  Relative Name and Phone Number:  Demetrius Charity) Blenda Mount[place], [phone]    Current Level of Care: Hospital Recommended Level of Care: Skilled nursing facility Prior Approval Number:    Date Approved/Denied:   PASRR Number:   [id number]  Discharge Plan: Skilled nursing facility   Current Diagnoses: Patient Active Problem List   Diagnosis Date Noted  . Hypoxia 10/09/2018  . Abdominal pain 01/06/2018  . Pressure injury of skin 01/06/2018  . Ureteral stone with hydronephrosis 01/06/2018  . Hypernatremia 01/06/2018  . Bilateral ureteral calculi   . PAD (peripheral artery disease) (HCC) 06/07/2016  . Leg pain, bilateral 06/02/2016  . Esophageal obstruction due to food impaction   . DVT of upper extremity (deep vein thrombosis) (HCC) 01/29/2016  . Residence in long term care facility 03/04/2014  . EKG abnormality 11/09/2013  . Community acquired pneumonia 11/08/2013  . Health care maintenance 06/22/2013  . Epidermal cyst 06/22/2013  . Skin lesion of hand 06/22/2013  . 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 Hypertension, benign 01/20/2012  . Unspecified vitamin D deficiency 08/05/2011  . Vitamin B 12 deficiency 07/14/2011  . 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 Muscle weakness (generalized) 04/16/2011  . Weight finding 04/16/2011  . History of tobacco use 04/16/2011    Orientation RESPIRATION BLADDER Height & Weight     Time, Self, Situation, Place  O2(2L) Continent Weight: 128 lb (58.1 kg) Height:  5\' 7"  (170.2 cm)  BEHAVIORAL SYMPTOMS/MOOD NEUROLOGICAL BOWEL NUTRITION  STATUS      Continent Diet(clear liquid)  AMBULATORY STATUS COMMUNICATION OF NEEDS Skin   Extensive Assist   Normal                       Personal Care Assistance Level of Assistance  Bathing, Feeding, Dressing Bathing Assistance: Maximum assistance Feeding assistance: Maximum assistance Dressing Assistance: Maximum assistance     Functional Limitations Info  Speech, Sight, Hearing Sight Info: Adequate Hearing Info: Adequate      SPECIAL CARE FACTORS FREQUENCY  OT (By licensed OT), PT (By licensed PT)     PT Frequency: 3x wk home OT Frequency: 3x wk home            Contractures Contractures Info: Not present    Additional Factors Info  Code Status, Allergies Code Status Info: DNR Allergies Info: no know allergies            Current Medications (10/12/2018):  This is the current hospital active medication list Current Facility-Administered Medications  Medication Dose Route Frequency Provider Last Rate Last Dose  . acetaminophen (TYLENOL) tablet 650 mg  650 mg Oral Q6H PRN Arrien, York Ram, MD       Or  . acetaminophen (TYLENOL) suppository 650 mg  650 mg Rectal Q6H PRN Arrien, York Ram, MD      . amoxicillin-clavulanate (AUGMENTIN) 875-125 MG per tablet 1 tablet  1 tablet Oral Q12H Osvaldo Shipper, MD   1 tablet at 10/12/18 0940  . aspirin EC tablet 81 mg  81 mg Oral Daily Arrien, York Ram, MD   81 mg at 10/12/18  0940  . atorvastatin (LIPITOR) tablet 40 mg  40 mg Oral QPM Arrien, York RamMauricio Daniel, MD   40 mg at 10/11/18 1705  . azithromycin (ZITHROMAX) tablet 500 mg  500 mg Oral Q24H Zigmund DanielPowell, A Caldwell Jr., MD   500 mg at 10/11/18 1346  . benzonatate (TESSALON) capsule 100 mg  100 mg Oral TID Coralie KeensArrien, Mauricio Daniel, MD   100 mg at 10/12/18 0940  . dextrose 5 %-0.9 % sodium chloride infusion   Intravenous Continuous Coralie KeensArrien, Mauricio Daniel, MD 75 mL/hr at 10/11/18 1710    . enoxaparin (LOVENOX) injection 40 mg  40 mg Subcutaneous Q24H  Coralie KeensArrien, Mauricio Daniel, MD   40 mg at 10/11/18 2033  . famotidine (PEPCID) tablet 20 mg  20 mg Oral BID Coralie KeensArrien, Mauricio Daniel, MD   20 mg at 10/12/18 0939  . fenofibrate tablet 54 mg  54 mg Oral Daily Arrien, York RamMauricio Daniel, MD   54 mg at 10/12/18 0940  . guaiFENesin (MUCINEX) 12 hr tablet 600 mg  600 mg Oral Q12H Arrien, York RamMauricio Daniel, MD   600 mg at 10/12/18 0940  . guaiFENesin-dextromethorphan (ROBITUSSIN DM) 100-10 MG/5ML syrup 5 mL  5 mL Oral Q4H PRN Zigmund DanielPowell, A Caldwell Jr., MD   5 mL at 10/11/18 [phone]  . HYDROcodone-acetaminophen (NORCO/VICODIN) 5-325 MG per tablet 1 tablet  1 tablet Oral Q4H PRN Arrien, York RamMauricio Daniel, MD      . ipratropium-albuterol (DUONEB) 0.5-2.5 (3) MG/3ML nebulizer solution 3 mL  3 mL Nebulization Q2H PRN Arrien, York RamMauricio Daniel, MD      . ipratropium-albuterol (DUONEB) 0.5-2.5 (3) MG/3ML nebulizer solution 3 mL  3 mL Nebulization TID Arrien, York RamMauricio Daniel, MD   3 mL at 10/12/18 0917  . magnesium oxide (MAG-OX) tablet 400 mg  400 mg Oral Daily Arrien, York RamMauricio Daniel, MD   400 mg at 10/12/18 0940  . methocarbamol (ROBAXIN) tablet 500 mg  500 mg Oral BID PRN Arrien, York RamMauricio Daniel, MD      . mirtazapine (REMERON) tablet 15 mg  15 mg Oral QHS Coralie KeensArrien, Mauricio Daniel, MD   15 mg at 10/11/18 2032  . multivitamin with minerals tablet 1 tablet  1 tablet Oral Daily Arrien, York RamMauricio Daniel, MD   1 tablet at 10/12/18 0940  . nystatin (MYCOSTATIN) 100000 UNIT/ML suspension 500,000 Units  5 mL Oral QID Zigmund DanielPowell, A Caldwell Jr., MD   500,000 Units at 10/12/18 [phone]  . ondansetron (ZOFRAN) tablet 4 mg  4 mg Oral Q6H PRN Arrien, York RamMauricio Daniel, MD       Or  . ondansetron [facility](ZOFRAN) injection 4 mg  4 mg Intravenous Q6H PRN Arrien, York RamMauricio Daniel, MD      . tamsulosin [facility](FLOMAX) capsule 0.4 mg  0.4 mg Oral QPC breakfast Arrien, York RamMauricio Daniel, MD   0.4 mg at 10/12/18 0940  . vitamin B-12 (CYANOCOBALAMIN) tablet 1,000 mcg  1,000 mcg Oral Daily Arrien, York RamMauricio Daniel, MD   1,000 mcg  at 10/12/18 0940     Discharge Medications: TAKE these medications   amoxicillin-clavulanate 875-125 MG tablet Commonly known as:  AUGMENTIN Take 1 tablet by mouth every 12 (twelve) hours for 5 days.   aspirin EC 81 MG tablet Take 1 tablet (81 mg total) by mouth daily.   atorvastatin 40 MG tablet Commonly known as:  LIPITOR Take 40 mg by mouth every evening.   azithromycin 500 MG tablet Commonly known as:  ZITHROMAX Take 1 tablet (500 mg total) by mouth daily for 3 days.   benzonatate 100 MG capsule  Commonly known as:  TESSALON Take 100 mg by mouth 3 (three) times daily.   famotidine 20 MG tablet Commonly known as:  PEPCID Take 20 mg by mouth 2 (two) times daily.   fenofibrate 145 MG tablet Commonly known as:  TRICOR Take 145 mg by mouth daily.   guaiFENesin 600 MG 12 hr tablet Commonly known as:  MUCINEX Take 600 mg by mouth every 12 (twelve) hours.   HYDROcodone-acetaminophen 5-325 MG tablet Commonly known as:  NORCO/VICODIN Take 1 tablet by mouth every 4 (four) hours as needed for moderate pain.   magnesium oxide 400 MG tablet Commonly known as:  MAG-OX Take 1 tablet (400 mg total) by mouth daily.   methocarbamol 500 MG tablet Commonly known as:  ROBAXIN Take 1 tablet (500 mg total) by mouth 2 (two) times daily as needed for muscle spasms.   mirtazapine 15 MG tablet Commonly known as:  REMERON Take 15 mg by mouth at bedtime.   multivitamin Tabs tablet Take 1 tablet by mouth daily.   nystatin 100000 UNIT/ML suspension Commonly known as:  MYCOSTATIN Take 5 mLs (500,000 Units total) by mouth 4 (four) times daily for 10 days.   potassium chloride SA 20 MEQ tablet Commonly known as:  K-DUR,KLOR-CON Take 2 tablets (40 mEq total) by mouth every Monday, Wednesday, and Friday.   tamsulosin 0.4 MG Caps capsule Commonly known as:  FLOMAX Take 1 capsule (0.4 mg total) by mouth daily after breakfast.   vitamin B-12 1000 MCG tablet Commonly  known as:  CYANOCOBALAMIN Take 1,000 mcg by mouth daily.   Relevant Imaging Results:  Relevant Lab Results:   Additional Information SS#[id number]  Althea Charon, LCSW

## 2018-10-12 NOTE — Progress Notes (Signed)
Clinical Social Worker facilitated patient discharge including contacting patient family and facility to confirm patient discharge plans.  Clinical information faxed to facility and family agreeable with plan.  CSW [facility]  .  RN to call [phone] ask for Littie DeedsSyretta Johnson for report prior to discharge.  Clinical Social Worker will sign off for now as social work intervention is no longer needed. Please consult us again if new need arises.  Marrianne MoodAshley Teasia Zapf, MSW, [employer]WA [phone]

## 2018-10-13 MED ORDER — HYDROMORPHONE HCL 1 MG/ML IJ SOLN
INTRAMUSCULAR | Status: AC
  Filled 2018-10-13: qty 1

## 2018-10-13 NOTE — Discharge Summary (Signed)
[facility]  Physician Discharge Summary   Patient ID: Malik [name] MRN: [id number] DOB/AGE: [birth year] 54 y.o.  Admit date: 10/09/2018 Discharge date: 10/13/2018  PCP: Lovena Neighboursiallo, Abdoulaye, MD  DISCHARGE DIAGNOSES:  Community-acquired versus aspiration pneumonia Acute hypoxic respiratory failure, improved Oropharyngeal dysphagia Oral thrush Dyslipidemia GERD BPH History of muscular dystrophy and wheelchair-bound as a result  RECOMMENDATIONS FOR OUTPATIENT FOLLOW UP: 1. Recommend palliative medicine consultation at the SNF for goals of care   DISCHARGE CONDITION: fair  Diet recommendation: Dysphagia 3 diet with thin liquids with full aspiration precautions  Filed Weights   10/09/18 1851  Weight: 58.1 kg    INITIAL HISTORY: 52 y.o.malewith medical history significant ofmyotonic muscular dystrophy, hypertension, nephrolithiasis, history of deep vein thrombosis left brachial vein.  Patient presented with generalized weakness, poor appetite, dizziness and cough.  Concern was for pneumonia.  He was hospitalized for further management.  Patient was also noted to be hypoxic in the emergency department.  Consultants:   None  Procedures:   None   HOSPITAL COURSE:   Acute hypoxic respiratory failure 2/2 CAP CT chest with bilateral lower lobe pneumonia vs atelectasis.  Patient was given a dose of Levaquin on 12/9.  Started on ceftriaxone and azithromycin..  WBC has been normal.  Unfortunately no cultures were sent.  There was also concern for aspiration and fluid distended esophagus was noted on CT scan.  Speech therapy was consulted.  Esophagogram does not show any obvious abnormality in the esophagus.    Dysphagia 3 diet recommended by speech therapy.  Patient's antibiotics were changed over to Augmentin.  Respiratory status remains stable.  Room air saturations will be checked prior to discharge.  Dysphagia, likely oropharyngeal Seen by speech therapy.  No  abnormalities noted on esophagram.  Dysphagia 3 diet recommended by speech therapy.  Palliative medicine consult to be considered at SNF.  Oral thrush Continue nystatin  Dyslipidemia Continue atorvastatin,and fenofibrate.  GERD Continue famotidine.  BPH Continue Flomax.    Has been able to urinate in the hospital.  Depression Continue mirtazapine.  History of muscular dystrophy Continue supportive medical therapy. Patient is wheelchair-bound.   Hypokalemia Potassium was repleted.  Overall stable.  Okay for discharge to SNF.  He is DNR.  Room air saturations to be checked prior to discharge.     PERTINENT LABS:  The results of significant diagnostics from this hospitalization (including imaging, microbiology, ancillary and laboratory) are listed below for reference.      Labs: Basic Metabolic Panel: Recent Labs  Lab 10/09/18 0932 10/10/18 0532 10/11/18 0531  NA 146* 144 146*  K 3.8 3.0* 4.3  CL 103 105 113*  CO2 32 29 30  GLUCOSE 104* 113* 109*  BUN 9 7 6   CREATININE 0.51* 0.48* 0.49*  CALCIUM 9.2 8.3* 8.5*  MG  --  2.1 1.8   CBC: Recent Labs  Lab 10/09/18 1055 10/10/18 0532 10/11/18 0531  WBC 4.2 6.2 7.1  NEUTROABS 2.3  --   --   HGB 14.5 13.3 12.7*  HCT 46.9 42.5 40.8  MCV 93.2 92.2 92.7  PLT 109* 147* 170   Cardiac Enzymes: Recent Labs  Lab 10/09/18 0932  TROPONINI 0.03*   BNP: BNP (last 3 results) Recent Labs    10/09/18 1055  BNP 24.6     IMAGING STUDIES Ct Angio Chest Pe W Or Wo Contrast  Result Date: 10/09/2018 CLINICAL DATA:  Generalized weakness, dyspnea and hypoxemia. History of deep vein thrombosis LEFT arm, muscular dystrophy. EXAM: CT  ANGIOGRAPHY CHEST WITH CONTRAST TECHNIQUE: Multidetector CT imaging of the chest was performed using the standard protocol during bolus administration of intravenous contrast. Multiplanar CT image reconstructions and MIPs were obtained to evaluate the vascular anatomy. CONTRAST:   ISOVUE-370 IOPAMIDOL (ISOVUE-370) INJECTION 76% COMPARISON:  Chest radiograph October 09, 2018 FINDINGS: CARDIOVASCULAR: Adequate contrast opacification of the pulmonary artery's. Main pulmonary artery is not enlarged. No pulmonary arterial filling defects to the level of the subsegmental branches. Heart size is normal, no right heart strain. No pericardial effusion. Patent RIGHT bypass graft coursing through RIGHT chest, origin incompletely evaluated. MEDIASTINUM/NODES: No lymphadenopathy by CT size criteria. Mildly patulous fluid-filled esophagus, aspiration risk. LUNGS/PLEURA: Tracheobronchial tree is patent, no pneumothorax. LEFT greater than RIGHT lower lobe hypoenhancing consolidation with air bronchograms. Mild bronchial wall thickening. RIGHT middle lobe atelectasis. No pleural effusion. Faint RIGHT upper lobe tree-in-bud infiltrates. Lingular atelectasis. UPPER ABDOMEN: Non-acute.  Status post cholecystectomy. MUSCULOSKELETAL: Non-acute. Extensive muscle atrophy compatible with history of muscular dystrophy. Small posterior upper thoracic sebaceous cyst. Mild thoracic spondylosis. Dense T11-12 disc. Review of the MIP images confirms the above findings. IMPRESSION: 1. No acute pulmonary embolism. 2. Bilateral lower lobe pneumonia versus atelectasis. Minimal tree-in-bud infiltrates may be infectious or inflammatory. 3. Mild fluid distended esophagus, aspiration risk. Electronically Signed   By: Awilda Metro M.D.   On: 10/09/2018 20:25   Dg Esophagus  Result Date: 10/11/2018 CLINICAL DATA:  Dysphagia with chronic cough.  Muscular dystrophy. EXAM: ESOPHOGRAM/BARIUM SWALLOW TECHNIQUE: Single contrast examination was performed using  thin barium. FLUOROSCOPY TIME:  Fluoroscopy Time: Study was performed immediately following the modified speech swallow. Separate fluoro times were not recorded, but approximately 30 seconds of low-dose pulsed fluoro was utilized for this portion of the study.  Radiation Exposure Index (if provided by the fluoroscopic device): Not accurately recorded. Number of Acquired Spot Images: 0 COMPARISON:  Modified examination performed preceding. Chest CT 10/09/2018. FINDINGS: Limited study was performed, evaluating the thoracic esophagus in the lateral projection with the patient seated in the modified speech chair. Repeat imaging of the cervical esophagus was not performed. The esophageal motility is normal. There is no stricture, mass or ulceration. Barium flows freely through the gastroesophageal junction. A barium tablet was not administered. IMPRESSION: Normal limited examination of the thoracic esophagus. Electronically Signed   By: Carey Bullocks M.D.   On: 10/11/2018 09:45   Dg Chest Port 1 View  Result Date: 10/09/2018 CLINICAL DATA:  Shortness of breath and nonproductive cough for the past week. Decreased oxygen saturation. EXAM: PORTABLE CHEST 1 VIEW COMPARISON:  Chest x-ray of August 29, 2017 FINDINGS: The lungs are adequately inflated. There's hazy increased density in the lingula. There's no pleural effusion or pneumothorax. The heart is top-normal in size. The pulmonary vascularity is normal. The trachea is midline. The bony thorax exhibits no acute abnormality. IMPRESSION: Lingular atelectasis or pneumonia. Followup PA and lateral chest X-ray is recommended in 3-4 weeks following trial of antibiotic therapy to ensure resolution and exclude underlying malignancy. Electronically Signed   By: David  [place] M.D.   On: 10/09/2018 10:11     DISPOSITION: SNF  Discharge Instructions    Call MD for:  difficulty breathing, headache or visual disturbances   Complete by:  As directed    Call MD for:  extreme fatigue   Complete by:  As directed    Call MD for:  persistant dizziness or light-headedness   Complete by:  As directed    Call MD for:  persistant nausea  and vomiting   Complete by:  As directed    Call MD for:  severe uncontrolled pain   Complete  by:  As directed    Call MD for:  temperature >100.4   Complete by:  As directed    Discharge instructions   Complete by:  As directed    Please review instructions on the discharge summary.  You were cared for by a hospitalist during your hospital stay. If you have any questions about your discharge medications or the care you received while you were in the hospital after you are discharged, you can call the unit and asked to speak with the hospitalist on call if the hospitalist that took care of you is not available. Once you are discharged, your primary care physician will handle any further medical issues. Please note that NO REFILLS for any discharge medications will be authorized once you are discharged, as it is imperative that you return to your primary care physician (or establish a relationship with a primary care physician if you do not have one) for your aftercare needs so that they can reassess your need for medications and monitor your lab values. If you do not have a primary care physician, you can call [phone] for a physician referral.   Increase activity slowly   Complete by:  As directed         Allergies as of 10/13/2018   No Known Allergies     Medication List    TAKE these medications   amoxicillin-clavulanate 875-125 MG tablet Commonly known as:  AUGMENTIN Take 1 tablet by mouth every 12 (twelve) hours for 5 days.   aspirin EC 81 MG tablet Take 1 tablet (81 mg total) by mouth daily.   atorvastatin 40 MG tablet Commonly known as:  LIPITOR Take 40 mg by mouth every evening.   azithromycin 500 MG tablet Commonly known as:  ZITHROMAX Take 1 tablet (500 mg total) by mouth daily for 3 days.   benzonatate 100 MG capsule Commonly known as:  TESSALON Take 100 mg by mouth 3 (three) times daily.   famotidine 20 MG tablet Commonly known as:  PEPCID Take 20 mg by mouth 2 (two) times daily.   fenofibrate 145 MG tablet Commonly known as:  TRICOR Take 145 mg by  mouth daily.   guaiFENesin 600 MG 12 hr tablet Commonly known as:  MUCINEX Take 600 mg by mouth every 12 (twelve) hours.   HYDROcodone-acetaminophen 5-325 MG tablet Commonly known as:  NORCO/VICODIN Take 1 tablet by mouth every 4 (four) hours as needed for moderate pain.   magnesium oxide 400 MG tablet Commonly known as:  MAG-OX Take 1 tablet (400 mg total) by mouth daily.   methocarbamol 500 MG tablet Commonly known as:  ROBAXIN Take 1 tablet (500 mg total) by mouth 2 (two) times daily as needed for muscle spasms.   mirtazapine 15 MG tablet Commonly known as:  REMERON Take 15 mg by mouth at bedtime.   multivitamin Tabs tablet Take 1 tablet by mouth daily.   nystatin 100000 UNIT/ML suspension Commonly known as:  MYCOSTATIN Take 5 mLs (500,000 Units total) by mouth 4 (four) times daily for 10 days.   potassium chloride SA 20 MEQ tablet Commonly known as:  K-DUR,KLOR-CON Take 2 tablets (40 mEq total) by mouth every Monday, Wednesday, and Friday.   tamsulosin 0.4 MG Caps capsule Commonly known as:  FLOMAX Take 1 capsule (0.4 mg total) by mouth daily after breakfast.   vitamin  B-12 1000 MCG tablet Commonly known as:  CYANOCOBALAMIN Take 1,000 mcg by mouth daily.          TOTAL DISCHARGE TIME: 35 minutes  Osvaldo Shipper  [facility] Pager [phone]  10/13/2018, 2:24 PM

## 2018-10-13 NOTE — Progress Notes (Signed)
Patient did not leave yesterday due to change in disposition.  Apparently going to a different facility now.  No issues overnight.  Patient seen and examined.  Patient denies any complaints.  Waiting to get out of the hospital.  Vital signs have been stable.  Examination is unchanged.  Treatment plan is unchanged.  See discharge summary from yesterday.  Remains stable for discharge.  Malik [name] 10/13/2018

## 2018-10-13 NOTE — Progress Notes (Signed)
Pt admitting [facility]- [id number].  (see previous CSW progress note re: change of disposition from ALF to SNF) [facility] transport arranged.  Ilean SkillMeghan Latarshia Jersey, MSW, LCSW Clinical Social Work 10/13/2018 [phone]

## 2018-10-27 ENCOUNTER — Non-Acute Institutional Stay: Payer: Medicaid Other | Admitting: Internal Medicine

## 2018-10-27 DIAGNOSIS — Z515 Encounter for palliative care: Secondary | ICD-10-CM

## 2018-11-09 ENCOUNTER — Ambulatory Visit (INDEPENDENT_AMBULATORY_CARE_PROVIDER_SITE_OTHER): Payer: Medicaid Other | Admitting: Family

## 2018-11-09 ENCOUNTER — Ambulatory Visit (HOSPITAL_COMMUNITY)
Admission: RE | Admit: 2018-11-09 | Discharge: 2018-11-09 | Disposition: A | Discharge status: Home / Self Care | Payer: Medicaid Other | Source: Ambulatory Visit | Attending: Family | Admitting: Family

## 2018-11-09 ENCOUNTER — Encounter: Payer: Self-pay | Admitting: Family

## 2018-11-09 ENCOUNTER — Other Ambulatory Visit: Payer: Self-pay

## 2018-11-09 VITALS — BP 94/66 | HR 58 | Temp 96.4°F | Resp 16 | Ht 67.0 in | Wt 128.0 lb

## 2018-11-09 DIAGNOSIS — I779 Disorder of arteries and arterioles, unspecified: Secondary | ICD-10-CM | POA: Diagnosis present

## 2018-11-09 DIAGNOSIS — Z87891 Personal history of nicotine dependence: Secondary | ICD-10-CM

## 2018-11-09 NOTE — Progress Notes (Signed)
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 male who is s/pright axillary bifemoral bypasson 03-07-17 by Dr. Darrick Penna for rest pain in left foot. He is a resident of a skilled nursing facility, is here by Doctor, general practice.  When I saw him in March 2019 he was in a wheelchair. He is no longer able to get into a wheelchair. He has myotonic muscular dystrophy; this has apparently advanced, and he is now bedridden.   Dr. Darrick Penna last evaluated pt on 03-31-17. At that timeright flank bypass graft was palpable,well-healed axillary and groin incisions,no erythema over the graft. Doing well status post axillary bifemoral bypass graft. The patient was shown how to check the pulse in his graft thatday and he will call us if he does not find this pulse. Otherwise hewas tofollow-up with [place] with a graft duplex scan ABIs in 3 months.   He reports pain at rest in his legs, no signs of ischemia in his feet or legs.   His ability to use his extremities is limited to slight movement of his feet and arms.   His BMI is 20, is thin.    Diabetic: No Tobacco use: former smoker, quit in October 2018, started in 1985, smoked 1 ppd  Pt meds include: Statin :Yes Betablocker: No ASA: Yes Other anticoagulants/antiplatelets: no  Past Medical History:  Diagnosis Date  . DVT (deep venous thrombosis) (HCC)    left brachial vein DVT 01/29/16  . Dysphagia   . Hiccups   . High blood pressure   . History of kidney stones   . Hypertension   . Myotonic muscular dystrophy [facility])     Social History Social History   Tobacco Use  . Smoking status: Former Smoker    Packs/day: 0.30    Types: Cigarettes    Start date: 11/02/1983    Last attempt to quit: 08/01/2017    Years since quitting: 1.2  . Smokeless tobacco: Never Used  Substance Use Topics  . Alcohol use: No  . Drug use: No    Types:  Marijuana    Comment: Pt smokes MJ ocassinally.  Denies ever using IV drugs.     Family History Family History  Problem Relation Age of Onset  . COPD Mother   . Diabetes Mother   . Breast cancer Mother   . Cancer Father   . Cancer Sister   . Muscular dystrophy Sister     Past Surgical History:  Procedure Laterality Date  . AXILLARY-FEMORAL BYPASS GRAFT Right 03/07/2017   Procedure: RIGHT AXILLA-BIFEMORAL BYPASS GRAFT USING HEMASHIELD GOLD VASCULAR GRAFT;  Surgeon: [name], MD;  Location: [facility] OR;  Service: Vascular;  Laterality: Right;  . CHOLECYSTECTOMY    . ENDARTERECTOMY FEMORAL Left 03/07/2017   Procedure: ENDARTERECTOMY LEFT FEMORAL ARTERY ;  Surgeon: [name], MD;  Location: [facility] OR;  Service: Vascular;  Laterality: Left;  . ESOPHAGOGASTRODUODENOSCOPY N/A 05/10/2016   Procedure: ESOPHAGOGASTRODUODENOSCOPY (EGD);  Surgeon: [name], MD;  Location: [facility];  Service: Endoscopy;  Laterality: N/A;  Please discuss sedation possibilities with Dr. [name]  . ESOPHAGOGASTRODUODENOSCOPY N/A 08/29/2017   Procedure: ESOPHAGOGASTRODUODENOSCOPY (EGD);  Surgeon: [name], MD;  Location: [facility];  Service: Endoscopy;  Laterality: N/A;  . MULTIPLE TOOTH EXTRACTIONS  2007   Pt had all teeth pulled, has not yet gotten dentures.   Marland Kitchen PATCH ANGIOPLASTY Left 03/07/2017  Procedure: PATCH ANGIOPLASTY LEFT FEMORAL ARTERY USING Livia Snellen BIOLOGIC PATCH;  Surgeon: [name], MD;  Location[facility] OR;  Service: Vascular;  Laterality: Left;    No Known Allergies  Current Outpatient Medications  Medication Sig Dispense Refill  . aspirin EC 81 MG tablet Take 1 tablet (81 mg total) by mouth daily. 30 tablet 11  . atorvastatin (LIPITOR) 40 MG tablet Take 40 mg by mouth every evening.    . benzonatate (TESSALON) 100 MG capsule Take 100 mg by mouth 3 (three) times daily.    . famotidine (PEPCID) 20 MG tablet Take 20 mg by mouth 2 (two) times daily.    . fenofibrate  (TRICOR) 145 MG tablet Take 145 mg by mouth daily.    Marland Kitchen guaiFENesin (MUCINEX) 600 MG 12 hr tablet Take 600 mg by mouth every 12 (twelve) hours.    Marland Kitchen HYDROcodone-acetaminophen (NORCO/VICODIN) 5-325 MG tablet Take 1 tablet by mouth every 6 (six) hours as needed for moderate pain.    . Magnesium 400 MG TABS Take by mouth.    . magnesium oxide (MAG-OX) 400 MG tablet Take 1 tablet (400 mg total) by mouth daily. 30 tablet 0  . methocarbamol (ROBAXIN) 500 MG tablet Take 1 tablet (500 mg total) by mouth 2 (two) times daily as needed for muscle spasms. 12 tablet 0  . mirtazapine (REMERON) 15 MG tablet Take 15 mg by mouth at bedtime.    . multivitamin (ONE-A-DAY MEN'S) TABS tablet Take 1 tablet by mouth daily. 30 tablet 11  . potassium chloride SA (K-DUR,KLOR-CON) 20 MEQ tablet Take 2 tablets (40 mEq total) by mouth every Monday, Wednesday, and Friday. 30 tablet 0  . tamsulosin (FLOMAX) 0.4 MG CAPS capsule Take 1 capsule (0.4 mg total) by mouth daily after breakfast. 30 capsule 0  . vitamin B-12 (CYANOCOBALAMIN) 1000 MCG tablet Take 1,000 mcg by mouth daily.     No current facility-administered medications for this visit.     ROS: See HPI for pertinent positives and negatives.   Physical Examination  Vitals:   11/09/18 1143 11/09/18 1152  BP: 94/63 94/66  Pulse: (!) 58   Resp: 16   Temp: (!) 96.4 F (35.8 C)   TempSrc: Oral   SpO2: 95%   Weight: 128 lb (58.1 kg)   Height: 5\' 7"  (1.702 m)    Body mass index is 20.05 kg/m.  General: A&O x 3, WDWN, supine on transport stretcher. Generalized mild pallor. Gait: unable to walk HENT: Gaunt appearing face Eyes: PERRLA.  Pulmonary: Respirations are non labored, CTAB, fair air movement in all fields Cardiac: regular rhythm, no detected murmur.         Carotid Bruits Right Left   Negative Negative   Radial pulses are 1+ palpable bilaterally   Adominal aortic pulse is not palpable     Right flank bypass graft pulse is palpable                      VASCULAR EXAM: Extremities without ischemic changes, without Gangrene; without open wounds. Feet with mild to moderate plantar flexion contractures.  LE Pulses Right Left       FEMORAL  faintly palpable  faintly palpable        POPLITEAL  not palpable   not palpable       POSTERIOR TIBIAL  not palpable   not palpable        DORSALIS PEDIS      ANTERIOR TIBIAL not palpable  not palpable    Abdomen: soft, NT, no palpable masses. Wearing a diaper.  Skin: no rashes, no cellulitis, no ulcers noted. Musculoskeletal: significant generalized muscle wasting and atrophy.  Neurologic: A&O X 3; flat affect, Sensation is normal; MOTOR FUNCTION: little movement ability of his arms, unable to move legs, slight movement of feet upon request;  Speech is terse, slightly muffled. CN 2-12 intact. Psychiatric: Thought content is normal, mood appropriate for clinical situation.    ASSESSMENT: Loel RoRobert C Terrero is a 53 y.o. male who is s/pright axillary bifemoral bypasson 03-07-17.  Right flank bypass graft pulse is palpable. Bilateral femoral pulses are faintly palpable. Pedal pulses are not palpable. No signs of ischemia in his feet or legs.  Right flank bypass graft pulse is palpable.  Pt is now bedridden, unable to stand or walk. When I saw him in March 2019 he was in a wheelchair.  He is in a skilled nursing facility now, on a transport stretcher.   He reports pain in his legs, has no signs of ischemia in his feet or legs, no wounds. He has some mild to moderate plantar flexion feet contractions.   He has myotonic muscular dystrophy. He has muscle wasting in his arms and legs.  His atherosclerotic risk factors include former smoker until October of 2018.  Fortunately he does not have DM.  He takes a daily statin and ASA.   I had a discussion with him, that a catheter based  intervention or surgery to improve his arterial perfusion is likely to put him a great risk for complications secondary to the procedures.  He is unable to actively use his arms or legs other than slight movement. He has myotonic muscular dystrophy. His ability to use his muscles has deteriorated significantly since I saw him in March 2019.  Nevertheless, he expressed that he wishes us to continue surveillance of the arterial perfusion of his legs.   DATA  ABI (Date: 11/09/2018):  R:   ABI: 0.75 (was 0.79 on 01-23-18),   PT: mono, dampened  DP: mono  TBI:  0.74, toe pressure 75, (was 0.79)  L:   ABI: 0.77 (was 0.65),   PT: mono, dampened  DP: mono, dampened  TBI: 0.59, toe pressure 60(was 0.37) Dampened monophasic and monophasic bilateral waveforms suggests more severe disease than the ABI's indicate. Waveform morphology has declined over successive ABI's.  ABI's indicate moderate disease bilaterally.   Right axillary bifemoral bypassduplex (10/20/17): Patent right axillary-bifemoral bypass graft without evidence of stenosis, triphasic waveforms throughout.  No change compared to the exam on09/20/18    PLAN:  Based on the patient's vascular studies and examination, and after discussing with Dr. Darrick PennaFields, pt will return to clinic in 6 months with ABI's.  Pt states he wants to continue to have the circulation in his legs checked.  Daily active, to the extent that he can, and passive ROM of all of his extremities.   I discussed in depth with the patient the nature of atherosclerosis, and emphasized the importance of maximal medical management including strict control of blood pressure, blood glucose, and lipid levels,  obtaining regular exercise, and continued cessation of smoking.  The patient is aware that without maximal medical management the underlying atherosclerotic disease process will progress, limiting the benefit of any interventions.   Charisse MarchSuzanne Resa Rinks, RN, MSN,  FNP-C [facility] [place] Office Phone: [phone]  [facility]  11/09/18 12:07 PM

## 2018-11-11 ENCOUNTER — Encounter: Payer: Self-pay | Admitting: Family

## 2018-11-20 ENCOUNTER — Non-Acute Institutional Stay: Payer: Medicaid Other | Admitting: Internal Medicine

## 2018-11-20 DIAGNOSIS — Z515 Encounter for palliative care: Secondary | ICD-10-CM

## 2018-11-20 NOTE — Progress Notes (Signed)
[facility] Telephone: [phone] Fax: [phone]  PATIENT NAME: Malik [name] DOB: 22-Jul-1966 MRN: [id number]  PRIMARY CARE PROVIDER:   Lovena Neighbours, MD  REFERRING PROVIDER: Dr. Ellsworth Lennox RESPONSIBLE PARTY:   Self, aunt and sister       RECOMMENDATIONS and PLAN:    Palliative Care encounter Z51.5  1-Advance care planning:  Pt denies DNAR, DNI and no tube feedings as advanced directives.  He desires comfort care at the facility without return to the hospital.  He will consider transition to Hospice care in light of continued decline of health. Plans on communicating with family for further planning.  Palliative care will continue to follow pt until transition to [facility].  2- Protein calorie malnutrition:  No improvement with refusal of meals intermittently.  Offer supplemental protein/ beverages  3-Weakness- Related to muscular dystrophy, deconditioning and non-compliance with medical treatments/medications.  Expect worsening of symptoms if no change of patient actions.  Continue to offer all meds and at minmum, Famotidine, Aspirin, Mirtazipine and Hydrocodone.  I spent 40 minutes providing this consultation at patient bedside,  from 12:30 to 1:10pm. More than 50% of the time in this consultation was spent coordinating communication with patient, clinical staff and SW.   HISTORY OF PRESENT ILLNESS: Follow-up with Loel Ro. Clinical staff reports that patient continues to refuse medications and eats only intermittently with increased weakness.He remains total care for all ADLS.  CODE STATUS: DNAR/DNI   PPS: 20% HOSPICE ELIGIBILITY/DIAGNOSIS: YES/ Protein calorie malnutrition, muscular dystrophy  PAST MEDICAL HISTORY:  Past Medical History:  Diagnosis Date  . DVT (deep venous thrombosis) (HCC)    left brachial vein DVT 01/29/16  . Dysphagia   . Hiccups   . High blood pressure   . History of kidney stones   . Hypertension   . Myotonic muscular  dystrophy (HCC)      PERTINENT MEDICATIONS:  Outpatient Encounter Medications as of 11/20/2018  Medication Sig  . aspirin EC 81 MG tablet Take 1 tablet (81 mg total) by mouth daily.  Marland Kitchen atorvastatin (LIPITOR) 40 MG tablet Take 40 mg by mouth every evening.  . benzonatate (TESSALON) 100 MG capsule Take 100 mg by mouth 3 (three) times daily.  . famotidine (PEPCID) 20 MG tablet Take 20 mg by mouth 2 (two) times daily.  . fenofibrate (TRICOR) 145 MG tablet Take 145 mg by mouth daily.  Marland Kitchen guaiFENesin (MUCINEX) 600 MG 12 hr tablet Take 600 mg by mouth every 12 (twelve) hours.  Marland Kitchen HYDROcodone-acetaminophen (NORCO/VICODIN) 5-325 MG tablet Take 1 tablet by mouth every 6 (six) hours as needed for moderate pain.  . Magnesium 400 MG TABS Take by mouth.  . magnesium oxide (MAG-OX) 400 MG tablet Take 1 tablet (400 mg total) by mouth daily.  . methocarbamol (ROBAXIN) 500 MG tablet Take 1 tablet (500 mg total) by mouth 2 (two) times daily as needed for muscle spasms.  . mirtazapine (REMERON) 15 MG tablet Take 15 mg by mouth at bedtime.  . multivitamin (ONE-A-DAY MEN'S) TABS tablet Take 1 tablet by mouth daily.  . potassium chloride SA (K-DUR,KLOR-CON) 20 MEQ tablet Take 2 tablets (40 mEq total) by mouth every Monday, Wednesday, and Friday.  . tamsulosin (FLOMAX) 0.4 MG CAPS capsule Take 1 capsule (0.4 mg total) by mouth daily after breakfast.  . vitamin B-12 (CYANOCOBALAMIN) 1000 MCG tablet Take 1,000 mcg by mouth daily.   No facility-administered encounter medications on file as of 11/20/2018.     PHYSICAL EXAM:  General: NAD, frail appearing, thin middle aged male in bed Cardiovascular: regular rate and rhythm Pulmonary: clear ant fields, diminished breath sounds in bases Abdomen: soft, nontender, + bowel sounds GU: no suprapubic tenderness Extremities: decreased muscular mass Skin: exposed skin is intact Neurological: somnolent but arouses easily  Margaretha Sheffield, NP-C

## 2018-11-22 ENCOUNTER — Non-Acute Institutional Stay: Payer: Medicaid Other | Admitting: Internal Medicine

## 2018-11-22 DIAGNOSIS — Z515 Encounter for palliative care: Secondary | ICD-10-CM

## 2018-11-23 NOTE — Progress Notes (Signed)
[facility] Telephone: [phone] Fax: [phone]  PATIENT NAME: Malik [name] DOB: November 10, 1965 MRN: [id number]  PRIMARY CARE PROVIDER:   Lovena Neighbours, MD  REFERRING PROVIDER:  Dr. Freddi Starr, NP RESPONSIBLE PARTY:  Self, sister and aunt Vladislav Wetzell    RECOMMENDATIONS and PLAN:  1. Palliative Care encounter Z51.5  -Weakness related to myopathies from muscular dystrophy and poor nutrition.  Expect additional decline as disease progresses.  ROM exercises and supportive care for all ADLs.  -Loss of appetite complicated by dysphagia and weakness. Continue pureed diet.  Declined use of protein supplements but is willing to try vanilla Boost(family will provide supply).  Comfort foods.  Aspiration prevention discussed(high risk)  -Depression: Chronic and related to overall health status.  Pt has been refusing meds and not getting benefit of prescribed antidepressant.  Pt agrees to take crushed meds at mid day.  -Chronic leg pain: Related to severe PAD.  Currently controlled with Hydrocodone.  No surgical treatment plans.  -Constipation: Related to chronic narcotic use, poor hydration and  Immobility.  Improve hydration with thickener. Add Senna BID and Dulcolax prn.  MOM 26ml PO now.    -Advanced Care Planning: Joint meeting with patient, sister and aunt at pt bedside. Pt agreed to restart taking meds at noon as well as make attempts to increase nutrition.  He does not want aggressive treatments at this time and desires to keep code status at DNAR/DNI.  Explanation of Hospice Care provided to pt and family and he is in agreement to receiving an evaluation from AV nurse. Family is in support of pt's decision.  Discussion related to pt status and planning was had with [name], NP and Dr. Kirt Boys.  Hospice referral made.  I spent 115 minutes providing this consultation,  from 9:15am to 11:10am at [facility]. More than 50% of the time in this  consultation was spent coordinating communication with pt, family, NP, SW clinical staff.   HISTORY OF PRESENT ILLNESS: Follow-up with  Malik Montgomery Sheltonsince admission to snf from hospital due to increased weakness/Myotonic muscular dystrophy, post bilat PNA and dysphagia.  Staff reports that pt continues to refuse medications and eats very little.  Increased sleeping.  He requires total care and is incontinent of B&B.   CODE STATUS: DNR/DNI  PPS: 20% HOSPICE ELIGIBILITY/DIAGNOSIS: YES, anorexia with protein calorie malnutrition, congenital muscular dystrophy  PAST MEDICAL HISTORY:  Past Medical History:  Diagnosis Date  . DVT (deep venous thrombosis) (HCC)    left brachial vein DVT 01/29/16  . Dysphagia   . Hiccups   . High blood pressure   . History of kidney stones   . Hypertension   . 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HCC)      PHYSICAL EXAM:   General: NAD, Chronically ill and frail appearing, cachectic Cardiovascular: regular rate and rhythm Pulmonary: unlabored repirations Abdomen: soft, nontender, + bowel sounds Extremities: decreased muscle mass with very limited use of upper extremities Skin:exposed skin is intact Neurological: gross overall weakness. Alert and oriented x 3 Psych:  Flat affect. No reports of desire for self harm. Mood lifted with discussion of youth  Margaretha Sheffield, NP

## 2019-05-01 ENCOUNTER — Other Ambulatory Visit: Payer: Self-pay

## 2019-05-01 DIAGNOSIS — I779 Disorder of arteries and arterioles, unspecified: Secondary | ICD-10-CM

## 2019-05-10 ENCOUNTER — Encounter (HOSPITAL_COMMUNITY): Payer: Medicaid Other

## 2019-05-10 ENCOUNTER — Encounter: Payer: Self-pay | Admitting: Family

## 2019-05-10 ENCOUNTER — Ambulatory Visit: Payer: Medicaid Other | Admitting: Family

## 2019-08-27 NOTE — Progress Notes (Signed)
[facility] Consult Note Telephone: [phone]  Fax: [phone]  PATIENT NAME: [name] DOB: [birth year] MRN: [id number]  PRIMARY CARE PROVIDER:   Matilde Haymaker, MD  REFERRING PROVIDER:  Dr. Elijio Miles  RESPONSIBLE PARTY:   Self and sister     RECOMMENDATIONS and PLAN:  Palliative Care Encounter Z51.5  1. Advance care planning:  Discussed palliative and hospice care services. Reviewed advanced directives.  Pt. Confirms DNAR/DNI status along with no tube feedings in the future.  He has no current decisions in reference to remainder to advanced planning.  We will continue conversation during next visit.   2.   Protein calorie malnutrition:  Declining as he refuses meals intermittently.  Offer nutritional supplements TID (house selection), dietary consult if not already completed. Continue Remeron q HS.(Complicated by pt's refusal to take medications). Support given  3.  Weakness:  Chronic and exacerbated by muscular dystropy.  No thoughts of great improvement.  Supportive care.  Fall risk prevention.  Improve nutritional intake and hydration.   4. Foot pain:  Hx of vascular disease.  Pending appt with vascular provider for evaluation of legs.  Add to Podiatry list for nail trimming which could be adding to foot pain.  I spent 30 minutes providing this consultation,  from 1330 to 1400. More than 50% of the time in this consultation was spent coordinating communication with pt, clinical staff and SW.   HISTORY OF PRESENT ILLNESS:  Malik [name] is a 53 y.o. year old male with multiple medical problems including myotonic muscular dystrophy, dysphagia, malnutrition and non-compliance with medical treatment.  Staff reports that pt only intermittently eats meals and takes prescribed medications and has become weaker.  He sleeps numerous hours throughout the day. He requires total care, is incontinent of B&B and is non-ambulatory.   Palliative Care was asked to help address goals of care.   CODE STATUS: DNAR  PPS: 30% HOSPICE ELIGIBILITY/DIAGNOSIS: TBD  PAST MEDICAL HISTORY:  Past Medical History:  Diagnosis Date  . DVT (deep venous thrombosis) (HCC)    left brachial vein DVT 01/29/16  . Dysphagia   . Hiccups   . High blood pressure   . History of kidney stones   . Hypertension   . 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[place])      PERTINENT MEDICATIONS:  Outpatient Encounter Medications as of 10/27/2018  Medication Sig  . aspirin EC 81 MG tablet Take 1 tablet (81 mg total) by mouth daily.  Marland Kitchen atorvastatin (LIPITOR) 40 MG tablet Take 40 mg by mouth every evening.  . benzonatate (TESSALON) 100 MG capsule Take 100 mg by mouth 3 (three) times daily.  . famotidine (PEPCID) 20 MG tablet Take 20 mg by mouth 2 (two) times daily.  . fenofibrate (TRICOR) 145 MG tablet Take 145 mg by mouth daily.  Marland Kitchen guaiFENesin (MUCINEX) 600 MG 12 hr tablet Take 600 mg by mouth every 12 (twelve) hours.  . magnesium oxide (MAG-OX) 400 MG tablet Take 1 tablet (400 mg total) by mouth daily.  . methocarbamol (ROBAXIN) 500 MG tablet Take 1 tablet (500 mg total) by mouth 2 (two) times daily as needed for muscle spasms.  . mirtazapine (REMERON) 15 MG tablet Take 15 mg by mouth at bedtime.  . multivitamin (ONE-A-DAY MEN'S) TABS tablet Take 1 tablet by mouth daily.  . potassium chloride SA (K-DUR,KLOR-CON) 20 MEQ tablet Take 2 tablets (40 mEq total) by mouth every Monday, Wednesday, and Friday.  . tamsulosin (FLOMAX) 0.4  MG CAPS capsule Take 1 capsule (0.4 mg total) by mouth daily after breakfast.  . vitamin B-12 (CYANOCOBALAMIN) 1000 MCG tablet Take 1,000 mcg by mouth daily.  . [DISCONTINUED] HYDROcodone-acetaminophen (NORCO/VICODIN) 5-325 MG tablet Take 1 tablet by mouth every 4 (four) hours as needed for moderate pain.   No facility-administered encounter medications on file as of 10/27/2018.     PHYSICAL EXAM:   General: NAD, frail appearing,  thin Cardiovascular: regular rate and rhythm Pulmonary: diminished breath sounds in bases Abdomen: soft, flat, nontender Extremities: no edema, diminished muscle tone Skin: exposed skin is intact. Long/thick toenails Neurological: A&O x3.  Generalized weakness  Margaretha Sheffield, NP-C

## 2019-12-08 IMAGING — CT CT RENAL STONE PROTOCOL
2 of 4 series · 15 of 46 positions shown, 17 images · non-contrast
Comparison: CT of the abdomen and pelvis from 01/24/2018

CLINICAL DATA: Acute onset of right-sided abdominal pain and
diarrhea.

EXAM:
CT ABDOMEN AND PELVIS WITHOUT CONTRAST
TECHNIQUE: Multidetector CT imaging of the abdomen and pelvis was performed
following the standard protocol without IV contrast.

[Series 2: axial st · axial · 0.70mm/px · z∈[+927,+1352]mm · 12 of 96 slices shown, 14 images]
[im 6/96  soft-tissue]
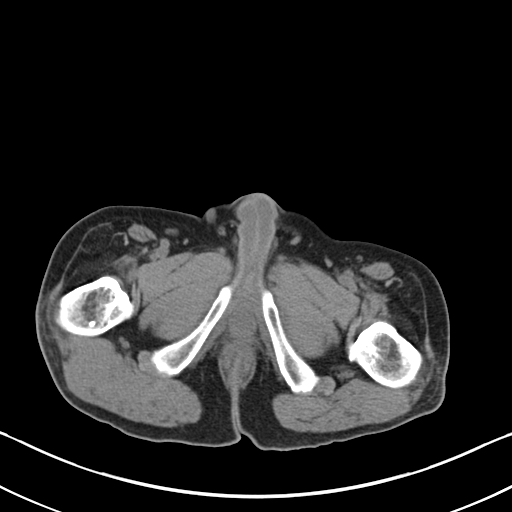
[im 6/96  bone]
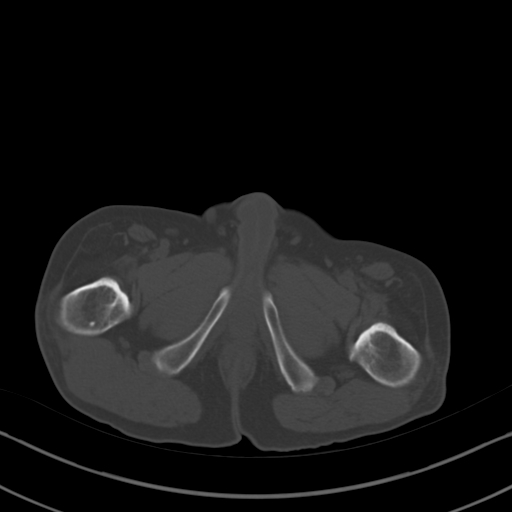
[im 16/96  soft-tissue]
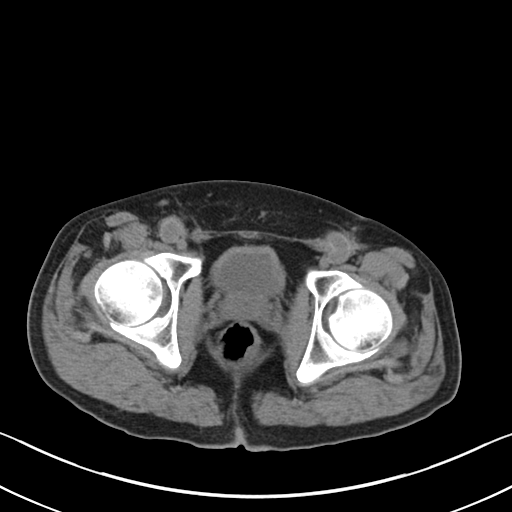
[im 21/96  soft-tissue]
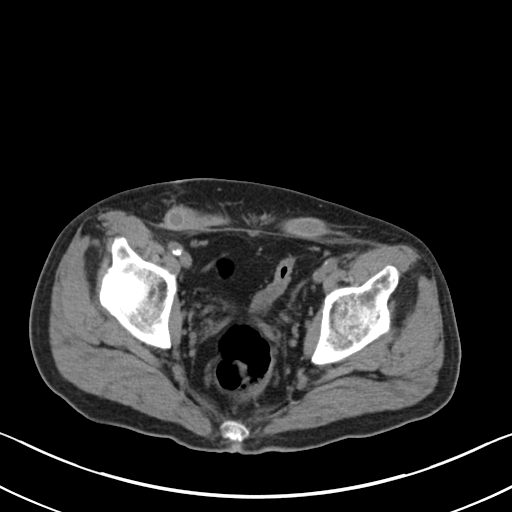
[im 31/96  soft-tissue]
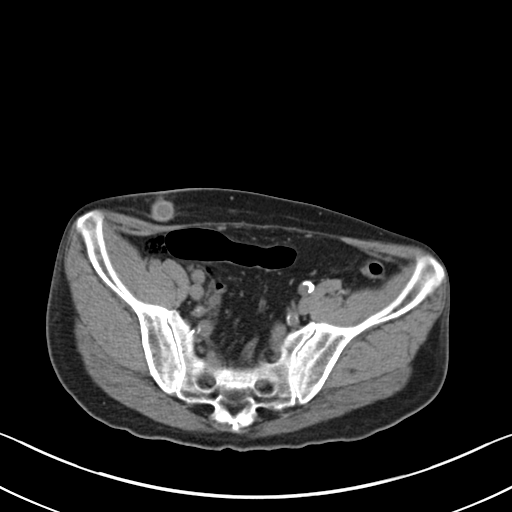
[im 36/96  soft-tissue]
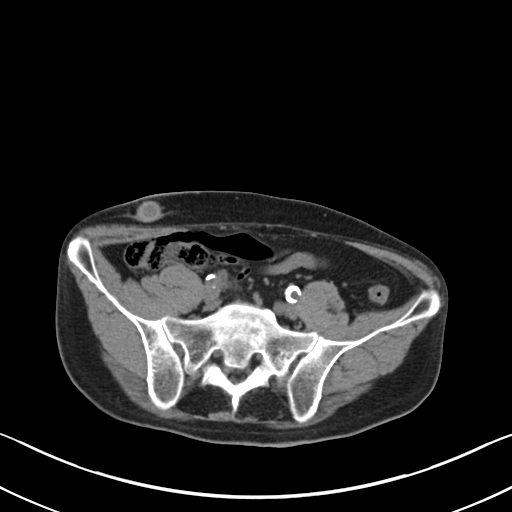
[im 46/96  soft-tissue]
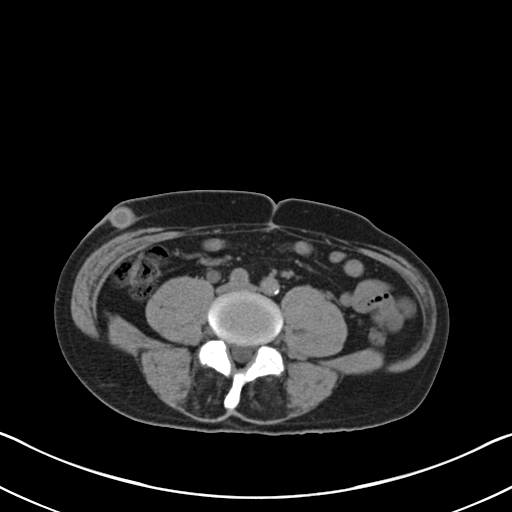
[im 51/96  soft-tissue]
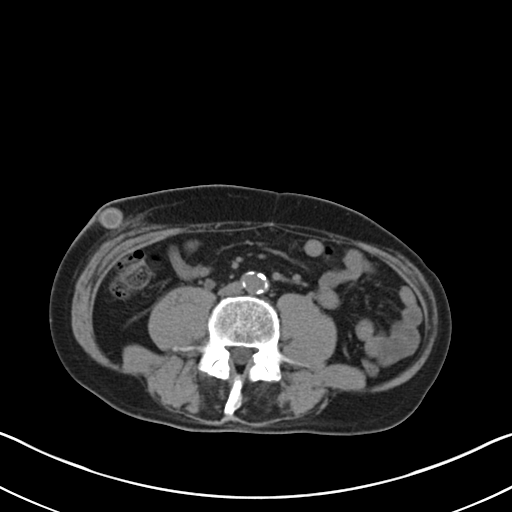
[im 61/96  soft-tissue]
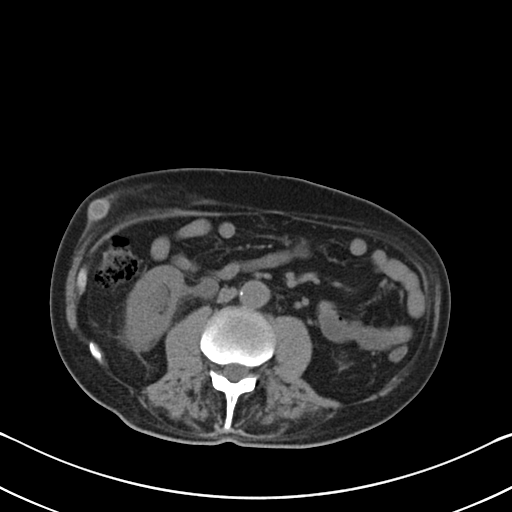
[im 66/96  soft-tissue]
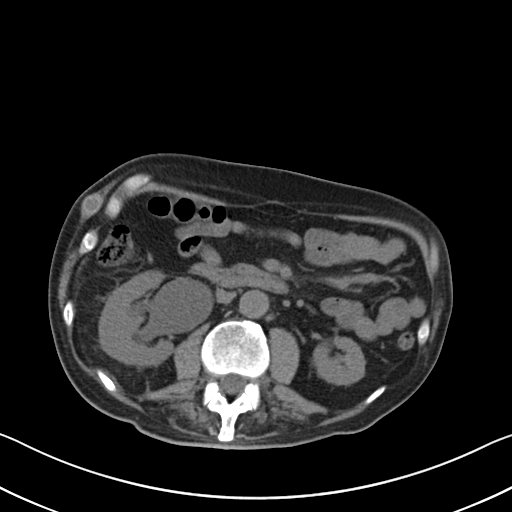
[im 66/96  bone]
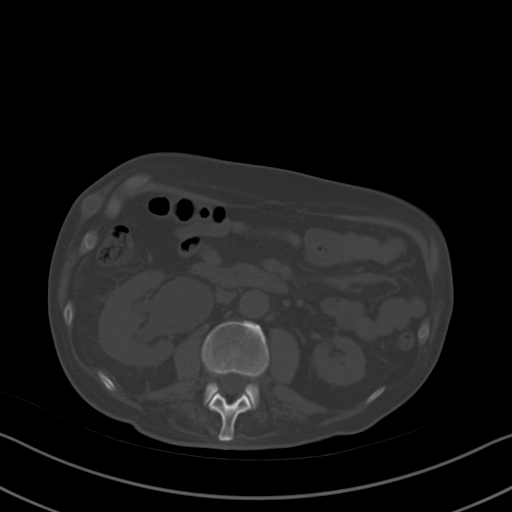
[im 76/96  soft-tissue]
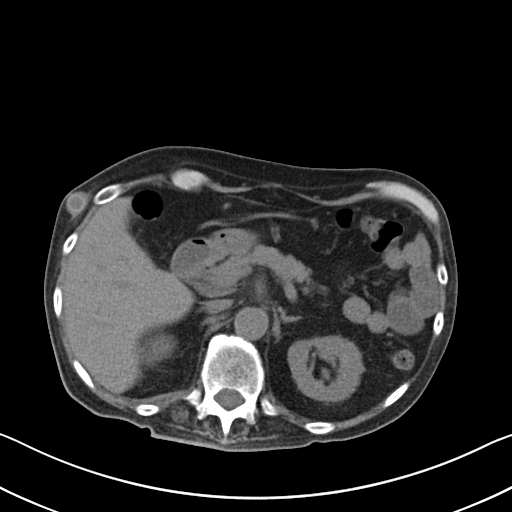
[im 81/96  soft-tissue]
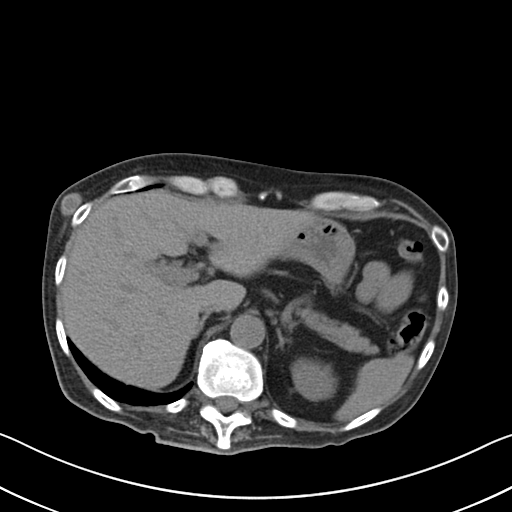
[im 91/96  soft-tissue]
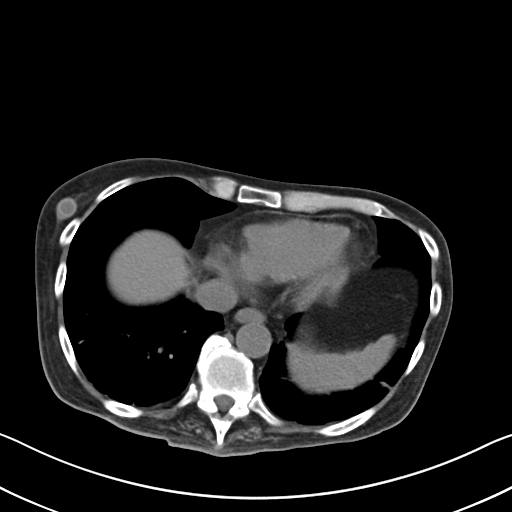

[Series 5: coronal · coronal · 0.67mm/px · 3 of 104 slices shown]
[im 35/104  soft-tissue]
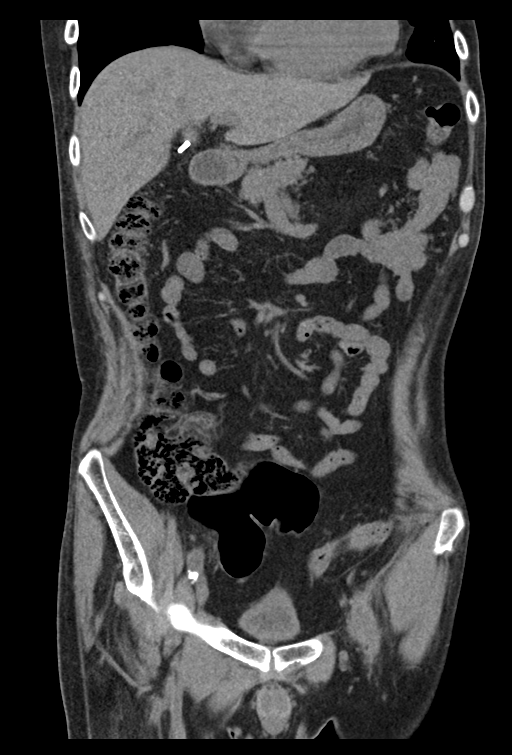
[im 46/104  soft-tissue]
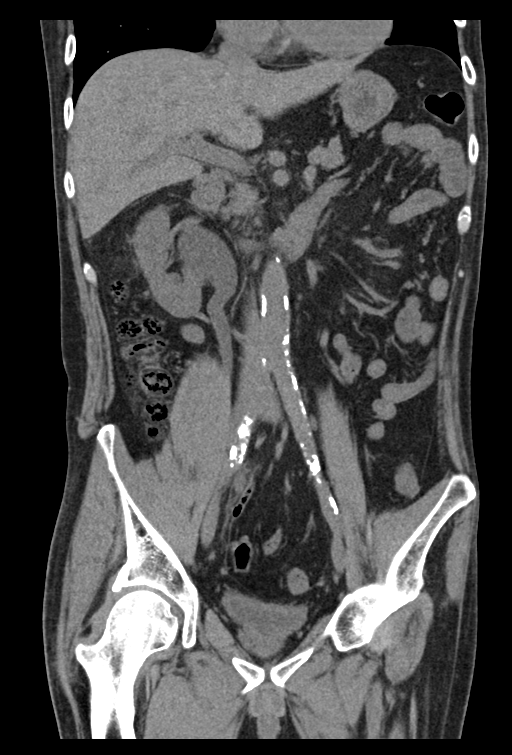
[im 58/104  soft-tissue]
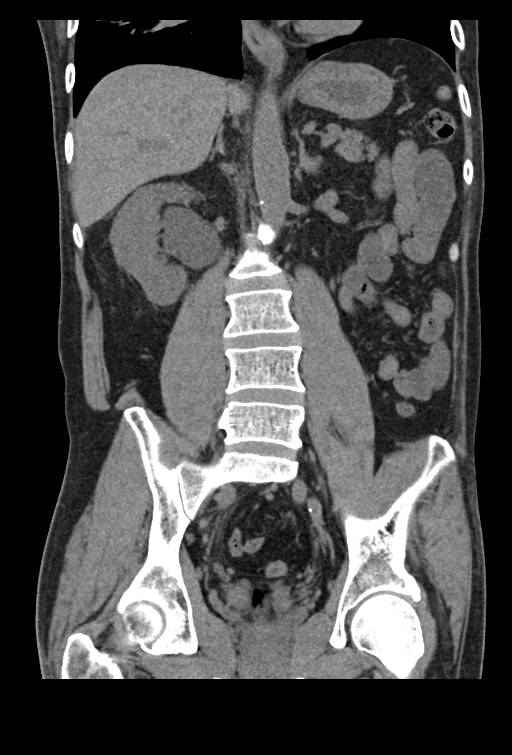

[15 of 46 positions shown; findings below may reference images not displayed]

FINDINGS: Lower chest: Bronchiectasis is noted at the lung bases, with mild
bibasilar atelectasis or scarring. The visualized portions of the
mediastinum are unremarkable.

Hepatobiliary: The liver is unremarkable in appearance. The patient
is status post cholecystectomy, with clips noted at the gallbladder
fossa. The common bile duct remains normal in caliber.

Pancreas: The pancreas is within normal limits.

Spleen: The spleen is unremarkable in appearance.

Adrenals/Urinary Tract: The adrenal glands are unremarkable in
appearance.

Moderate to severe right-sided hydronephrosis is noted, with
prominence of the right ureter along much of its course. An
obstructing 7 x 5 mm stone is noted at the distal right ureter, 4 cm
above the right vesicoureteral junction.

Nonspecific bilateral perinephric stranding is noted bilaterally. No
nonobstructing renal stones are identified. A small left renal cyst
is noted.

Stomach/Bowel: The stomach is unremarkable in appearance. The small
bowel is within normal limits. The appendix is normal in caliber,
without evidence of appendicitis. The colon is unremarkable in
appearance.

Vascular/Lymphatic: Scattered calcification is seen along the
abdominal aorta and its branches. The abdominal aorta is otherwise
grossly unremarkable. The inferior vena cava is grossly
unremarkable. No retroperitoneal lymphadenopathy is seen. No pelvic
sidewall lymphadenopathy is identified.

Minimal chronic stranding is noted about the patient's
axillobifemoral bypass graft.

Reproductive: The bladder is decompressed and not well assessed. The
prostate remains normal in size.

Other: No additional soft tissue abnormalities are seen.

Musculoskeletal: No acute osseous abnormalities are identified. The
visualized musculature is unremarkable in appearance.
IMPRESSION: 1. Moderate to severe right-sided hydronephrosis, with an
obstructing 7 x 5 mm stone at the distal right ureter, 4 cm above
the right vesicoureteral junction.
2. Bronchiectasis at the lung bases, with mild bibasilar atelectasis
or scarring.
3. Small left renal cyst.

Aortic Atherosclerosis ([id number]-9N0.0).

## 2020-11-21 ENCOUNTER — Encounter (HOSPITAL_COMMUNITY): Payer: Self-pay | Admitting: Emergency Medicine

## 2020-11-21 ENCOUNTER — Other Ambulatory Visit: Payer: Self-pay

## 2020-11-21 ENCOUNTER — Inpatient Hospital Stay (HOSPITAL_COMMUNITY)
Admission: EM | Admit: 2020-11-21 | Discharge: 2020-12-02 | DRG: 177 | Disposition: E | Source: Skilled Nursing Facility | Attending: Family Medicine | Admitting: Family Medicine

## 2020-11-21 ENCOUNTER — Emergency Department (HOSPITAL_COMMUNITY)

## 2020-11-21 DIAGNOSIS — U071 COVID-19: Principal | ICD-10-CM | POA: Diagnosis present

## 2020-11-21 DIAGNOSIS — I10 Essential (primary) hypertension: Secondary | ICD-10-CM | POA: Diagnosis present

## 2020-11-21 DIAGNOSIS — Z820 Family history of epilepsy and other diseases of the nervous system: Secondary | ICD-10-CM

## 2020-11-21 DIAGNOSIS — Z803 Family history of malignant neoplasm of breast: Secondary | ICD-10-CM

## 2020-11-21 DIAGNOSIS — J1282 Pneumonia due to coronavirus disease 2019: Secondary | ICD-10-CM | POA: Diagnosis present

## 2020-11-21 DIAGNOSIS — Z515 Encounter for palliative care: Secondary | ICD-10-CM

## 2020-11-21 DIAGNOSIS — R54 Age-related physical debility: Secondary | ICD-10-CM | POA: Diagnosis present

## 2020-11-21 DIAGNOSIS — Z87442 Personal history of urinary calculi: Secondary | ICD-10-CM

## 2020-11-21 DIAGNOSIS — Z79899 Other long term (current) drug therapy: Secondary | ICD-10-CM

## 2020-11-21 DIAGNOSIS — J189 Pneumonia, unspecified organism: Secondary | ICD-10-CM | POA: Diagnosis not present

## 2020-11-21 DIAGNOSIS — I4892 Unspecified atrial flutter: Secondary | ICD-10-CM | POA: Diagnosis present

## 2020-11-21 DIAGNOSIS — I4891 Unspecified atrial fibrillation: Secondary | ICD-10-CM | POA: Diagnosis present

## 2020-11-21 DIAGNOSIS — E785 Hyperlipidemia, unspecified: Secondary | ICD-10-CM | POA: Diagnosis present

## 2020-11-21 DIAGNOSIS — E870 Hyperosmolality and hypernatremia: Secondary | ICD-10-CM | POA: Diagnosis present

## 2020-11-21 DIAGNOSIS — I739 Peripheral vascular disease, unspecified: Secondary | ICD-10-CM | POA: Diagnosis present

## 2020-11-21 DIAGNOSIS — G7111 Myotonic muscular dystrophy: Secondary | ICD-10-CM | POA: Diagnosis present

## 2020-11-21 DIAGNOSIS — Z66 Do not resuscitate: Secondary | ICD-10-CM | POA: Diagnosis present

## 2020-11-21 DIAGNOSIS — Z7982 Long term (current) use of aspirin: Secondary | ICD-10-CM

## 2020-11-21 DIAGNOSIS — Z833 Family history of diabetes mellitus: Secondary | ICD-10-CM

## 2020-11-21 DIAGNOSIS — E876 Hypokalemia: Secondary | ICD-10-CM | POA: Diagnosis present

## 2020-11-21 DIAGNOSIS — Z86718 Personal history of other venous thrombosis and embolism: Secondary | ICD-10-CM

## 2020-11-21 DIAGNOSIS — R0602 Shortness of breath: Secondary | ICD-10-CM

## 2020-11-21 DIAGNOSIS — N400 Enlarged prostate without lower urinary tract symptoms: Secondary | ICD-10-CM | POA: Diagnosis present

## 2020-11-21 DIAGNOSIS — J9601 Acute respiratory failure with hypoxia: Secondary | ICD-10-CM | POA: Diagnosis present

## 2020-11-21 DIAGNOSIS — J690 Pneumonitis due to inhalation of food and vomit: Secondary | ICD-10-CM | POA: Diagnosis present

## 2020-11-21 DIAGNOSIS — R1312 Dysphagia, oropharyngeal phase: Secondary | ICD-10-CM | POA: Diagnosis present

## 2020-11-21 DIAGNOSIS — J90 Pleural effusion, not elsewhere classified: Secondary | ICD-10-CM | POA: Diagnosis present

## 2020-11-21 DIAGNOSIS — M6281 Muscle weakness (generalized): Secondary | ICD-10-CM | POA: Diagnosis present

## 2020-11-21 DIAGNOSIS — Z87891 Personal history of nicotine dependence: Secondary | ICD-10-CM

## 2020-11-21 DIAGNOSIS — Z825 Family history of asthma and other chronic lower respiratory diseases: Secondary | ICD-10-CM

## 2020-11-21 DIAGNOSIS — Z9049 Acquired absence of other specified parts of digestive tract: Secondary | ICD-10-CM

## 2020-11-21 DIAGNOSIS — I959 Hypotension, unspecified: Secondary | ICD-10-CM | POA: Diagnosis present

## 2020-11-21 NOTE — ED Provider Notes (Signed)
[facility]-EMERGENCY DEPT Provider Note   CSN: [id number] Arrival date & time: 09-Dec-2020  2307   History Chief Complaint  Patient presents with  . Aspiration    Malik [name]    Malik [name] is a 55 y.o. male.  The history is provided by the EMS personnel. The history is limited by the condition of the patient (Patient is nonverbal).  He has history of hypertension, myotonic muscular dystrophy, DVT, peripheral vascular disease and was sent here from skilled nursing facility because of aspiration.  He is a hospice patient apparently aspirated well he will be on a [name].  He was found to be cyanotic and hypoxic with oxygen saturation of 55%.  He was placed on a nonrebreather mask with oxygen saturation improving to 93%.  Past Medical History:  Diagnosis Date  . DVT (deep venous thrombosis) (HCC)    left brachial vein DVT 01/29/16  . Dysphagia   . Hiccups   . High blood pressure   . History of kidney stones   . Hypertension   . Myotonic muscular dystrophy [facility])     Patient Active Problem List   Diagnosis Date Noted  . Hypoxia 10/09/2018  . Abdominal pain 01/06/2018  . Pressure injury of skin 01/06/2018  . Ureteral stone with hydronephrosis 01/06/2018  . Hypernatremia 01/06/2018  . Bilateral ureteral calculi   . PAD (peripheral artery disease) (HCC) 06/07/2016  . Leg pain, bilateral 06/02/2016  . Esophageal obstruction due to food impaction   . DVT of upper extremity (deep vein thrombosis) (HCC) 01/29/2016  . Residence in long term care facility 03/04/2014  . EKG abnormality 11/09/2013  . Community acquired pneumonia 11/08/2013  . Palliative care encounter 06/22/2013  . Epidermal cyst 06/22/2013  . Skin lesion of hand 06/22/2013  . 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 Hypertension, benign 01/20/2012  . Unspecified vitamin D deficiency 08/05/2011  . Vitamin B 12 deficiency 07/14/2011  . 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 Muscle weakness  (generalized) 04/16/2011  . Weight finding 04/16/2011  . History of tobacco use 04/16/2011    Past Surgical History:  Procedure Laterality Date  . AXILLARY-FEMORAL BYPASS GRAFT Right 03/07/2017   Procedure: RIGHT AXILLA-BIFEMORAL BYPASS GRAFT USING HEMASHIELD GOLD VASCULAR GRAFT;  Surgeon: [name], MD;  Location: [facility] OR;  Service: Vascular;  Laterality: Right;  . CHOLECYSTECTOMY    . ENDARTERECTOMY FEMORAL Left 03/07/2017   Procedure: ENDARTERECTOMY LEFT FEMORAL ARTERY ;  Surgeon: [name], MD;  Location: [facility] OR;  Service: Vascular;  Laterality: Left;  . ESOPHAGOGASTRODUODENOSCOPY N/A 05/10/2016   Procedure: ESOPHAGOGASTRODUODENOSCOPY (EGD);  Surgeon: [name], MD;  Location: [facility];  Service: Endoscopy;  Laterality: N/A;  Please discuss sedation possibilities with Dr. [name]  . ESOPHAGOGASTRODUODENOSCOPY N/A 08/29/2017   Procedure: ESOPHAGOGASTRODUODENOSCOPY (EGD);  Surgeon: [name], MD;  Location: [facility];  Service: Endoscopy;  Laterality: N/A;  . MULTIPLE TOOTH EXTRACTIONS  2007   Pt had all teeth pulled, has not yet gotten dentures.   Marland Kitchen PATCH ANGIOPLASTY Left 03/07/2017   Procedure: PATCH ANGIOPLASTY LEFT FEMORAL ARTERY USING XENOSURE BIOLOGIC PATCH;  Surgeon: [name], MD;  Location: [facility] OR;  Service: Vascular;  Laterality: Left;       Family History  Problem Relation Age of Onset  . COPD Mother   . Diabetes Mother   . Breast cancer Mother   . Cancer Father   . Cancer Sister   . Muscular dystrophy Sister     Social History  Tobacco Use  . Smoking status: Former Smoker    Packs/day: 0.30    Types: Cigarettes    Start date: 11/02/1983    Quit date: 08/01/2017    Years since quitting: 3.3  . Smokeless tobacco: Never Used  Vaping Use  . Vaping Use: Never used  Substance Use Topics  . Alcohol use: No  . Drug use: No    Types: Marijuana    Comment: Pt smokes MJ ocassinally.  Denies ever using IV drugs.     Home  Medications Prior to Admission medications   Medication Sig Start Date End Date Taking? Authorizing Provider  aspirin EC 81 MG tablet Take 1 tablet (81 mg total) by mouth daily. 01/27/18   [name], MD  atorvastatin (LIPITOR) 40 MG tablet Take 40 mg by mouth every evening.    [provider]  benzonatate (TESSALON) 100 MG capsule Take 100 mg by mouth 3 (three) times daily.    [provider]  famotidine (PEPCID) 20 MG tablet Take 20 mg by mouth 2 (two) times daily.    [provider]  fenofibrate (TRICOR) 145 MG tablet Take 145 mg by mouth daily.    [provider]  guaiFENesin (MUCINEX) 600 MG 12 hr tablet Take 600 mg by mouth every 12 (twelve) hours.    [provider]  HYDROcodone-acetaminophen (NORCO/VICODIN) 5-325 MG tablet Take 1 tablet by mouth every 6 (six) hours as needed for moderate pain.    [provider]  Magnesium 400 MG TABS Take by mouth.    [provider]  magnesium oxide (MAG-OX) 400 MG tablet Take 1 tablet (400 mg total) by mouth daily. 01/07/18   [name], MD  methocarbamol (ROBAXIN) 500 MG tablet Take 1 tablet (500 mg total) by mouth 2 (two) times daily as needed for muscle spasms. 01/02/18   [name], MD  mirtazapine (REMERON) 15 MG tablet Take 15 mg by mouth at bedtime.    [provider]  multivitamin (ONE-A-DAY MEN'S) TABS tablet Take 1 tablet by mouth daily. 01/27/18   [name], MD  potassium chloride SA (K-DUR,KLOR-CON) 20 MEQ tablet Take 2 tablets (40 mEq total) by mouth every Monday, Wednesday, and Friday. 01/09/18   [name], MD  tamsulosin [facility]) 0.4 MG CAPS capsule Take 1 capsule (0.4 mg total) by mouth daily after breakfast. 02/18/18   Leaphart, Lynann Beaver, PA-C  vitamin B-12 (CYANOCOBALAMIN) 1000 MCG tablet Take 1,000 mcg by mouth daily.    [provider]    Allergies    Patient has no known allergies.  Review of Systems   Review of Systems  Unable to  perform ROS: Patient nonverbal    Physical Exam Updated Vital Signs BP 96/78 (BP Location: Right Arm)   Pulse (!) 106   Temp (!) 97 F (36.1 C)   SpO2 95%   Physical Exam Vitals and nursing note reviewed.   55 year old male, resting comfortably and in no acute distress. Vital signs are significant for rapid heart rate. Oxygen saturation is 95%, which is normal, he is on a nonrebreather oxygen mask. Head is normocephalic and atraumatic.  Pupils are 3 mm and reactive. Oropharynx is clear. Neck is nontender and supple without adenopathy or JVD. Back is nontender and there is no CVA tenderness. Lungs have coarse rhonchi throughout. Chest is nontender. Heart has regular rate and rhythm without murmur. Abdomen is soft, flat, nontender without masses or hepatosplenomegaly and peristalsis is normoactive. Extremities have no  cyanosis or edema. Skin is warm and dry without rash. Neurologic: Sleeping but does have minimal response to painful stimuli. Generalized weakness.  No focal findings.  ED Results / Procedures / Treatments   Labs (all labs ordered are listed, but only abnormal results are displayed) Labs Reviewed - No data to display  Radiology DG Chest [facility] 1 View  Result Date: 11/22/2020 CLINICAL DATA:  Hypoxia EXAM: PORTABLE CHEST 1 VIEW COMPARISON:  CT chest 10/09/2018 FINDINGS: Lung volumes are small, but are symmetric. There is bibasilar atelectasis or infiltrate present. Small left pleural effusion present. No pneumothorax. Cardiac size within normal limits. Pulmonary vascularity is normal. IMPRESSION: Bibasilar atelectasis or infiltrate.  Small left pleural effusion. Electronically Signed   By: Helyn Numbers MD   On: 11/22/2020 00:02    Procedures Procedures   Medications Ordered in ED Medications  cefTRIAXone (ROCEPHIN) 2 g in sodium chloride 0.9 % 100 mL IVPB (0 g Intravenous Stopped 11/22/20 0106)  lactated ringers bolus 1,000 mL (0 mLs Intravenous Stopped 11/22/20  0335)    ED Course  I have reviewed the triage vital signs and the nursing notes.  Pertinent labs & imaging results that were available during my care of the patient were reviewed by me and considered in my medical decision making (see chart for details).  MDM Rules/Calculators/A&P Hypoxia likely secondary to aspiration.  Will check chest x-ray.  He is maintaining adequate oxygen saturation with supplemental oxygen.  OLD records are reviewed confirming DNR status.  Given DNR status and desire based on paperwork to avoid aggressive therapy, sepsis work-up was not initiated.  X-ray shows bibasilar atelectasis versus infiltrate. In the clinical setting, this likely represents aspiration pneumonia. He is started on IV antibiotics.  He has remained hemodynamically stable.  Case is discussed with Dr. Julian Reil of [facility] hospitalist, who agrees to admit the patient.  Final Clinical Impression(s) / ED Diagnoses Final diagnoses:  Aspiration pneumonia of both lower lobes, unspecified aspiration pneumonia type [facility])    Rx / DC Orders ED Discharge Orders    None       Dione Booze, MD 11/22/20 [phone]

## 2020-11-21 NOTE — ED Triage Notes (Signed)
Patient arrives from [place] as a hospice patient. Patient aspirated on a [name]. Facility found patient cyanotic was a RA sat of 55%. Patient placed on 25L NRB with improvement to 93% 15L NRB with EMS with a RR of 40.

## 2020-11-22 DIAGNOSIS — I10 Essential (primary) hypertension: Secondary | ICD-10-CM | POA: Diagnosis present

## 2020-11-22 DIAGNOSIS — Z9049 Acquired absence of other specified parts of digestive tract: Secondary | ICD-10-CM | POA: Diagnosis not present

## 2020-11-22 DIAGNOSIS — I739 Peripheral vascular disease, unspecified: Secondary | ICD-10-CM | POA: Diagnosis present

## 2020-11-22 DIAGNOSIS — J690 Pneumonitis due to inhalation of food and vomit: Secondary | ICD-10-CM | POA: Diagnosis present

## 2020-11-22 DIAGNOSIS — J90 Pleural effusion, not elsewhere classified: Secondary | ICD-10-CM | POA: Diagnosis present

## 2020-11-22 DIAGNOSIS — J9601 Acute respiratory failure with hypoxia: Secondary | ICD-10-CM | POA: Diagnosis present

## 2020-11-22 DIAGNOSIS — Z515 Encounter for palliative care: Secondary | ICD-10-CM | POA: Diagnosis not present

## 2020-11-22 DIAGNOSIS — M7989 Other specified soft tissue disorders: Secondary | ICD-10-CM | POA: Diagnosis not present

## 2020-11-22 DIAGNOSIS — N400 Enlarged prostate without lower urinary tract symptoms: Secondary | ICD-10-CM | POA: Diagnosis present

## 2020-11-22 DIAGNOSIS — Z7982 Long term (current) use of aspirin: Secondary | ICD-10-CM | POA: Diagnosis not present

## 2020-11-22 DIAGNOSIS — Z833 Family history of diabetes mellitus: Secondary | ICD-10-CM | POA: Diagnosis not present

## 2020-11-22 DIAGNOSIS — U071 COVID-19: Secondary | ICD-10-CM | POA: Diagnosis present

## 2020-11-22 DIAGNOSIS — J189 Pneumonia, unspecified organism: Secondary | ICD-10-CM | POA: Diagnosis present

## 2020-11-22 DIAGNOSIS — R1312 Dysphagia, oropharyngeal phase: Secondary | ICD-10-CM | POA: Diagnosis present

## 2020-11-22 DIAGNOSIS — Z86718 Personal history of other venous thrombosis and embolism: Secondary | ICD-10-CM | POA: Diagnosis not present

## 2020-11-22 DIAGNOSIS — Z87442 Personal history of urinary calculi: Secondary | ICD-10-CM | POA: Diagnosis not present

## 2020-11-22 DIAGNOSIS — E870 Hyperosmolality and hypernatremia: Secondary | ICD-10-CM | POA: Diagnosis present

## 2020-11-22 DIAGNOSIS — Z66 Do not resuscitate: Secondary | ICD-10-CM | POA: Diagnosis present

## 2020-11-22 DIAGNOSIS — Z803 Family history of malignant neoplasm of breast: Secondary | ICD-10-CM | POA: Diagnosis not present

## 2020-11-22 DIAGNOSIS — Z87891 Personal history of nicotine dependence: Secondary | ICD-10-CM | POA: Diagnosis not present

## 2020-11-22 DIAGNOSIS — J1282 Pneumonia due to coronavirus disease 2019: Secondary | ICD-10-CM | POA: Diagnosis present

## 2020-11-22 DIAGNOSIS — Z820 Family history of epilepsy and other diseases of the nervous system: Secondary | ICD-10-CM | POA: Diagnosis not present

## 2020-11-22 DIAGNOSIS — G7111 Myotonic muscular dystrophy: Secondary | ICD-10-CM | POA: Diagnosis present

## 2020-11-22 DIAGNOSIS — I4892 Unspecified atrial flutter: Secondary | ICD-10-CM | POA: Diagnosis present

## 2020-11-22 DIAGNOSIS — Z79899 Other long term (current) drug therapy: Secondary | ICD-10-CM | POA: Diagnosis not present

## 2020-11-22 DIAGNOSIS — Z825 Family history of asthma and other chronic lower respiratory diseases: Secondary | ICD-10-CM | POA: Diagnosis not present

## 2020-11-22 LAB — BASIC METABOLIC PANEL
Anion gap: 9 (ref 5–15)
BUN: 10 mg/dL (ref 6–20)
CO2: 28 mmol/L (ref 22–32)
Calcium: 8.7 mg/dL — ABNORMAL LOW (ref 8.9–10.3)
Chloride: 105 mmol/L (ref 98–111)
Creatinine, Ser: 0.76 mg/dL (ref 0.61–1.24)
GFR, Estimated: >60 mL/min (ref >60)
Glucose, Bld: 179 mg/dL — ABNORMAL HIGH (ref 70–99)
Potassium: 4 mmol/L (ref 3.5–5.1)
Sodium: 142 mmol/L (ref 135–145)

## 2020-11-22 LAB — CBC WITH DIFFERENTIAL/PLATELET
Abs Immature Granulocytes: 0.03 10*3/uL (ref 0.00–0.07)
Basophils Absolute: 0 10*3/uL (ref 0.0–0.1)
Basophils Relative: 0 %
Eosinophils Absolute: 0 10*3/uL (ref 0.0–0.5)
Eosinophils Relative: 0 %
HCT: 58.9 % — ABNORMAL HIGH (ref 39.0–52.0)
Hemoglobin: 18.4 g/dL — ABNORMAL HIGH (ref 13.0–17.0)
Immature Granulocytes: 0 %
Lymphocytes Relative: 5 %
Lymphs Abs: 0.5 10*3/uL — ABNORMAL LOW (ref 0.7–4.0)
MCH: 30.9 pg (ref 26.0–34.0)
MCHC: 31.2 g/dL (ref 30.0–36.0)
MCV: 99 fL (ref 80.0–100.0)
Monocytes Absolute: 0.5 10*3/uL (ref 0.1–1.0)
Monocytes Relative: 6 %
Neutro Abs: 7.6 10*3/uL (ref 1.7–7.7)
Neutrophils Relative %: 89 %
Platelets: 163 10*3/uL (ref 150–400)
RBC: 5.95 MIL/uL — ABNORMAL HIGH (ref 4.22–5.81)
RDW: 15.5 % (ref 11.5–15.5)
WBC: 8.7 10*3/uL (ref 4.0–10.5)
nRBC: 0 % (ref 0.0–0.2)

## 2020-11-22 LAB — SARS CORONAVIRUS 2 (TAT 6-24 HRS): SARS Coronavirus 2: POSITIVE — AB

## 2020-11-22 LAB — CBG MONITORING, ED: Glucose-Capillary: 176 mg/dL — ABNORMAL HIGH (ref 70–99)

## 2020-11-22 LAB — HIV ANTIBODY (ROUTINE TESTING W REFLEX): HIV Screen 4th Generation wRfx: NONREACTIVE

## 2020-11-22 MED ORDER — ALBUTEROL SULFATE (2.5 MG/3ML) 0.083% IN NEBU
2.5000 mg | INHALATION_SOLUTION | Freq: Four times a day (QID) | RESPIRATORY_TRACT | Status: DC
  Administered 2020-11-22: 2.5 mg via RESPIRATORY_TRACT
  Filled 2020-11-22: qty 3

## 2020-11-22 MED ORDER — SODIUM CHLORIDE 0.9 % IV SOLN
100.0000 mg | Freq: Every day | INTRAVENOUS | Status: AC
  Administered 2020-11-23 – 2020-11-26 (×4): 100 mg via INTRAVENOUS
  Filled 2020-11-22 (×4): qty 20

## 2020-11-22 MED ORDER — SODIUM CHLORIDE 0.9 % IV SOLN
2.0000 g | Freq: Once | INTRAVENOUS | Status: AC
  Administered 2020-11-22: 2 g via INTRAVENOUS
  Filled 2020-11-22: qty 20

## 2020-11-22 MED ORDER — SODIUM CHLORIDE 0.9 % IV SOLN: INTRAVENOUS | Status: DC

## 2020-11-22 MED ORDER — LACTATED RINGERS IV BOLUS
1000.0000 mL | Freq: Once | INTRAVENOUS | Status: AC
  Administered 2020-11-22: 1000 mL via INTRAVENOUS

## 2020-11-22 MED ORDER — SODIUM CHLORIDE 0.9 % IV SOLN
3.0000 g | Freq: Four times a day (QID) | INTRAVENOUS | Status: AC
  Administered 2020-11-22 – 2020-11-27 (×20): 3 g via INTRAVENOUS
  Filled 2020-11-22: qty 8
  Filled 2020-11-22 (×2): qty 3
  Filled 2020-11-22: qty 8
  Filled 2020-11-22 (×2): qty 3
  Filled 2020-11-22 (×2): qty 8
  Filled 2020-11-22: qty 3
  Filled 2020-11-22: qty 8
  Filled 2020-11-22: qty 3
  Filled 2020-11-22: qty 8
  Filled 2020-11-22 (×6): qty 3
  Filled 2020-11-22 (×2): qty 8

## 2020-11-22 MED ORDER — METHYLPREDNISOLONE SODIUM SUCC 40 MG IJ SOLR
30.0000 mg | Freq: Two times a day (BID) | INTRAMUSCULAR | Status: DC
  Administered 2020-11-22 – 2020-11-24 (×4): 30 mg via INTRAVENOUS
  Filled 2020-11-22 (×4): qty 1

## 2020-11-22 MED ORDER — IPRATROPIUM-ALBUTEROL 20-100 MCG/ACT IN AERS
1.0000 | INHALATION_SPRAY | Freq: Four times a day (QID) | RESPIRATORY_TRACT | Status: DC
  Administered 2020-11-23 – 2020-11-27 (×21): 1 via RESPIRATORY_TRACT
  Filled 2020-11-22 (×2): qty 4

## 2020-11-22 MED ORDER — SODIUM CHLORIDE 0.9 % IV SOLN
200.0000 mg | Freq: Once | INTRAVENOUS | Status: AC
  Administered 2020-11-22: 200 mg via INTRAVENOUS
  Filled 2020-11-22: qty 200

## 2020-11-22 MED ORDER — PREDNISONE 50 MG PO TABS: 50.0000 mg | ORAL_TABLET | Freq: Every day | ORAL | Status: DC

## 2020-11-22 MED ORDER — LORAZEPAM 2 MG/ML IJ SOLN
1.0000 mg | Freq: Four times a day (QID) | INTRAMUSCULAR | Status: DC | PRN
  Administered 2020-11-22 – 2020-11-23 (×2): 1 mg via INTRAVENOUS
  Filled 2020-11-22 (×2): qty 1

## 2020-11-22 NOTE — Progress Notes (Signed)
[facility] [address] [facility]) Hospitalized Hospice Patient  Mr. Zaremba is our current hospice patient with a terminal diagnosis of peripheral artery disease (PAD). On 1/21, staff at his facility found him to be confused, "blue with sats in the 50s and food smashed around his face". Staff immediately cleared his airway and provided oxygen. Mr. Grieshaber was increasingly agitated and they were unable to calm him down. Staff called his sister Agustin Cree and the decision was made to transfer him to the ED as he seemed to be uncomfortable and agitated. He is being admitted with acute hypoxic respiratory failure secondary to aspiration PNA. This is a related hospital admission.  Unable to visit patient in his room, results from COVID test have not resulted and he is on airborne precautions. Spoke with his sister Eunice Blase, she confirms plan to admit him and is in agreement with plans to treat what can be treated but no invasive or heroic measures.  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

## 2020-11-22 NOTE — ED Notes (Signed)
Per lab, BMP should be resulted in 20 minutes.

## 2020-11-22 NOTE — ED Notes (Signed)
Sister Gavin Pound updated via phone.

## 2020-11-22 NOTE — Progress Notes (Signed)
[facility])  Mr. Puls is our current hospice patient at [facility] with a terminal dx of PAD.  On the night of 1/21, he was found at his facility by staff to be confused, "blue with sats in the 50s and food smashed around his face". Staff immediately cleared his airway and provided oxygen. Mr. Nazari was increasingly agitated and they were unable to calm him down. Staff called his sister Agustin Cree and the decision was made to transfer him to the ED as he seemed to be uncomfortable and agitated.   With hospice, he has an OOF DNR.  His sister Agustin Cree is his NOK and Management consultant.  [facility] will follow and support should he be admitted.  Please reach out if we can be of further assistance.  Wallis Bamberg RN, BSN, CCRN [facility]  [phone], [phone] or epic chat

## 2020-11-22 NOTE — ED Notes (Signed)
Patient bringing up lots of mucous, has been suctioned multiple times.

## 2020-11-22 NOTE — ED Notes (Signed)
CRITICAL VALUE ALERT  Critical Value:  Covid +  Date & Time Notied:  11/22/2020 1752  Provider Notified: Ronaldo Miyamoto, MD  Orders Received/Actions taken: acknowledged

## 2020-11-22 NOTE — H&P (Addendum)
History and Physical    REEDY BIERNAT TWS:[id number] DOB: [birth year] DOA: 12/16/2020  PCP: [name], MD  Patient coming from: [facility] [name]  Chief Complaint: dyspnea  HPI: [facility] [name] is a 55 y.o. male with medical history significant of muscular dystrophy, dysphagia, HTN. Presenting with dyspnea, possible aspiration. Per facility report, he was in his normal health for most of last night. He's there on hospice for muscular dystrophy. Nursing found him later on last night appearing cyanotic. They checked his O2 sat and he was in the 50's. They placed O2 on him and he came up to the 70's. They believed he may have aspirated. They discussed plan with family (sister, NOK) and it was decided to send him to ED for eval.     ED Course: He was found to be hypoxic. Placed on O2 support. CXR was positive for bibasilar infiltrates. He was given rocephin. TR[facility] was called for admission.    Review of Systems:  Unable to obtain d/t mentation.   PMHx Past Medical History:  Diagnosis Date  . DVT (deep venous thrombosis) (HCC)    left brachial vein DVT 01/29/16  . Dysphagia   . Hiccups   . High blood pressure   . History of kidney stones   . Hypertension   . 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HCC)     PSHx Past Surgical History:  Procedure Laterality Date  . AXILLARY-FEMORAL BYPASS GRAFT Right 03/07/2017   Procedure: RIGHT AXILLA-BIFEMORAL BYPASS GRAFT USING HEMASHIELD GOLD VASCULAR GRAFT;  Surgeon: [name], MD;  Location: [facility] OR;  Service: Vascular;  Laterality: Right;  . CHOLECYSTECTOMY    . ENDARTERECTOMY FEMORAL Left 03/07/2017   Procedure: ENDARTERECTOMY LEFT FEMORAL ARTERY ;  Surgeon: [name], MD;  Location: [facility] OR;  Service: Vascular;  Laterality: Left;  . ESOPHAGOGASTRODUODENOSCOPY N/A 05/10/2016   Procedure: ESOPHAGOGASTRODUODENOSCOPY (EGD);  Surgeon: [name], MD;  Location: [facility];  Service: Endoscopy;  Laterality: N/A;  Please discuss sedation  possibilities with Dr. [name]  . ESOPHAGOGASTRODUODENOSCOPY N/A 08/29/2017   Procedure: ESOPHAGOGASTRODUODENOSCOPY (EGD);  Surgeon: [name], MD;  Location: [facility];  Service: Endoscopy;  Laterality: N/A;  . MULTIPLE TOOTH EXTRACTIONS  2007   Pt had all teeth pulled, has not yet gotten dentures.   Marland Kitchen PATCH ANGIOPLASTY Left 03/07/2017   Procedure: PATCH ANGIOPLASTY LEFT FEMORAL ARTERY USING XENOSURE BIOLOGIC PATCH;  Surgeon: [name], MD;  Location: [facility] OR;  Service: Vascular;  Laterality: Left;    SocHx  reports that he quit smoking about 3 years ago. His smoking use included cigarettes. He started smoking about 37 years ago. He smoked 0.30 packs per day. He has never used smokeless tobacco. He reports that he does not drink alcohol and does not use drugs.  No Known Allergies  FamHx Family History  Problem Relation Age of Onset  . COPD Mother   . Diabetes Mother   . Breast cancer Mother   . Cancer Father   . Cancer Sister   . Muscular dystrophy Sister     Prior to Admission medications   Medication Sig Start Date End Date Taking? Authorizing Provider  aspirin EC 81 MG tablet Take 1 tablet (81 mg total) by mouth daily. 01/27/18  Yes [name], MD  benzonatate (TESSALON) 100 MG capsule Take 100 mg by mouth 3 (three) times daily.   Yes [provider]  methocarbamol (ROBAXIN) 500 MG tablet Take 1 tablet (500 mg total) by mouth 2 (two)  times daily as needed for muscle spasms. 01/02/18  Yes [name], MD  mirtazapine (REMERON) 15 MG tablet Take 30 mg by mouth at bedtime.   Yes [provider]  potassium chloride SA (K-DUR,KLOR-CON) 20 MEQ tablet Take 2 tablets (40 mEq total) by mouth every Monday, Wednesday, and Friday. 01/09/18  Yes [name], MD  atorvastatin (LIPITOR) 40 MG tablet Take 40 mg by mouth every evening. Patient not taking: Reported on 11/22/2020    [provider]  famotidine (PEPCID) 20 MG tablet Take 20 mg by mouth 2  (two) times daily. Patient not taking: Reported on 11/22/2020    [provider]  fenofibrate (TRICOR) 145 MG tablet Take 145 mg by mouth daily. Patient not taking: Reported on 11/22/2020    [provider]  guaiFENesin (MUCINEX) 600 MG 12 hr tablet Take 600 mg by mouth every 12 (twelve) hours. Patient not taking: Reported on 11/22/2020    [provider]  HYDROcodone-acetaminophen (NORCO/VICODIN) 5-325 MG tablet Take 1 tablet by mouth every 6 (six) hours as needed for moderate pain. Patient not taking: Reported on 11/22/2020    [provider]  Magnesium 400 MG TABS Take by mouth. Patient not taking: Reported on 11/22/2020    [provider]  magnesium oxide (MAG-OX) 400 MG tablet Take 1 tablet (400 mg total) by mouth daily. Patient not taking: Reported on 11/22/2020 01/07/18   [name], MD  multivitamin (ONE-A-DAY MEN'S) TABS tablet Take 1 tablet by mouth daily. Patient not taking: Reported on 11/22/2020 01/27/18   [name], MD  tamsulosin [facility]) 0.4 MG CAPS capsule Take 1 capsule (0.4 mg total) by mouth daily after breakfast. Patient not taking: Reported on 11/22/2020 02/18/18   [name] T, PA-C  vitamin B-12 (CYANOCOBALAMIN) 1000 MCG tablet Take 1,000 mcg by mouth daily. Patient not taking: Reported on 11/22/2020    [provider]    Physical Exam: Vitals:   11/22/20 0738 11/22/20 0800 11/22/20 0830 11/22/20 0900  BP: 106/84 105/82 101/78 91/75  Pulse: 87 93 93 91  Resp: (!) 26 (!) 27 (!) 26 (!) 26  Temp:      SpO2: 94% 94% 93% 92%    General: 55 y.o. ill appearing male resting in bed Eyes: PERRL, normal sclera ENMT: Nares patent w/o discharge, orophaynx clear ears w/o discharge/lesions/ulcers Neck: Supple, trachea midline Cardiovascular: RRR, +S1, S2, no m/g/r, equal pulses throughout Respiratory: UAT, LUQ rhonchi, rhonchi at the bases, normal WOB on 5L [place] GI: BS+, NDNT, no masses noted, no organomegaly  noted MSK: No e/c/c Skin: No rashes, bruises, ulcerations noted Neuro: A&O x 3, general weakness in all extremities Psyc: flat affect, calm/cooperative, but somewhat confused  Labs on Admission: I have personally reviewed following labs and imaging studies  CBC: Recent Labs  Lab 11/22/20 0048  WBC 8.7  NEUTROABS 7.6  HGB 18.4*  HCT 58.9*  MCV 99.0  PLT 163   Basic Metabolic Panel: Recent Labs  Lab 11/22/20 0449  NA 142  K 4.0  CL 105  CO2 28  GLUCOSE 179*  BUN 10  CREATININE 0.76  CALCIUM 8.7*   GFR: CrCl cannot be calculated (Unknown ideal weight.). Liver Function Tests: No results for input(s): AST, ALT, ALKPHOS, BILITOT, PROT, ALBUMIN in the last 168 hours. No results for input(s): LIPASE, AMYLASE in the last 168 hours. No results for input(s): AMMONIA in the last 168 hours. Coagulation Profile: No results for input(s): INR, PROTIME in the last 168 hours. Cardiac  Enzymes: No results for input(s): CKTOTAL, CKMB, CKMBINDEX, TROPONINI in the last 168 hours. BNP (last 3 results) No results for input(s): PROBNP in the last 8760 hours. HbA1C: No results for input(s): HGBA1C in the last 72 hours. CBG: No results for input(s): GLUCAP in the last 168 hours. Lipid Profile: No results for input(s): CHOL, HDL, LDLCALC, TRIG, CHOLHDL, LDLDIRECT in the last 72 hours. Thyroid Function Tests: No results for input(s): TSH, T4TOTAL, FREET4, T3FREE, THYROIDAB in the last 72 hours. Anemia Panel: No results for input(s): VITAMINB12, FOLATE, FERRITIN, TIBC, IRON, RETICCTPCT in the last 72 hours. Urine analysis:    Component Value Date/Time   COLORURINE YELLOW 10/10/2018 0621   APPEARANCEUR HAZY (A) 10/10/2018 0621   LABSPEC >1.046 (H) 10/10/2018 0621   PHURINE 5.0 10/10/2018 0621   GLUCOSEU NEGATIVE 10/10/2018 0621   HGBUR NEGATIVE 10/10/2018 0621   BILIRUBINUR NEGATIVE 10/10/2018 0621   KETONESUR 5 (A) 10/10/2018 0621   PROTEINUR 30 (A) 10/10/2018 0621   UROBILINOGEN  1.0 11/22/2014 1132   NITRITE NEGATIVE 10/10/2018 0621   LEUKOCYTESUR NEGATIVE 10/10/2018 0621    Radiological Exams on Admission: DG Chest Port 1 View  Result Date: 11/22/2020 CLINICAL DATA:  Hypoxia EXAM: PORTABLE CHEST 1 VIEW COMPARISON:  CT chest 10/09/2018 FINDINGS: Lung volumes are small, but are symmetric. There is bibasilar atelectasis or infiltrate present. Small left pleural effusion present. No pneumothorax. Cardiac size within normal limits. Pulmonary vascularity is normal. IMPRESSION: Bibasilar atelectasis or infiltrate.  Small left pleural effusion. Electronically Signed   By: Helyn Numbers MD   On: 11/22/2020 00:02   Assessment/Plan Acute hypoxic respiratory failure secondary to aspiration/aspiration PNA     - admit to inpt, med-surg     - family would like PNA treated per hospice team     - unasyn, fluids, nebulizers     - keep NPO for now  Muscular dystrophy Hospice patient secondary to above Dysphagia secondary to above     - keep NPO, will remain DNR, plan to discharge back to hospice after this episode; confirmed with family  HLD     - NPO, holding home meds  BPH     - NPO, holding home meds  UPDATE: COVID+, start remdes, steroids, inhalers; d/c nebs. Place in isolation.  DVT prophylaxis: SCDs  Code Status: DNR, confirmed with family Family Communication: Spoke with sister, Gavin Pound, by phone  Consults called: None  Status is: Inpatient  Remains inpatient appropriate because:Inpatient level of care appropriate due to severity of illness   Dispo: The patient is from: SNF              Anticipated d/c is to: SNF              Anticipated d/c date is: 2 days              Patient currently is not medically stable to d/c.   Difficult to place patient No  Teddy Spike DO [facility]  If 7PM-7AM, please contact night-coverage [email or web address]  11/22/2020, 9:11 AM

## 2020-11-23 ENCOUNTER — Inpatient Hospital Stay (HOSPITAL_COMMUNITY)

## 2020-11-23 DIAGNOSIS — J189 Pneumonia, unspecified organism: Secondary | ICD-10-CM

## 2020-11-23 DIAGNOSIS — M7989 Other specified soft tissue disorders: Secondary | ICD-10-CM

## 2020-11-23 LAB — CBC WITH DIFFERENTIAL/PLATELET
Abs Immature Granulocytes: 0.03 10*3/uL (ref 0.00–0.07)
Basophils Absolute: 0 10*3/uL (ref 0.0–0.1)
Basophils Relative: 0 %
Eosinophils Absolute: 0 10*3/uL (ref 0.0–0.5)
Eosinophils Relative: 0 %
HCT: 53.7 % — ABNORMAL HIGH (ref 39.0–52.0)
Hemoglobin: 17.2 g/dL — ABNORMAL HIGH (ref 13.0–17.0)
Immature Granulocytes: 0 %
Lymphocytes Relative: 5 %
Lymphs Abs: 0.4 10*3/uL — ABNORMAL LOW (ref 0.7–4.0)
MCH: 30.6 pg (ref 26.0–34.0)
MCHC: 32 g/dL (ref 30.0–36.0)
MCV: 95.6 fL (ref 80.0–100.0)
Monocytes Absolute: 0.1 10*3/uL (ref 0.1–1.0)
Monocytes Relative: 1 %
Neutro Abs: 8.8 10*3/uL — ABNORMAL HIGH (ref 1.7–7.7)
Neutrophils Relative %: 94 %
Platelets: 158 10*3/uL (ref 150–400)
RBC: 5.62 MIL/uL (ref 4.22–5.81)
RDW: 15.8 % — ABNORMAL HIGH (ref 11.5–15.5)
WBC: 9.3 10*3/uL (ref 4.0–10.5)
nRBC: 0 % (ref 0.0–0.2)

## 2020-11-23 LAB — COMPREHENSIVE METABOLIC PANEL
ALT: 79 U/L — ABNORMAL HIGH (ref 0–44)
AST: 58 U/L — ABNORMAL HIGH (ref 15–41)
Albumin: 3.3 g/dL — ABNORMAL LOW (ref 3.5–5.0)
Alkaline Phosphatase: 119 U/L (ref 38–126)
Anion gap: 15 (ref 5–15)
BUN: 12 mg/dL (ref 6–20)
CO2: 24 mmol/L (ref 22–32)
Calcium: 9.1 mg/dL (ref 8.9–10.3)
Chloride: 106 mmol/L (ref 98–111)
Creatinine, Ser: 0.53 mg/dL — ABNORMAL LOW (ref 0.61–1.24)
GFR, Estimated: >60 mL/min (ref >60)
Glucose, Bld: 153 mg/dL — ABNORMAL HIGH (ref 70–99)
Potassium: 3.7 mmol/L (ref 3.5–5.1)
Sodium: 145 mmol/L (ref 135–145)
Total Bilirubin: 1.3 mg/dL — ABNORMAL HIGH (ref 0.3–1.2)
Total Protein: 6.5 g/dL (ref 6.5–8.1)

## 2020-11-23 LAB — MAGNESIUM: Magnesium: 1.9 mg/dL (ref 1.7–2.4)

## 2020-11-23 LAB — PROCALCITONIN: Procalcitonin: 5.48 ng/mL

## 2020-11-23 LAB — D-DIMER, QUANTITATIVE: D-Dimer, Quant: 11.16 ug/mL-FEU — ABNORMAL HIGH (ref 0.00–0.50)

## 2020-11-23 LAB — C-REACTIVE PROTEIN: CRP: 22.8 mg/dL — ABNORMAL HIGH (ref <1.0)

## 2020-11-23 LAB — BRAIN NATRIURETIC PEPTIDE: B Natriuretic Peptide: 475.3 pg/mL — ABNORMAL HIGH (ref 0.0–100.0)

## 2020-11-23 MED ORDER — DEXTROSE 5 % IV SOLN: INTRAVENOUS | Status: DC

## 2020-11-23 MED ORDER — HYDROMORPHONE HCL 1 MG/ML IJ SOLN
0.5000 mg | INTRAMUSCULAR | Status: DC | PRN
  Administered 2020-11-26: 0.5 mg via INTRAVENOUS
  Filled 2020-11-23: qty 0.5

## 2020-11-23 MED ORDER — DEXTROSE-NACL 5-0.45 % IV SOLN: INTRAVENOUS | Status: DC

## 2020-11-23 MED ORDER — ENOXAPARIN SODIUM 60 MG/0.6ML ~~LOC~~ SOLN
1.0000 mg/kg | Freq: Two times a day (BID) | SUBCUTANEOUS | Status: DC
  Administered 2020-11-23 – 2020-11-25 (×5): 60 mg via SUBCUTANEOUS
  Filled 2020-11-23 (×5): qty 0.6

## 2020-11-23 MED ORDER — FUROSEMIDE 10 MG/ML IJ SOLN
40.0000 mg | Freq: Once | INTRAMUSCULAR | Status: AC
  Administered 2020-11-23: 40 mg via INTRAVENOUS
  Filled 2020-11-23: qty 4

## 2020-11-23 MED ORDER — METHOCARBAMOL 500 MG PO TABS: 500.0000 mg | ORAL_TABLET | Freq: Two times a day (BID) | ORAL | Status: DC | PRN

## 2020-11-23 NOTE — Progress Notes (Signed)
Bilateral lower extremity venous study completed.   Results given to RN   Please see CV Proc for preliminary results.   Quanah Majka, RVT  

## 2020-11-23 NOTE — Plan of Care (Signed)
  Problem: Education: Goal: Knowledge of risk factors and measures for prevention of condition will improve Outcome: Progressing   Problem: Coping: Goal: Psychosocial and spiritual needs will be supported Outcome: Progressing   Problem: Respiratory: Goal: Will maintain a patent airway Outcome: Progressing Goal: Complications related to the disease process, condition or treatment will be avoided or minimized Outcome: Progressing   Problem: Activity: Goal: Ability to tolerate increased activity will improve Outcome: Progressing   Problem: Clinical Measurements: Goal: Ability to maintain a body temperature in the normal range will improve Outcome: Progressing   Problem: Respiratory: Goal: Ability to maintain adequate ventilation will improve Outcome: Progressing Goal: Ability to maintain a clear airway will improve Outcome: Progressing   

## 2020-11-23 NOTE — Progress Notes (Addendum)
[facility] [address] [facility]) Hospitalized Hospice Patient  Mr. Laurich is our current hospice patient with a terminal diagnosis of peripheral artery disease (PAD). On 1/21, staff at his facility found him to be confused, "blue with sats in the 50s and food smashed around his face". Staff immediately cleared his airway and provided oxygen. Mr. Chismar was increasingly agitated and they were unable to calm him down. Staff called his sister Agustin Cree and the decision was made to transfer him to the ED as he seemed to be uncomfortable and agitated. He is being admitted with acute hypoxic respiratory failure secondary to aspiration PNA. This is a related hospital admission.  Unable to visit patient at bedside due to positive Covid infection and he is on airborne precautions. Per report, patient is alert and oriented, denies SOB or chest pain. No fevers. Wants to eat and drink. Currently NPO status.   VS: 97.4, 89/71, 127, 95% 6Lnc I/O: 645/600  Abn Labs: Glucose: 153 (H) Creatinine: 0.53 (L) Calcium: 9.1 Albumin: 3.3 (L) AST: 58 (H) ALT: 79 (H) Total Bilirubin: 1.3 (H) B Natriuretic Peptide: 475.3 (H) CRP: 22.8 (H) Hemoglobin: 17.2 (H) HCT: 53.7 (H) RDW: 15.8 (H) NEUT#: 8.8 (H) Lymphocyte #: 0.4 (L) D-Dimer, Quant: 11.16 (H)  EPIC Imaging: CXR  IMPRESSION: Multifocal pneumonia in this patient with known COVID, improved. Possible small left pleural effusion, improved.  IVs/PRNs: UNASYN 3g IVPB Q6hrs, D5 16ml/hr, remdesivir 200mg  then 100mg  IVPB -completed, Ativan 1mg  IV prn @ 1116  Problem List: Active Problems:   Pneumonia Acute hypoxemic respiratory failure due to aspiration pneumonia and covid-19 pneumonia: SARS-CoV-2 PCR positive on 1/22 despite vaccination reported by patient.  Atrial fibrillation/flutter with RVR Severe PAD HLD BPH Muscular dystrophy  GOC: clear, remain DNR, treat current PNA and return to facility, family is clear they want no heroic  measures or invasive measures beyond treating his current PNA D/C planning: from with hospice support, if he is stable to return after this hospitalization plan is to return there with hospice Family: left VM for sister .  IDT: hospice team updated  Please use [facility] if ambulance transport is needed at discharge.  A Please do not hesitate to call with questions.    Thank you,    2/22, RN, [facility]       [facility] Liaison (listed on [facility] under Hospice /[facility])     [phone]

## 2020-11-23 NOTE — Plan of Care (Signed)
  Problem: Education: Goal: Knowledge of risk factors and measures for prevention of condition will improve Outcome: Progressing   Problem: Coping: Goal: Psychosocial and spiritual needs will be supported Outcome: Progressing   Problem: Respiratory: Goal: Will maintain a patent airway Outcome: Progressing Goal: Complications related to the disease process, condition or treatment will be avoided or minimized Outcome: Progressing   Problem: Activity: Goal: Ability to tolerate increased activity will improve Outcome: Progressing   Problem: Clinical Measurements: Goal: Ability to maintain a body temperature in the normal range will improve Outcome: Progressing   Problem: Respiratory: Goal: Ability to maintain adequate ventilation will improve Outcome: Progressing Goal: Ability to maintain a clear airway will improve Outcome: Progressing   Problem: Education: Goal: Knowledge of risk factors and measures for prevention of condition will improve Outcome: Progressing   Problem: Coping: Goal: Psychosocial and spiritual needs will be supported Outcome: Progressing   Problem: Respiratory: Goal: Will maintain a patent airway Outcome: Progressing Goal: Complications related to the disease process, condition or treatment will be avoided or minimized Outcome: Progressing   Problem: Activity: Goal: Ability to tolerate increased activity will improve Outcome: Progressing   Problem: Clinical Measurements: Goal: Ability to maintain a body temperature in the normal range will improve Outcome: Progressing   Problem: Respiratory: Goal: Ability to maintain adequate ventilation will improve Outcome: Progressing Goal: Ability to maintain a clear airway will improve Outcome: Progressing

## 2020-11-23 NOTE — Progress Notes (Addendum)
Rounded on pt and found pt to be diaphoretic.  Pt has taken off O2.  HR 130-140's.  Increase in Rales/Rhonchi in all fields.  Pt stating he can not breath.  O2 put back on pt.  Oral suctioned with yankeur.  Scant amount of pink frothy sputum noted.  Slight JVD noted.  IVF stopped until MD paged. Change in Heart rhythm noted.  Stat 12 lead unofficial ekg noted af with rvr.  MD Bruna Potter is aware.  As long as pt does not have chest pain will continue to monitor per MD Blount. New orders received.

## 2020-11-23 NOTE — Progress Notes (Signed)
PROGRESS NOTE  Malik MUNTER  [name]:[id number] DOB: 14-Jun-1966 DOA: 11/08/2020 PCP: [name], MD   Brief Narrative: Malik [name] is a 55 y.o. male with a history of muscular dystrophy, dysphagia, severe PAD on hospice at facility, and HTN who was brought to the ED 1/21 after appearing cyanotic and severely hypoxic with high suspicion for aspiration. CXR demonstrated bibasilar infiltrates and hypoxia was confirmed, screening SARS-CoV-2 PCR was positive and inflammatory markers are grossly elevated. Unasyn, remdesivir, and solumedrol have been given with stability in respiratory status. He remains NPO pending SLP evaluation and has experienced some atrial fibrillation with RVR complicated by hypotension which is currently improved.  Assessment & Plan: Active Problems:   Pneumonia  Acute hypoxemic respiratory failure due to aspiration pneumonia and covid-19 pneumonia: SARS-CoV-2 PCR positive on 1/22 despite vaccination reported by patient.  - Continue unasyn. PCT severely elevated.  - Aspiration precautions, NPO pending SLP evaluation.  - Pt is at very high risk of aspirating due to irreversible dystrophy. Hospice is on board. - Continue remdesivir x5 days (started 1/22) - Continue steroids. CRP grossly elevated - Encourage OOB, IS, FV, and awake proning if able - Continue airborne, contact precautions   - Monitor CMP and inflammatory markers - D-dimer grossly elevated > empiric lovenox 1mg /kg ordered pending LE U/S. If stabilizes, will check CTA chest.  Atrial fibrillation/flutter with RVR: Improving.  - Continue telemetry. Minimizing nodal agents with relative hypotension currently.   Severe PAD:  - Continue ASA, statin once able to take po.  HLD:  - Restart meds once taking po  BPH:  - Restart meds once taking po.   Muscular dystrophy, noted.   DVT prophylaxis: Lovenox Code Status: DNR Family Communication: None at bedside Disposition Plan: Return to facility  with hospice depending on clinical trajectory Status is: Inpatient  Remains inpatient appropriate because:IV treatments appropriate due to intensity of illness or inability to take PO   Dispo: The patient is from: Facility              Anticipated d/c is to: Facility vs. Hospice              Anticipated d/c date is: 3 days              Patient currently is not medically stable to d/c.   Difficult to place patient No  Consultants:   None - Hospice following  Procedures:   None  Antimicrobials:  Unasyn  Remdesivir   Subjective: Pt alert and oriented this morning reporting pain all over, denies dyspnea or chest pain or leg swelling/pain. No fevers. Wants to eat and drink.  Objective: Vitals:   11/23/20 0300 11/23/20 0400 11/23/20 0500 11/23/20 0800  BP:  107/80  104/89  Pulse: 88     Resp: (!) 24 (!) 24 (!) 33 20  Temp:    (!) 97.4 F (36.3 C)  TempSrc:    Axillary  SpO2: 99%   97%  Weight:      Height:        Intake/Output Summary (Last 24 hours) at 11/23/2020 1048 Last data filed at 11/23/2020 0600 Gross per 24 hour  Intake 645.74 ml  Output 600 ml  Net 45.74 ml   Filed Weights   11/22/20 1806  Weight: 59 kg   Gen: Frail, chronically ill-appearing male in no acute distress Pulm: Non-labored on supplemental oxygen, upper airway transmission throughout, diminished at bases.  CV: Irreg w/rate in 100's this AM, now sinus tachy  on monitor. No murmur, rub, or gallop. No JVD, no pedal edema. GI: Abdomen soft, non-tender, non-distended, with normoactive bowel sounds. No organomegaly or masses felt. Ext: Warm, spastic deformities x4 extremities Skin: No rashes, lesions or ulcers on visualized skin Neuro: Alert and oriented. No new focal neurological deficits per pt. Psych: Judgement and insight appear normal. Mood & affect appropriate.   Data Reviewed: I have personally reviewed following labs and imaging studies  CBC: Recent Labs  Lab 11/22/20 0048  11/23/20 0811  WBC 8.7 9.3  NEUTROABS 7.6 8.8*  HGB 18.4* 17.2*  HCT 58.9* 53.7*  MCV 99.0 95.6  PLT 163 158   Basic Metabolic Panel: Recent Labs  Lab 11/22/20 0449 11/23/20 0811  NA 142 145  K 4.0 3.7  CL 105 106  CO2 28 24  GLUCOSE 179* 153*  BUN 10 12  CREATININE 0.76 0.53*  CALCIUM 8.7* 9.1  MG  --  1.9   GFR: Estimated Creatinine Clearance: 86 mL/min (A) (by C-G formula based on SCr of 0.53 mg/dL (L)). Liver Function Tests: Recent Labs  Lab 11/23/20 0811  AST 58*  ALT 79*  ALKPHOS 119  BILITOT 1.3*  PROT 6.5  ALBUMIN 3.3*   No results for input(s): LIPASE, AMYLASE in the last 168 hours. No results for input(s): AMMONIA in the last 168 hours. Coagulation Profile: No results for input(s): INR, PROTIME in the last 168 hours. Cardiac Enzymes: No results for input(s): CKTOTAL, CKMB, CKMBINDEX, TROPONINI in the last 168 hours. BNP (last 3 results) No results for input(s): PROBNP in the last 8760 hours. HbA1C: No results for input(s): HGBA1C in the last 72 hours. CBG: Recent Labs  Lab 11/22/20 1131  GLUCAP 176*   Lipid Profile: No results for input(s): CHOL, HDL, LDLCALC, TRIG, CHOLHDL, LDLDIRECT in the last 72 hours. Thyroid Function Tests: No results for input(s): TSH, T4TOTAL, FREET4, T3FREE, THYROIDAB in the last 72 hours. Anemia Panel: No results for input(s): VITAMINB12, FOLATE, FERRITIN, TIBC, IRON, RETICCTPCT in the last 72 hours. Urine analysis:    Component Value Date/Time   COLORURINE YELLOW 10/10/2018 0621   APPEARANCEUR HAZY (A) 10/10/2018 0621   LABSPEC >1.046 (H) 10/10/2018 0621   PHURINE 5.0 10/10/2018 0621   GLUCOSEU NEGATIVE 10/10/2018 0621   HGBUR NEGATIVE 10/10/2018 0621   BILIRUBINUR NEGATIVE 10/10/2018 0621   KETONESUR 5 (A) 10/10/2018 0621   PROTEINUR 30 (A) 10/10/2018 0621   UROBILINOGEN 1.0 11/22/2014 1132   NITRITE NEGATIVE 10/10/2018 0621   LEUKOCYTESUR NEGATIVE 10/10/2018 4696   Recent Results (from the past 240  hour(s))  SARS CORONAVIRUS 2 (TAT 6-24 HRS) Nasopharyngeal Nasopharyngeal Swab     Status: Abnormal   Collection Time: 11/22/20 12:48 AM   Specimen: Nasopharyngeal Swab  Result Value Ref Range Status   SARS Coronavirus 2 POSITIVE (A) NEGATIVE Final    Comment: (NOTE) SARS-CoV-2 target nucleic acids are DETECTED.  The SARS-CoV-2 RNA is generally detectable in upper and lower respiratory specimens during the acute phase of infection. Positive results are indicative of the presence of SARS-CoV-2 RNA. Clinical correlation with patient history and other diagnostic information is  necessary to determine patient infection status. Positive results do not rule out bacterial infection or co-infection with other viruses.  The expected result is Negative.  Fact Sheet for Patients: HairSlick.no  Fact Sheet for Healthcare Providers: quierodirigir.com  This test is not yet approved or cleared by the [place] FDA and  has been authorized for detection and/or diagnosis of SARS-CoV-2 by FDA under  an Emergency Use Authorization (EUA). This EUA will remain  in effect (meaning this test can be used) for the duration of the COVID-19 declaration under Section 564(b)(1) of the Act, 21 U. S.C. section 360bbb-3(b)(1), unless the authorization is terminated or revoked sooner.   Performed at [facility], [address] [place], [place] [address]       Radiology Studies: DG CHEST PORT 1 VIEW  Result Date: 11/23/2020 CLINICAL DATA:  Shortness of breath, COVID positive EXAM: PORTABLE CHEST 1 VIEW COMPARISON:  11/25/2020 FINDINGS: Mild patchy opacities in the bilateral perihilar regions and lower lobes, lingular and left lower lobe predominant, improved. Possible small left pleural effusion, improved. No pneumothorax. The heart is normal in size. Cholecystectomy clips. IMPRESSION: Multifocal pneumonia in this patient with known COVID,  improved. Possible small left pleural effusion, improved. Electronically Signed   By: Charline Bills M.D.   On: 11/23/2020 08:59   DG Chest Port 1 View  Result Date: 11/22/2020 CLINICAL DATA:  Hypoxia EXAM: PORTABLE CHEST 1 VIEW COMPARISON:  CT chest 10/09/2018 FINDINGS: Lung volumes are small, but are symmetric. There is bibasilar atelectasis or infiltrate present. Small left pleural effusion present. No pneumothorax. Cardiac size within normal limits. Pulmonary vascularity is normal. IMPRESSION: Bibasilar atelectasis or infiltrate.  Small left pleural effusion. Electronically Signed   By: Helyn Numbers MD   On: 11/22/2020 00:02    Scheduled Meds: . enoxaparin (LOVENOX) injection  1 mg/kg Subcutaneous Q12H  . Ipratropium-Albuterol  1 puff Inhalation Q6H  . methylPREDNISolone (SOLU-MEDROL) injection  30 mg Intravenous Q12H   Followed by  . [START ON 11/25/2020] predniSONE  50 mg Oral Daily   Continuous Infusions: . ampicillin-sulbactam (UNASYN) IV 3 g (11/23/20 0539)  . remdesivir 100 mg in NS 100 mL 100 mg (11/23/20 0828)     LOS: 1 day   Time spent: 35 minutes.  Tyrone Nine, MD [facility] [email or web address] 11/23/2020, 10:48 AM

## 2020-11-23 NOTE — Progress Notes (Signed)
Patients sister Gavin Pound updated today & all questions & concerns were addressed. Per MD Jarvis Newcomer & charge RN Annabelle Harman pt meets the requirements (hospice/confused) for x1 visitor-I called family member back after getting approval.  Pt weaned from 6L/[place] to 3L [place] (100%) BLE [place] this am-neg for DVT Chest Xray 1/23 am BID sq Lovenox SCD's Frequent oral suctioning & oral care Q2 turns IV abx & remdesivir  NPO - pending swallow eval

## 2020-11-24 ENCOUNTER — Inpatient Hospital Stay (HOSPITAL_COMMUNITY)

## 2020-11-24 DIAGNOSIS — J189 Pneumonia, unspecified organism: Secondary | ICD-10-CM | POA: Diagnosis not present

## 2020-11-24 LAB — PROCALCITONIN: Procalcitonin: 3.41 ng/mL

## 2020-11-24 LAB — CBC
HCT: 54 % — ABNORMAL HIGH (ref 39.0–52.0)
Hemoglobin: 17.2 g/dL — ABNORMAL HIGH (ref 13.0–17.0)
MCH: 30.7 pg (ref 26.0–34.0)
MCHC: 31.9 g/dL (ref 30.0–36.0)
MCV: 96.4 fL (ref 80.0–100.0)
Platelets: 179 10*3/uL (ref 150–400)
RBC: 5.6 MIL/uL (ref 4.22–5.81)
RDW: 16 % — ABNORMAL HIGH (ref 11.5–15.5)
WBC: 6.3 10*3/uL (ref 4.0–10.5)
nRBC: 0 % (ref 0.0–0.2)

## 2020-11-24 LAB — COMPREHENSIVE METABOLIC PANEL
ALT: 67 U/L — ABNORMAL HIGH (ref 0–44)
AST: 43 U/L — ABNORMAL HIGH (ref 15–41)
Albumin: 3.6 g/dL (ref 3.5–5.0)
Alkaline Phosphatase: 110 U/L (ref 38–126)
Anion gap: 15 (ref 5–15)
BUN: 17 mg/dL (ref 6–20)
CO2: 28 mmol/L (ref 22–32)
Calcium: 9.1 mg/dL (ref 8.9–10.3)
Chloride: 105 mmol/L (ref 98–111)
Creatinine, Ser: 0.55 mg/dL — ABNORMAL LOW (ref 0.61–1.24)
GFR, Estimated: >60 mL/min (ref >60)
Glucose, Bld: 174 mg/dL — ABNORMAL HIGH (ref 70–99)
Potassium: 3.3 mmol/L — ABNORMAL LOW (ref 3.5–5.1)
Sodium: 148 mmol/L — ABNORMAL HIGH (ref 135–145)
Total Bilirubin: 1.2 mg/dL (ref 0.3–1.2)
Total Protein: 6.9 g/dL (ref 6.5–8.1)

## 2020-11-24 LAB — C-REACTIVE PROTEIN: CRP: 12.8 mg/dL — ABNORMAL HIGH (ref <1.0)

## 2020-11-24 LAB — D-DIMER, QUANTITATIVE: D-Dimer, Quant: 7.18 ug/mL-FEU — ABNORMAL HIGH (ref 0.00–0.50)

## 2020-11-24 MED ORDER — METHYLPREDNISOLONE SODIUM SUCC 125 MG IJ SOLR
40.0000 mg | Freq: Two times a day (BID) | INTRAMUSCULAR | Status: DC
  Administered 2020-11-24 – 2020-11-27 (×7): 40 mg via INTRAVENOUS
  Filled 2020-11-24 (×7): qty 2

## 2020-11-24 MED ORDER — SODIUM CHLORIDE 0.9% FLUSH
10.0000 mL | INTRAVENOUS | Status: DC | PRN
Start: 2020-11-24 — End: 2020-11-28

## 2020-11-24 MED ORDER — POTASSIUM CHLORIDE 10 MEQ/100ML IV SOLN
10.0000 meq | INTRAVENOUS | Status: AC
Start: 2020-11-24 — End: 2020-11-24
  Administered 2020-11-24 (×4): 10 meq via INTRAVENOUS
  Filled 2020-11-24 (×4): qty 100

## 2020-11-24 MED ORDER — SODIUM CHLORIDE 0.9% FLUSH
10.0000 mL | Freq: Two times a day (BID) | INTRAVENOUS | Status: DC
  Administered 2020-11-25: 10 mL

## 2020-11-24 MED ORDER — IOHEXOL 350 MG/ML SOLN
100.0000 mL | Freq: Once | INTRAVENOUS | Status: AC | PRN
  Administered 2020-11-24: 100 mL via INTRAVENOUS

## 2020-11-24 NOTE — Plan of Care (Signed)
  Problem: Education: Goal: Knowledge of risk factors and measures for prevention of condition will improve Outcome: Progressing   Problem: Coping: Goal: Psychosocial and spiritual needs will be supported Outcome: Progressing   Problem: Respiratory: Goal: Will maintain a patent airway Outcome: Progressing Goal: Complications related to the disease process, condition or treatment will be avoided or minimized Outcome: Progressing   Problem: Activity: Goal: Ability to tolerate increased activity will improve Outcome: Progressing   Problem: Clinical Measurements: Goal: Ability to maintain a body temperature in the normal range will improve Outcome: Progressing   Problem: Respiratory: Goal: Ability to maintain adequate ventilation will improve Outcome: Progressing Goal: Ability to maintain a clear airway will improve Outcome: Progressing   

## 2020-11-24 NOTE — Progress Notes (Signed)
PROGRESS NOTE  Malik [name]  XBM:[id number] DOB: 04-08-1966 DOA: 12/05/20 PCP: [name], MD   Brief Narrative: Malik [name] is a 55 y.o. male with a history of muscular dystrophy, dysphagia, severe PAD on hospice at facility, and HTN who was brought to the ED 1/21 after appearing cyanotic and severely hypoxic with high suspicion for aspiration. CXR demonstrated bibasilar infiltrates and hypoxia was confirmed, screening SARS-CoV-2 PCR was positive and inflammatory markers are grossly elevated. Unasyn, remdesivir, and solumedrol have been given with stability in respiratory status. He remains NPO pending SLP evaluation and has experienced some atrial fibrillation with RVR complicated by hypotension which is currently improved.  Assessment & Plan: Active Problems:   Pneumonia  Acute hypoxemic respiratory failure due to aspiration pneumonia and covid-19 pneumonia:  - Wean oxygen as tolerated and treat conditions as enumerated below.   Aspiration pneumonia, recurrent, severe dysphagia:  - Continue unasyn. PCT severely elevated but improving.  - Aspiration precautions, NPO to continue based on severeity of  - Pt is at very high risk of aspirating due to irreversible dystrophy. Hospice is on board, though the patient currently opts for NPO, IVF, IV Abx, and not comfort feeds. Per SLP: Patient presents with a mod-severe oropharyngeal dysphagia with overt s/s potential aspiration and/or penetration with small amount of ice chips. Patient exhibited delayed throat clearing and cough with ice chips (4 ice chips total) and did require some oral suctioining as he was able to cough up some secretions. His cough is weak, voice is clear but weak and he exhibits decreased laryngeal elevation per palpation. He had MBS in 2019 which revealed mod-severe pharyngeal phase dysphagia with gross pharyngeal secretions that patient had difficulty clearing. SLP recommending NPO except small amount of ice  chips PRN after oral care and when alert.  Covid-19 pneumonia: SARS-CoV-2 PCR positive on 1/22 despite vaccination reported by patient.  - Continue remdesivir x5 days (started 1/22) - Continue steroids. CRP grossly elevated - Encourage OOB, IS, FV, and awake proning if able - Continue airborne, contact precautions   - Monitor CMP and inflammatory markers - D-dimer grossly elevated, note Hx DVT > empiric lovenox 1mg /kg ordered, check CTA chest.  Atrial fibrillation/flutter with RVR: Improving.  - Continue telemetry. Minimizing nodal agents with relative hypotension currently.   Hypokalemia:  - Supplement by IV  Hypernatremia:  - Continue hypotonic IVF and monitor  Severe PAD:  - Continue ASA, statin once able to take po.  HLD:  - Restart meds once taking po  BPH:  - Restart meds once taking po.   Muscular dystrophy, noted. Suggests that severe dysphagia is an irreversible process.  DVT prophylaxis: Lovenox Code Status: DNR Family Communication: None at bedside; will contact family to continue GOC discussions. Gave me permission to call Aunts and sister Disposition Plan: Return to facility with hospice depending on clinical trajectory Status is: Inpatient  Remains inpatient appropriate because:IV treatments appropriate due to intensity of illness or inability to take PO   Dispo: The patient is from: Facility              Anticipated d/c is to: Facility vs. Hospice - Currently the patient's goals of care are rather clear. He wants to continue hospitalization, IV antibiotics, IVF, and remain NPO. The alternative, which is to transition to comfort measures with aim to alleviate pain or dyspnea and allow per oral intake ad lib, has been discussed as well.  Anticipated d/c date is: 3 days              Patient currently is not medically stable to d/c.   Difficult to place patient No  Consultants:   None - Hospice following  Procedures:    None  Antimicrobials:  Unasyn  Remdesivir   Subjective: Pt alert and oriented this afternoon, had very significant aspiration with just a few ice chips with SLP this morning. NPO continues to be recommended. I swabbed his mouth. He has controlled pain everywhere which is baseline and has no new complaints today.   Objective: Vitals:   11/24/20 1000 11/24/20 1100 11/24/20 1200 11/24/20 1342  BP: (!) 146/90  (!) 151/88 (!) 140/119  Pulse: (!) 58  71 84  Resp: (!) 30 (!) 28 (!) 29 19  Temp:   97.9 F (36.6 C) 98 F (36.7 C)  TempSrc:   Oral Oral  SpO2: 94%  94% 93%  Weight:      Height:        Intake/Output Summary (Last 24 hours) at 11/24/2020 1433 Last data filed at 11/24/2020 9323 Gross per 24 hour  Intake --  Output 600 ml  Net -600 ml   Filed Weights   11/22/20 1806  Weight: 59 kg   Gen: Thin, frail, chronically ill-appearing male in no distress Pulm: Nonlabored but tachypneic breathing supine. Clear anteriorly. CV: Regular rate and rhythm. No murmur, rub, or gallop. No JVD, no dependent edema. GI: Abdomen soft, non-tender, non-distended, with normoactive bowel sounds.  Ext: Warm, spasticity and decreased muscle bulk stable Skin: No rashes, lesions or ulcers on visualized skin. Neuro: Alert and oriented. Low volume speech, very weak wet sounding cough throughout encounter despite no po intake. Psych: Judgement and insight appear fair. Mood euthymic & affect congruent. Behavior is appropriate.    Data Reviewed: I have personally reviewed following labs and imaging studies  CBC: Recent Labs  Lab 11/22/20 0048 11/23/20 0811 11/24/20 0418  WBC 8.7 9.3 6.3  NEUTROABS 7.6 8.8*  --   HGB 18.4* 17.2* 17.2*  HCT 58.9* 53.7* 54.0*  MCV 99.0 95.6 96.4  PLT 163 158 179   Basic Metabolic Panel: Recent Labs  Lab 11/22/20 0449 11/23/20 0811 11/24/20 0418  NA 142 145 148*  K 4.0 3.7 3.3*  CL 105 106 105  CO2 28 24 28   GLUCOSE 179* 153* 174*  BUN 10 12 17    CREATININE 0.76 0.53* 0.55*  CALCIUM 8.7* 9.1 9.1  MG  --  1.9  --    GFR: Estimated Creatinine Clearance: 86 mL/min (A) (by C-G formula based on SCr of 0.55 mg/dL (L)). Liver Function Tests: Recent Labs  Lab 11/23/20 0811 11/24/20 0418  AST 58* 43*  ALT 79* 67*  ALKPHOS 119 110  BILITOT 1.3* 1.2  PROT 6.5 6.9  ALBUMIN 3.3* 3.6   No results for input(s): LIPASE, AMYLASE in the last 168 hours. No results for input(s): AMMONIA in the last 168 hours. Coagulation Profile: No results for input(s): INR, PROTIME in the last 168 hours. Cardiac Enzymes: No results for input(s): CKTOTAL, CKMB, CKMBINDEX, TROPONINI in the last 168 hours. BNP (last 3 results) No results for input(s): PROBNP in the last 8760 hours. HbA1C: No results for input(s): HGBA1C in the last 72 hours. CBG: Recent Labs  Lab 11/22/20 1131  GLUCAP 176*   Lipid Profile: No results for input(s): CHOL, HDL, LDLCALC, TRIG, CHOLHDL, LDLDIRECT in the last 72 hours. Thyroid Function Tests: No results for  input(s): TSH, T4TOTAL, FREET4, T3FREE, THYROIDAB in the last 72 hours. Anemia Panel: No results for input(s): VITAMINB12, FOLATE, FERRITIN, TIBC, IRON, RETICCTPCT in the last 72 hours. Urine analysis:    Component Value Date/Time   COLORURINE YELLOW 10/10/2018 0621   APPEARANCEUR HAZY (A) 10/10/2018 0621   LABSPEC >1.046 (H) 10/10/2018 0621   PHURINE 5.0 10/10/2018 0621   GLUCOSEU NEGATIVE 10/10/2018 0621   HGBUR NEGATIVE 10/10/2018 0621   BILIRUBINUR NEGATIVE 10/10/2018 0621   KETONESUR 5 (A) 10/10/2018 0621   PROTEINUR 30 (A) 10/10/2018 0621   UROBILINOGEN 1.0 11/22/2014 1132   NITRITE NEGATIVE 10/10/2018 0621   LEUKOCYTESUR NEGATIVE 10/10/2018 16100621   Recent Results (from the past 240 hour(s))  SARS CORONAVIRUS 2 (TAT 6-24 HRS) Nasopharyngeal Nasopharyngeal Swab     Status: Abnormal   Collection Time: 11/22/20 12:48 AM   Specimen: Nasopharyngeal Swab  Result Value Ref Range Status   SARS Coronavirus  2 POSITIVE (A) NEGATIVE Final    Comment: (NOTE) SARS-CoV-2 target nucleic acids are DETECTED.  The SARS-CoV-2 RNA is generally detectable in upper and lower respiratory specimens during the acute phase of infection. Positive results are indicative of the presence of SARS-CoV-2 RNA. Clinical correlation with patient history and other diagnostic information is  necessary to determine patient infection status. Positive results do not rule out bacterial infection or co-infection with other viruses.  The expected result is Negative.  Fact Sheet for Patients: HairSlick.nohttps://www.fda.gov/media/138098/download  Fact Sheet for Healthcare Providers: quierodirigir.comhttps://www.fda.gov/media/138095/download  This test is not yet approved or cleared by the [place] FDA and  has been authorized for detection and/or diagnosis of SARS-CoV-2 by FDA under an Emergency Use Authorization (EUA). This EUA will remain  in effect (meaning this test can be used) for the duration of the COVID-19 declaration under Section 564(b)(1) of the Act, 21 U. S.C. section 360bbb-3(b)(1), unless the authorization is terminated or revoked sooner.   Performed at [facility], [address] [place], [place] [address]       Radiology Studies: DG CHEST PORT 1 VIEW  Result Date: 11/23/2020 CLINICAL DATA:  Shortness of breath, COVID positive EXAM: PORTABLE CHEST 1 VIEW COMPARISON:  27-Sep-2021 FINDINGS: Mild patchy opacities in the bilateral perihilar regions and lower lobes, lingular and left lower lobe predominant, improved. Possible small left pleural effusion, improved. No pneumothorax. The heart is normal in size. Cholecystectomy clips. IMPRESSION: Multifocal pneumonia in this patient with known COVID, improved. Possible small left pleural effusion, improved. Electronically Signed   By: Charline BillsSriyesh  Krishnan M.D.   On: 11/23/2020 08:59   VAS US LOWER EXTREMITY VENOUS (DVT)  Result Date: 11/23/2020  Lower Venous DVT Study  Indications: Swelling. Other Indications: COVID Hospice patient. 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 Anticoagulation: Lovenox. Comparison Study: 01/2016 Performing Technologist: Clint GuyLisa Gibson RVT  Examination Guidelines: A complete evaluation includes B-mode imaging, spectral Doppler, color Doppler, and power Doppler as needed of all accessible portions of each vessel. Bilateral testing is considered an integral part of a complete examination. Limited examinations for reoccurring indications may be performed as noted. The reflux portion of the exam is performed with the patient in reverse Trendelenburg.  +---------+---------------+---------+-----------+----------+------------------+ RIGHT    CompressibilityPhasicitySpontaneityPropertiesThrombus Aging     +---------+---------------+---------+-----------+----------+------------------+ CFV      Full           Yes      Yes                                     +---------+---------------+---------+-----------+----------+------------------+  SFJ      Full                                                            +---------+---------------+---------+-----------+----------+------------------+ FV Prox  Full                                                            +---------+---------------+---------+-----------+----------+------------------+ FV Mid   Full                                                            +---------+---------------+---------+-----------+----------+------------------+ FV Distal                                             Post phlebotic                                                           syndrome           +---------+---------------+---------+-----------+----------+------------------+ PFV      Full                                                            +---------+---------------+---------+-----------+----------+------------------+ POP      Full            No       No                                      +---------+---------------+---------+-----------+----------+------------------+ PTV      Full                                                            +---------+---------------+---------+-----------+----------+------------------+ PERO     Full                                                            +---------+---------------+---------+-----------+----------+------------------+   +---------+---------------+---------+-----------+----------+------------------+ LEFT     CompressibilityPhasicitySpontaneityPropertiesThrombus Aging     +---------+---------------+---------+-----------+----------+------------------+ CFV      Full  Yes      Yes                                     +---------+---------------+---------+-----------+----------+------------------+ SFJ      Full                                                            +---------+---------------+---------+-----------+----------+------------------+ FV Prox                                               Post phlebotic                                                           syndrome           +---------+---------------+---------+-----------+----------+------------------+ FV Mid                                                Post phlebotic                                                           syndrome           +---------+---------------+---------+-----------+----------+------------------+ FV Distal                                             Post phlebotic                                                           syndrome           +---------+---------------+---------+-----------+----------+------------------+ PFV      Full                                                            +---------+---------------+---------+-----------+----------+------------------+ POP      Full                                                             +---------+---------------+---------+-----------+----------+------------------+  PTV      Full                                                            +---------+---------------+---------+-----------+----------+------------------+ PERO     Full                                                            +---------+---------------+---------+-----------+----------+------------------+  Summary: RIGHT: - No cystic structure found in the popliteal fossa. 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 Electronically signed by Waverly Ferrarihristopher Dickson MD on 11/23/2020 at 4:29:05 PM.    Final     Scheduled Meds: . enoxaparin (LOVENOX) injection  1 mg/kg Subcutaneous Q12H  . Ipratropium-Albuterol  1 puff Inhalation Q6H  . methylPREDNISolone (SOLU-MEDROL) injection  40 mg Intravenous Q12H   Continuous Infusions: . ampicillin-sulbactam (UNASYN) IV 3 g (11/24/20 1207)  . dextrose 5 % and 0.45% NaCl 50 mL/hr at 11/23/20 2009  . remdesivir 100 mg in NS 100 mL 100 mg (11/24/20 0936)     LOS: 2 days   Time spent: 35 minutes.  Tyrone Nineyan B Zakary Kimura, MD [facility] [email or web address] 11/24/2020, 2:33 PM

## 2020-11-24 NOTE — Progress Notes (Signed)
   11/24/20 0900  Assess: MEWS Score  Pulse Rate 79  ECG Heart Rate 80  Resp (!) 26  Level of Consciousness Alert  SpO2 93 %  O2 Device Nasal Cannula  O2 Flow Rate (L/min) 3 L/min  Assess: MEWS Score  MEWS Temp 0  MEWS Systolic 0  MEWS Pulse 0  MEWS RR 2  MEWS LOC 0  MEWS Score 2  MEWS Score Color Yellow  Assess: if the MEWS score is Yellow or Red  Were vital signs taken at a resting state? Yes  Focused Assessment No change from prior assessment  Early Detection of Sepsis Score *See Row Information* Low  MEWS guidelines implemented *See Row Information* No, previously yellow, continue vital signs every 4 hours

## 2020-11-24 NOTE — Progress Notes (Signed)
   11/24/20 1200  Assess: MEWS Score  Temp 97.9 F (36.6 C)  BP (!) 151/88  Pulse Rate 71  ECG Heart Rate 71  Resp (!) 29  Level of Consciousness Alert  SpO2 94 %  O2 Device Nasal Cannula  O2 Flow Rate (L/min) 3 L/min  Assess: MEWS Score  MEWS Temp 0  MEWS Systolic 0  MEWS Pulse 0  MEWS RR 2  MEWS LOC 0  MEWS Score 2  MEWS Score Color Yellow  Assess: if the MEWS score is Yellow or Red  Were vital signs taken at a resting state? Yes  Focused Assessment No change from prior assessment  Early Detection of Sepsis Score *See Row Information* Low  MEWS guidelines implemented *See Row Information* No, previously yellow, continue vital signs every 4 hours  Treat  Pain Scale 0-10  Pain Score 0  Notify: Charge Nurse/RN  Name of Charge Nurse/RN Notified Marissa Long RN  Date Charge Nurse/RN Notified 11/24/20  Time Charge Nurse/RN Notified 1110  Notify: Provider  Provider Name/Title Dr. Jarvis Newcomer  Date Provider Notified 11/24/20  Time Provider Notified 1110  Notification Type Face-to-face (during rounds)  Notification Reason Other (Comment) (yellow muse)  Response No new orders  Date of Provider Response 11/24/20  Time of Provider Response 1110

## 2020-11-24 NOTE — Progress Notes (Signed)
MD updated via phone regarding order written for CT Angiography. CT is requesting 20 gauge IV to be placed for the test Nursing and IV team have attempted IV but unable. IV team asked to retry later this evening.

## 2020-11-24 NOTE — Progress Notes (Signed)
[facility] [address] [facility]) Hospitalized Hospice Patient  Mr. Balsam is our current hospice patient with a terminal diagnosis of peripheral artery disease (PAD). On 1/21, staff at his facility found him to be confused, "blue with sats in the 50s and food smashed around his face". Staff immediately cleared his airway and provided oxygen. Mr. Dacosta was increasingly agitated and they were unable to calm him down. Staff called his sister Agustin Cree and the decision was made to transfer him to the ED as he seemed to be uncomfortable and agitated. He is being admitted with acute hypoxic respiratory failure secondary to aspiration PNA. This is a related hospital admission.  Unable to visit patient at bedside due to positive Covid infection and he is on airborne precautions. Alert and oriented, pain is controlled. He a speech evaluation to assess aspiration this morning with some signifcant aspiration. Remains NPO at this time.   VS: 98, 140/119, 71, 29, 93% 3Lnc I/O: 0/1300  Abnormal labs: 11/24/2020 04:18 Sodium: 148 (H) Potassium: 3.3 (L) Chloride: 105 Glucose: 174 (H) Creatinine: 0.55 (L) AST: 43 (H) ALT: 67 (H) CRP: 12.8 (H) Hemoglobin: 17.2 (H) HCT: 54.0 (H) RDW: 16.0 (H)  IV/PRN medications: Unasyn 3gm IVPB Q6hrs, D5 @50ml /hr, No PRNs given today  Problem List: Active Problems:   Pneumonia Acute hypoxemic respiratory failure due to aspiration pneumonia and covid-19 pneumonia - Continue remdesivir x5 days (started 1/22) - Continue steroids. CRP grossly elevated - D-dimer grossly elevated Aspiration pneumonia, recurrent, severe dysphagia Problem List: Active Problems:   Pneumonia Acute hypoxemic respiratory failure due to aspiration pneumonia and covid-19 pneumonia: SARS-CoV-2 PCR positive on 1/22 despite vaccination reported by patient.  Atrial fibrillation/flutter with RVR Severe PAD HLD BPH Muscular dystrophy  GOC: clear, remain DNR, treat current PNA  and return to facility, family is clear they want no heroic measures or invasive measures beyond treating his current PNA D/C planning: from 2/22 with hospice support, if he is stable to return after this hospitalization plan is to return there with hospice Family: spoke with sister by phone IDT: hospice team updated  Please use [facility] if ambulance transport is needed at discharge.  A Please do not hesitate to call with questions.    Thank you,    [place], RN, [facility]       [facility] Liaison (listed on [facility] under Hospice [facility])     [phone]

## 2020-11-24 NOTE — Evaluation (Signed)
Clinical/Bedside Swallow Evaluation Patient Details  Name: Malik [name] MRN: [id number] Date of Birth: Nov 29, 1965  Today's Date: 11/24/2020 Time: SLP Start Time (ACUTE ONLY): 1020 SLP Stop Time (ACUTE ONLY): 1045 SLP Time Calculation (min) (ACUTE ONLY): 25 min  Past Medical History:  Past Medical History:  Diagnosis Date  . DVT (deep venous thrombosis) (HCC)    left brachial vein DVT 01/29/16  . Dysphagia   . Hiccups   . High blood pressure   . History of kidney stones   . Hypertension   . 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 AXILLARY-FEMORAL BYPASS GRAFT Right 03/07/2017   Procedure: RIGHT AXILLA-BIFEMORAL BYPASS GRAFT USING HEMASHIELD GOLD VASCULAR GRAFT;  Surgeon: [name], MD;  Location: [facility] OR;  Service: Vascular;  Laterality: Right;  . CHOLECYSTECTOMY    . ENDARTERECTOMY FEMORAL Left 03/07/2017   Procedure: ENDARTERECTOMY LEFT FEMORAL ARTERY ;  Surgeon: [name], MD;  Location: [facility] OR;  Service: Vascular;  Laterality: Left;  . ESOPHAGOGASTRODUODENOSCOPY N/A 05/10/2016   Procedure: ESOPHAGOGASTRODUODENOSCOPY (EGD);  Surgeon: [name], MD;  Location: [facility];  Service: Endoscopy;  Laterality: N/A;  Please discuss sedation possibilities with Dr. [name]  . ESOPHAGOGASTRODUODENOSCOPY N/A 08/29/2017   Procedure: ESOPHAGOGASTRODUODENOSCOPY (EGD);  Surgeon: [name], MD;  Location: [facility];  Service: Endoscopy;  Laterality: N/A;  . MULTIPLE TOOTH EXTRACTIONS  2007   Pt had all teeth pulled, has not yet gotten dentures.   Marland Kitchen PATCH ANGIOPLASTY Left 03/07/2017   Procedure: PATCH ANGIOPLASTY LEFT FEMORAL ARTERY USING XENOSURE BIOLOGIC PATCH;  Surgeon: [name], MD;  Location: [facility] OR;  Service: Vascular;  Laterality: Left;   HPI:  Patient is a 55 y.o. 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 CXR revealed bibasilar infiltrates and hypoxia was confirmed. COVID-19 testing was positive and inflammatory markers were grossly elevated. Patient was made NPO pending SLP evaluation. MBS in 2019 revealed mild oral and moderately severe pharyngo-esophageal dysphagia with gross pharyngeal residuals which patient had difficulty clearing.   Assessment / Plan / Recommendation Clinical Impression  Patient presents with a mod-severe oropharyngeal dysphagia with overt s/s potential aspiration and/or penetration with small amount of ice chips. Patient exhibited delayed throat clearing and cough with ice chips (4 ice chips total) and did require some oral suctioining as he was able to cough up some secretions. His cough is weak, voice is clear but weak and he exhibits decreased laryngeal elevation per palpation. He had MBS in 2019 which revealed mod-severe pharyngeal phase dysphagia with gross pharyngeal secretions that patient had difficulty clearing. SLP recommending NPO except small amount of ice chips PRN after oral care and when alert. Will determine GOC secondary to patient being a prior and current hospice patient. SLP Visit Diagnosis: Dysphagia, unspecified (R13.10)    Aspiration Risk  Severe aspiration risk;Risk for inadequate nutrition/hydration    Diet Recommendation NPO;Ice chips PRN after oral care   Medication Administration: Via alternative means Supervision: Full supervision/cueing for compensatory strategies;Staff to assist with self feeding Postural Changes: Seated upright at 90 degrees    Other  Recommendations Oral Care Recommendations: Oral care QID;Staff/trained caregiver to provide oral care   Follow up Recommendations Other (comment) (TBD)      Frequency and Duration min 2x/week  1 week  Prognosis Prognosis for Safe Diet Advancement: Guarded Barriers to Reach Goals: Severity of deficits Barriers/Prognosis Comment:  Malik [name] has been a hospice patient at a SNF prior to this hospitalization      Swallow Study   General Date of Onset: 11/22/20 HPI: Patient is a 55 y.o. 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 CXR revealed bibasilar infiltrates and hypoxia was confirmed. COVID-19 testing was positive and inflammatory markers were grossly elevated. Patient was made NPO pending SLP evaluation. MBS in 2019 revealed mild oral and moderately severe pharyngo-esophageal dysphagia with gross pharyngeal residuals which patient had difficulty clearing. Type of Study: Bedside Swallow Evaluation Previous Swallow Assessment: 2019 MBS Diet Prior to this Study: NPO Temperature Spikes Noted: No Respiratory Status: Nasal cannula History of Recent Intubation: No Behavior/Cognition: Cooperative;Lethargic/Drowsy Oral Cavity Assessment: Dry Oral Care Completed by SLP: Yes Oral Cavity - Dentition: Edentulous Vision: Impaired for self-feeding Self-Feeding Abilities: Total assist Patient Positioning: Upright in bed Baseline Vocal Quality: Low vocal intensity;Normal Volitional Cough: Weak Volitional Swallow: Unable to elicit    Oral/Motor/Sensory Function Overall Oral Motor/Sensory Function: Generalized oral weakness Facial Symmetry: Within Functional Limits Facial Strength: Reduced right;Reduced left Lingual ROM: Reduced right;Reduced left Lingual Symmetry: Within Functional Limits Lingual Strength: Reduced   Ice Chips Ice chips: Impaired Presentation: Spoon Pharyngeal Phase Impairments: Throat Clearing - Delayed;Cough - Delayed;Decreased hyoid-laryngeal movement   Thin Liquid Thin Liquid: Not tested    Nectar Thick   NT  Honey Thick   NT  Puree Puree: Not tested   Solid     Solid: Not tested     Malik Nevin, MA, CCC-SLP Speech Therapy

## 2020-11-25 DIAGNOSIS — J189 Pneumonia, unspecified organism: Secondary | ICD-10-CM | POA: Diagnosis not present

## 2020-11-25 LAB — COMPREHENSIVE METABOLIC PANEL
ALT: 59 U/L — ABNORMAL HIGH (ref 0–44)
AST: 45 U/L — ABNORMAL HIGH (ref 15–41)
Albumin: 3.1 g/dL — ABNORMAL LOW (ref 3.5–5.0)
Alkaline Phosphatase: 89 U/L (ref 38–126)
Anion gap: 15 (ref 5–15)
BUN: 11 mg/dL (ref 6–20)
CO2: 27 mmol/L (ref 22–32)
Calcium: 8.4 mg/dL — ABNORMAL LOW (ref 8.9–10.3)
Chloride: 102 mmol/L (ref 98–111)
Creatinine, Ser: 0.35 mg/dL — ABNORMAL LOW (ref 0.61–1.24)
GFR, Estimated: >60 mL/min (ref >60)
Glucose, Bld: 224 mg/dL — ABNORMAL HIGH (ref 70–99)
Potassium: 3.8 mmol/L (ref 3.5–5.1)
Sodium: 144 mmol/L (ref 135–145)
Total Bilirubin: 1.1 mg/dL (ref 0.3–1.2)
Total Protein: 5.9 g/dL — ABNORMAL LOW (ref 6.5–8.1)

## 2020-11-25 LAB — C-REACTIVE PROTEIN: CRP: 6 mg/dL — ABNORMAL HIGH (ref <1.0)

## 2020-11-25 LAB — PROCALCITONIN: Procalcitonin: 1.15 ng/mL

## 2020-11-25 LAB — D-DIMER, QUANTITATIVE: D-Dimer, Quant: 4.84 ug/mL-FEU — ABNORMAL HIGH (ref 0.00–0.50)

## 2020-11-25 MED ORDER — ENOXAPARIN SODIUM 30 MG/0.3ML ~~LOC~~ SOLN
30.0000 mg | Freq: Two times a day (BID) | SUBCUTANEOUS | Status: DC
  Administered 2020-11-25 – 2020-11-27 (×4): 30 mg via SUBCUTANEOUS
  Filled 2020-11-25 (×4): qty 0.3

## 2020-11-25 NOTE — Progress Notes (Addendum)
Initial Nutrition Assessment  DOCUMENTATION CODES:   Not applicable  INTERVENTION:  - will monitor for SLP recommendations and ongoing GOC.   NUTRITION DIAGNOSIS:   Increased nutrient needs related to acute illness,catabolic illness (COVID-19 infection) as evidenced by estimated needs  GOAL:   Patient will meet greater than or equal to 90% of their needs  MONITOR:   Diet advancement,Labs,Weight trends  REASON FOR ASSESSMENT:   Malnutrition Screening Tool  ASSESSMENT:   55 y.o. male with a history of muscular dystrophy, dysphagia, severe PAD on hospice at facility, and HTN. He presented to the ED on 1/21 after he was noted to be cyanotic and severely hypoxic with high concern for aspiration. CXR in the ED showed bibasilar infiltrates. COVID-19 test in the ED was positive.  Patient has been NPO since admission. RD able to talk with SLP about patient prior to visit to patient's room. SLP will be seeing patient in the near future.   Patient laying in bed and stating he feels generally unwell. He resides at [facility] and reports that he has dentures that fit but he rarely wears them so at facility he is to receive pureed foods, but that items received are not always the right consistency, despite him attempting to educate staff on his need for this consistency.  He likes to sleep in and usually skips breakfast, eats lunch and dinner meals. The last time he had something to eat was when he took a bite of a vienna sausage on 1/21. Prior to that, he is unable to recall the last time he had a meal.   He denies any swallowing difficulties PTA and states that he was able to feed himself without difficulty at the facility.   Patient remains NPO since admission. Brought up with patient that should he remain hospitalized and continue to desire aggressive treatment of illness and if he remains unsafe for PO intake, then small bore NGT will need to be placed for initiation of TF. Patient  adamantly declines.   Weight on 1/22 was 130 lb and PTA the most recently documented weight was on 10/09/18 when he weighed 128 lb.   Patient reports that he allows staff at [facility] to weigh him once/month and that weight has been stable for as long as he can remember.   Despite findings from NFPE, hesitate to dx malnutrition in light of patient with hx of muscular dystrophy so muscle wasting is to be expected.    Labs reviewed; creatinine: 0.35 mg/dl, Ca: 8.4 mg/dl.  Medications reviewed; 40 mg solu-medrol BID, 10 mEq IV KCl x4 runs 1/24, 100 mg IV remdesivir x1 dose/day x4 days (1/23-1/26). IVF; D5-1/2 NS @ 50 ml/hr (204 kcal/24 hours).     NUTRITION - FOCUSED PHYSICAL EXAM:  Flowsheet Row Most Recent Value  Orbital Region Moderate depletion  Upper Arm Region No depletion  Thoracic and Lumbar Region Unable to assess  Buccal Region Moderate depletion  Temple Region Mild depletion  Clavicle Bone Region Moderate depletion  Clavicle and Acromion Bone Region Moderate depletion  Scapular Bone Region Unable to assess  Dorsal Hand Moderate depletion  Patellar Region Severe depletion  Anterior Thigh Region Severe depletion  Posterior Calf Region Severe depletion  Edema (RD Assessment) None  Hair Reviewed  Eyes Reviewed  Mouth Reviewed  Skin Reviewed  Nails Reviewed       Diet Order:   Diet Order            Diet NPO time specified  Diet effective now                 EDUCATION NEEDS:   Education needs have been addressed  Skin:  Skin Assessment: Reviewed RN Assessment  Last BM:  1/24 (type 6)  Height:   Ht Readings from Last 1 Encounters:  11/22/20 5\' 7"  (1.702 m)    Weight:   Wt Readings from Last 1 Encounters:  11/22/20 59 kg     Estimated Nutritional Needs:  Kcal:  1770-1950 kcal Protein:  90-105 grams Fluid:  >/= 2.2 L/day     11/24/20, MS, RD, LDN, CNSC Inpatient Clinical Dietitian RD pager # available in [facility]  After  hours/weekend pager # available in [facility]

## 2020-11-25 NOTE — Progress Notes (Signed)
[facility] [address] [facility]) Hospitalized Hospice Patient  Malik [name] is our current hospice patient with a terminal diagnosis of peripheral artery disease (PAD). On 1/21, staff at his facility found him to be confused, "blue with sats in the 50s and food smashed around his face". Staff immediately cleared his airway and provided oxygen. Malik [name] was increasingly agitated and they were unable to calm him down. Staff called his sister Malik [name] and the decision was made to transfer him to the ED as he seemed to be uncomfortable and agitated. He is being admitted with acute hypoxic respiratory failure secondary to aspiration PNA. This is a related hospital admission.  Unable to visit patient at bedside due to positive Covid infection on 1/22 and he is on airborne precautions. Alert and oriented, pain is controlled. Remains NPO at this time due to aspiration risk. Tried to reach contact sister Malik [name], unable to reach or leave a voicemail as voicemail is full. Will try back later.  VS: 97.3, 119/85, 70, 25, 98% 2LNC I/O: ? Not charted  Abnormal labs:  Glucose: 224 (H) Creatinine: 0.35 (L) Calcium: 8.4 (L) Albumin: 3.1 (L) AST: 45 (H) ALT: 59 (H) Total Protein: 5.9 (L) CRP: 6.0 (H) D-Dimer, Quant: 4.84 (H)   IV/PRN medications: Unasyn 3gm IVPB Q6hrs, D5 @50ml /hr, Remdesivir 100mg  Daily. No PRNs given  Problem List: Active Problems:   Pneumonia Acute hypoxemic respiratory failure due to aspiration pneumonia and covid-19 pneumonia - Continue remdesivir x5 days (started 1/22) - Continue steroids. CRP grossly elevated - D-dimer grossly elevated Aspiration pneumonia, recurrent, severe dysphagia Problem List: Active Problems:   Pneumonia Acute hypoxemic respiratory failure due to aspiration pneumonia and covid-19 pneumonia: SARS-CoV-2 PCR positive on 1/22 despite vaccination reported by patient.  Atrial fibrillation/flutter with RVR Severe PAD HLD BPH Muscular  dystrophy  GOC: clear, remain DNR, treat current PNA and return to facility, family is clear they want no heroic measures or invasive measures beyond treating his current PNA D/C planning: from 2/22 with hospice support, if he is stable to return after this hospitalization plan is to return there with hospice Family: Waiting for call back from sister 2/22 IDT: hospice team updated  Please use [facility] if ambulance transport is needed at discharge.  [place], BSN, Malik [name] [phone]

## 2020-11-25 NOTE — Plan of Care (Signed)
  Problem: Education: Goal: Knowledge of risk factors and measures for prevention of condition will improve Outcome: Progressing   Problem: Coping: Goal: Psychosocial and spiritual needs will be supported Outcome: Progressing   Problem: Respiratory: Goal: Will maintain a patent airway Outcome: Progressing Goal: Complications related to the disease process, condition or treatment will be avoided or minimized Outcome: Progressing   Problem: Activity: Goal: Ability to tolerate increased activity will improve Outcome: Progressing   Problem: Clinical Measurements: Goal: Ability to maintain a body temperature in the normal range will improve Outcome: Progressing   Problem: Respiratory: Goal: Ability to maintain adequate ventilation will improve Outcome: Progressing Goal: Ability to maintain a clear airway will improve Outcome: Progressing   

## 2020-11-25 NOTE — Progress Notes (Signed)
PROGRESS NOTE  Malik RoRobert C Holstein  ONG:[id number] DOB: 04-09-1966 DOA: 11/01/2020 PCP: Sherron Mondayejan-Sie, S Ahmed, MD   Brief Narrative: Malik [name] is a 55 y.o. male with a history of muscular dystrophy, dysphagia, severe PAD on hospice at facility, and HTN who was brought to the ED 1/21 after appearing cyanotic and severely hypoxic with high suspicion for aspiration. CXR demonstrated bibasilar infiltrates and hypoxia was confirmed, screening SARS-CoV-2 PCR was positive and inflammatory markers are grossly elevated. Unasyn, remdesivir, and solumedrol have been given with stability in respiratory status. He remains NPO in the setting of very severe aspiration during SLP evaluation. Required suctioning with 4 ice chips. Goals of care counseling has been continued daily. Currently the patient/family's wishes are to continue NPO, IVF, IV abx and recheck SLP evaluation later this week. If aspiration risk remains severe, plan is to transition formally to hospice.  Assessment & Plan: Active Problems:   Pneumonia  Acute hypoxemic respiratory failure due to aspiration pneumonia and covid-19 pneumonia, left pleural effusion:  - Minimally hypoxic, will continue weaning efforts. Since not terribly symptomatic and a hospice patient, will not plan to drain pleural fluid. - Treat conditions as below  Aspiration pneumonia, recurrent, severe dysphagia:  - Continue unasyn.  - Aspiration precautions. Suction available at bedside constantly. - Pt is at very high risk of aspirating due to irreversible dystrophy. Hospice is on board, though the patient currently opts for NPO, IVF, IV Abx, and not comfort feeds. Per SLP: Patient presents with a mod-severe oropharyngeal dysphagia with overt s/s potential aspiration and/or penetration with small amount of ice chips. Patient exhibited delayed throat clearing and cough with ice chips (4 ice chips total) and did require some oral suctioining as he was able to cough up some  secretions. His cough is weak, voice is clear but weak and he exhibits decreased laryngeal elevation per palpation. He had MBS in 2019 which revealed mod-severe pharyngeal phase dysphagia with gross pharyngeal secretions that patient had difficulty clearing. SLP recommending NPO except small amount of ice chips PRN after oral care and when alert. - Will complete 5th day of remdesivir/abx 1/26, then will request repeat SLP evaluation.  - Part of goals of care discussions included artificial feeding which the patient declines and is also unlikely to mitigate risk of aspiration.  Covid-19 pneumonia: SARS-CoV-2 PCR positive on 1/22 despite vaccination reported by patient.  - Continue remdesivir x5 days (started 1/22) - Continue steroids. CRP improving 22 > 12 > 6 - Encourage OOB, IS, FV, and awake proning if able - Continue airborne, contact precautions   - Monitor CMP and inflammatory markers - D-dimer grossly elevated though no large PE on CTA and no DVT on U/S, note Hx DVT > continue lovenox, decrease to 0.5mg /kg q12h  Atrial fibrillation/flutter with RVR: Improving.  - Continue telemetry. Minimizing nodal agents with relative hypotension currently.   Hypokalemia:  - Supplement by IV  Hypernatremia:  - Continue hypotonic IVF and monitor  Severe PAD:  - Continue ASA, statin once able to take po.  HLD:  - Restart meds if/when taking po  BPH:  - Restart meds once taking po.   Muscular dystrophy, noted. Suggests that severe dysphagia is an irreversible process.  DVT prophylaxis: Lovenox Code Status: DNR Family Communication: None at bedside; spoke with Aunt by phone 1/24 and 1/25. Disposition Plan: Return to facility with hospice depending on clinical trajectory Status is: Inpatient  Remains inpatient appropriate because:IV treatments appropriate due to intensity of illness or inability  to take PO   Dispo: The patient is from: Facility              Anticipated d/c is to: Facility  vs. Hospice - Currently the patient's goals of care are rather clear. He wants to continue hospitalization, IV antibiotics, IVF, and remain NPO. The alternative, which is to transition to comfort measures with aim to alleviate pain or dyspnea and allow per oral intake ad lib, has been discussed as well.              Anticipated d/c date is: 3 days              Patient currently is not medically stable to d/c.   Difficult to place patient No  Consultants:   None - Hospice following  Procedures:   None  Antimicrobials:  Unasyn  Remdesivir   Subjective: Pt has been yelling out because he's cold, then a few minutes later because he's hot, etc. He reports pain is controlled. No chest pain. Having trouble coughing up anything, though he has incredibly wet secretions. Willing to remain NPO until retrial of SLP 1/27.   Objective: Vitals:   11/25/20 0100 11/25/20 0600 11/25/20 0746 11/25/20 0923  BP: (!) 119/93 (!) 111/94  119/85  Pulse: (!) 110 (!) 106  70  Resp: (!) 22 (!) 24  (!) 25  Temp: 98.5 F (36.9 C) 98.7 F (37.1 C)  (!) 97.3 F (36.3 C)  TempSrc: Oral Oral  Oral  SpO2: 94% 92% 98% 98%  Weight:      Height:        Intake/Output Summary (Last 24 hours) at 11/25/2020 1207 Last data filed at 11/25/2020 0344 Gross per 24 hour  Intake 917.52 ml  Output 1100 ml  Net -182.48 ml   Filed Weights   11/22/20 1806  Weight: 59 kg   Gen: Frail, chronically ill-appearing male in no distress Pulm: Nonlabored breathing supplemental oxygen. Diminished throughout. CV: Regular tachycardia. No murmur, rub, or gallop. No JVD, no dependent edema. GI: Abdomen soft, non-tender, non-distended, with normoactive bowel sounds.  Ext: Warm, stable contractures, decreased muscle bulk Skin: No new rashes, lesions or ulcers on visualized skin. Neuro: Alert and oriented. No new focal neurological deficits. Psych: Judgement and insight appear fair. Mood euthymic & affect congruent.    Data  Reviewed: I have personally reviewed following labs and imaging studies  CBC: Recent Labs  Lab 11/22/20 0048 11/23/20 0811 11/24/20 0418  WBC 8.7 9.3 6.3  NEUTROABS 7.6 8.8*  --   HGB 18.4* 17.2* 17.2*  HCT 58.9* 53.7* 54.0*  MCV 99.0 95.6 96.4  PLT 163 158 179   Basic Metabolic Panel: Recent Labs  Lab 11/22/20 0449 11/23/20 0811 11/24/20 0418 11/25/20 0357  NA 142 145 148* 144  K 4.0 3.7 3.3* 3.8  CL 105 106 105 102  CO2 28 24 28 27   GLUCOSE 179* 153* 174* 224*  BUN 10 12 17 11   CREATININE 0.76 0.53* 0.55* 0.35*  CALCIUM 8.7* 9.1 9.1 8.4*  MG  --  1.9  --   --    GFR: Estimated Creatinine Clearance: 86 mL/min (A) (by C-G formula based on SCr of 0.35 mg/dL (L)). Liver Function Tests: Recent Labs  Lab 11/23/20 0811 11/24/20 0418 11/25/20 0357  AST 58* 43* 45*  ALT 79* 67* 59*  ALKPHOS 119 110 89  BILITOT 1.3* 1.2 1.1  PROT 6.5 6.9 5.9*  ALBUMIN 3.3* 3.6 3.1*   No results for input(s):  LIPASE, AMYLASE in the last 168 hours. No results for input(s): AMMONIA in the last 168 hours. Coagulation Profile: No results for input(s): INR, PROTIME in the last 168 hours. Cardiac Enzymes: No results for input(s): CKTOTAL, CKMB, CKMBINDEX, TROPONINI in the last 168 hours. BNP (last 3 results) No results for input(s): PROBNP in the last 8760 hours. HbA1C: No results for input(s): HGBA1C in the last 72 hours. CBG: Recent Labs  Lab 11/22/20 1131  GLUCAP 176*   Lipid Profile: No results for input(s): CHOL, HDL, LDLCALC, TRIG, CHOLHDL, LDLDIRECT in the last 72 hours. Thyroid Function Tests: No results for input(s): TSH, T4TOTAL, FREET4, T3FREE, THYROIDAB in the last 72 hours. Anemia Panel: No results for input(s): VITAMINB12, FOLATE, FERRITIN, TIBC, IRON, RETICCTPCT in the last 72 hours. Urine analysis:    Component Value Date/Time   COLORURINE YELLOW 10/10/2018 0621   APPEARANCEUR HAZY (A) 10/10/2018 0621   LABSPEC >1.046 (H) 10/10/2018 0621   PHURINE 5.0  10/10/2018 0621   GLUCOSEU NEGATIVE 10/10/2018 0621   HGBUR NEGATIVE 10/10/2018 0621   BILIRUBINUR NEGATIVE 10/10/2018 0621   KETONESUR 5 (A) 10/10/2018 0621   PROTEINUR 30 (A) 10/10/2018 0621   UROBILINOGEN 1.0 11/22/2014 1132   NITRITE NEGATIVE 10/10/2018 0621   LEUKOCYTESUR NEGATIVE 10/10/2018 5625   Recent Results (from the past 240 hour(s))  SARS CORONAVIRUS 2 (TAT 6-24 HRS) Nasopharyngeal Nasopharyngeal Swab     Status: Abnormal   Collection Time: 11/22/20 12:48 AM   Specimen: Nasopharyngeal Swab  Result Value Ref Range Status   SARS Coronavirus 2 POSITIVE (A) NEGATIVE Final    Comment: (NOTE) SARS-CoV-2 target nucleic acids are DETECTED.  The SARS-CoV-2 RNA is generally detectable in upper and lower respiratory specimens during the acute phase of infection. Positive results are indicative of the presence of SARS-CoV-2 RNA. Clinical correlation with patient history and other diagnostic information is  necessary to determine patient infection status. Positive results do not rule out bacterial infection or co-infection with other viruses.  The expected result is Negative.  Fact Sheet for Patients: HairSlick.no  Fact Sheet for Healthcare Providers: quierodirigir.com  This test is not yet approved or cleared by the [place] FDA and  has been authorized for detection and/or diagnosis of SARS-CoV-2 by FDA under an Emergency Use Authorization (EUA). This EUA will remain  in effect (meaning this test can be used) for the duration of the COVID-19 declaration under Section 564(b)(1) of the Act, 21 U. S.C. section 360bbb-3(b)(1), unless the authorization is terminated or revoked sooner.   Performed at [facility], [address] [place], [place] [address]       Radiology Studies: CT ANGIO CHEST PE W OR WO CONTRAST  Result Date: 11/24/2020 CLINICAL DATA:  Hypoxia and shortness of breath.  Positive D-dimer.  EXAM: CT ANGIOGRAPHY CHEST WITH CONTRAST TECHNIQUE: Multidetector CT imaging of the chest was performed using the standard protocol during bolus administration of intravenous contrast. Multiplanar CT image reconstructions and MIPs were obtained to evaluate the vascular anatomy. CONTRAST:  OMNIPAQUE IOHEXOL 350 MG/ML SOLN COMPARISON:  10/09/2018 FINDINGS: Cardiovascular: Contrast injection is sufficient to demonstrate satisfactory opacification of the pulmonary arteries to the segmental level. There is no pulmonary embolus or evidence of right heart strain. The size of the main pulmonary artery is normal. Heart size is normal, with no pericardial effusion. The course and caliber of the aorta are normal. There is no atherosclerotic calcification. Opacification decreased due to pulmonary arterial phase contrast bolus timing. Mediastinum/Nodes: Patulous esophagus.  No mediastinal adenopathy. Normal thyroid. Lungs/Pleura: Left pleural effusion with consolidation/collapse much of the left lung. There is also consolidation in the right lower lobe. Upper Abdomen: Contrast bolus timing is not optimized for evaluation of the abdominal organs. The visualized portions of the organs of the upper abdomen are normal. Musculoskeletal: Diffuse muscular atrophy. Review of the MIP images confirms the above findings. IMPRESSION: 1. No pulmonary embolus. 2. Left pleural effusion and left-greater-than-right multifocal consolidation, likely indicating pneumonia. Electronically Signed   By: Deatra Robinson M.D.   On: 11/24/2020 21:31    Scheduled Meds: . enoxaparin (LOVENOX) injection  1 mg/kg Subcutaneous Q12H  . Ipratropium-Albuterol  1 puff Inhalation Q6H  . methylPREDNISolone (SOLU-MEDROL) injection  40 mg Intravenous Q12H  . sodium chloride flush  10-40 mL Intracatheter Q12H   Continuous Infusions: . ampicillin-sulbactam (UNASYN) IV 3 g (11/25/20 1202)  . dextrose 5 % and 0.45% NaCl 50 mL/hr at 11/24/20 2230  .  remdesivir 100 mg in NS 100 mL 100 mg (11/25/20 1037)     LOS: 3 days   Time spent: 35 minutes.  Tyrone Nine, MD [facility] [email or web address] 11/25/2020, 12:07 PM

## 2020-11-25 NOTE — Progress Notes (Signed)
SLP Cancellation Note  Patient Details Name: Malik [name] MRN: [id number] DOB: 1966-05-01   Cancelled treatment:       Reason Eval/Treat Not Completed:  (Per MD note, SLP to reevaluate pt on Thursday 1/27 after COVID treatment.)  [name], MS [facility] SLP Acute Rehab Services Office [phone] Pager [phone]   [name] 11/25/2020, 4:40 PM

## 2020-11-26 DIAGNOSIS — J690 Pneumonitis due to inhalation of food and vomit: Secondary | ICD-10-CM

## 2020-11-26 LAB — BASIC METABOLIC PANEL
Anion gap: 11 (ref 5–15)
BUN: 8 mg/dL (ref 6–20)
CO2: 30 mmol/L (ref 22–32)
Calcium: 8.4 mg/dL — ABNORMAL LOW (ref 8.9–10.3)
Chloride: 103 mmol/L (ref 98–111)
Creatinine, Ser: 0.36 mg/dL — ABNORMAL LOW (ref 0.61–1.24)
GFR, Estimated: >60 mL/min (ref >60)
Glucose, Bld: 199 mg/dL — ABNORMAL HIGH (ref 70–99)
Potassium: 3.4 mmol/L — ABNORMAL LOW (ref 3.5–5.1)
Sodium: 144 mmol/L (ref 135–145)

## 2020-11-26 LAB — C-REACTIVE PROTEIN: CRP: 2.6 mg/dL — ABNORMAL HIGH

## 2020-11-26 LAB — D-DIMER, QUANTITATIVE: D-Dimer, Quant: 3.59 ug{FEU}/mL — ABNORMAL HIGH (ref 0.00–0.50)

## 2020-11-26 LAB — GLUCOSE, CAPILLARY
Glucose-Capillary: 252 mg/dL — ABNORMAL HIGH (ref 70–99)
Glucose-Capillary: 201 mg/dL — ABNORMAL HIGH (ref 70–99)
Glucose-Capillary: 218 mg/dL — ABNORMAL HIGH (ref 70–99)
Glucose-Capillary: 207 mg/dL — ABNORMAL HIGH (ref 70–99)

## 2020-11-26 LAB — COMPREHENSIVE METABOLIC PANEL WITH GFR
ALT: 53 U/L — ABNORMAL HIGH (ref 0–44)
AST: 40 U/L (ref 15–41)
Albumin: 2.9 g/dL — ABNORMAL LOW (ref 3.5–5.0)
Alkaline Phosphatase: 81 U/L (ref 38–126)
Anion gap: 12 (ref 5–15)
BUN: 11 mg/dL (ref 6–20)
CO2: 30 mmol/L (ref 22–32)
Calcium: 8.3 mg/dL — ABNORMAL LOW (ref 8.9–10.3)
Chloride: 103 mmol/L (ref 98–111)
Creatinine, Ser: 0.44 mg/dL — ABNORMAL LOW (ref 0.61–1.24)
GFR, Estimated: >60 mL/min
Glucose, Bld: 262 mg/dL — ABNORMAL HIGH (ref 70–99)
Potassium: 2.9 mmol/L — ABNORMAL LOW (ref 3.5–5.1)
Sodium: 145 mmol/L (ref 135–145)
Total Bilirubin: 1.2 mg/dL (ref 0.3–1.2)
Total Protein: 5.8 g/dL — ABNORMAL LOW (ref 6.5–8.1)

## 2020-11-26 LAB — HEMOGLOBIN A1C
Hgb A1c MFr Bld: 6 % — ABNORMAL HIGH (ref 4.8–5.6)
Mean Plasma Glucose: 125.5 mg/dL

## 2020-11-26 MED ORDER — POTASSIUM CHLORIDE 20 MEQ PO PACK: 40.0000 meq | PACK | Freq: Once | ORAL | Status: DC

## 2020-11-26 MED ORDER — INSULIN ASPART 100 UNIT/ML ~~LOC~~ SOLN
0.0000 [IU] | Freq: Three times a day (TID) | SUBCUTANEOUS | Status: DC
  Administered 2020-11-26: 5 [IU] via SUBCUTANEOUS
  Administered 2020-11-26 – 2020-11-27 (×5): 3 [IU] via SUBCUTANEOUS

## 2020-11-26 MED ORDER — METOPROLOL TARTRATE 25 MG PO TABS: 12.5000 mg | ORAL_TABLET | Freq: Two times a day (BID) | ORAL | Status: DC

## 2020-11-26 MED ORDER — METOPROLOL TARTRATE 5 MG/5ML IV SOLN
2.5000 mg | INTRAVENOUS | Status: DC | PRN
  Administered 2020-11-27: 2.5 mg via INTRAVENOUS
  Filled 2020-11-26: qty 5

## 2020-11-26 MED ORDER — KCL IN DEXTROSE-NACL 40-5-0.45 MEQ/L-%-% IV SOLN
INTRAVENOUS | Status: AC
  Filled 2020-11-26 (×2): qty 1000

## 2020-11-26 MED ORDER — POTASSIUM CHLORIDE 10 MEQ/100ML IV SOLN
10.0000 meq | INTRAVENOUS | Status: AC
  Administered 2020-11-26 (×4): 10 meq via INTRAVENOUS
  Filled 2020-11-26 (×4): qty 100

## 2020-11-26 NOTE — Progress Notes (Addendum)
PROGRESS NOTE    NASEEM VARDEN  MEQ:[id number] DOB: [birth year] DOA: 11/29/2020 PCP: [name], MD   Chief Complaint  Patient presents with  . Aspiration    [name]    Brief Narrative:  JAQUALYN JUDAY is Alejah Aristizabal 55 y.o. male with Adelaida Reindel history of muscular dystrophy, dysphagia, severe PAD on hospice at facility, and HTN who was brought to the ED 1/21 after appearing cyanotic and severely hypoxic with high suspicion for aspiration. CXR demonstrated bibasilar infiltrates and hypoxia was confirmed, screening SARS-CoV-2 PCR was positive and inflammatory markers are grossly elevated. Unasyn, remdesivir, and solumedrol have been given with stability in respiratory status. He remains NPO in the setting of very severe aspiration during SLP evaluation. Required suctioning with 4 ice chips. Goals of care counseling has been continued daily. Currently the patient/family's wishes are to continue NPO, IVF, IV abx and recheck SLP evaluation later this week. If aspiration risk remains severe, plan is to transition formally to hospice.  Assessment & Plan:   Active Problems:   Pneumonia  Acute hypoxemic respiratory failure due to aspiration pneumonia and covid-19 pneumonia, left pleural effusion:  - Minimally hypoxic, currently requiring 2 L, will continue weaning efforts. Since not terribly symptomatic and Manolito Jurewicz hospice patient, will hold off on thoracentesis at this time. - CT chest with L pleural effusion and L>R multifocal consolidation  - CXR 1/27 pending - Treat conditions as below  Aspiration pneumonia, recurrent, severe dysphagia:  - Continue unasyn.  - Aspiration precautions. Suction available at bedside constantly. - Pt is at very high risk of aspirating due to irreversible dystrophy. Hospice is on board, though the patient currently opts for NPO, IVF, IV Abx, and not comfort feeds. Per SLP: Patient presents with [place] mod-severe oropharyngeal dysphagia with overt s/s potential aspiration  and/or penetration with small amount of ice chips. Patient exhibited delayed throat clearing and cough with ice chips (4 ice chips total) and did require some oral suctioining as he was able to cough up some secretions. His cough is weak, voice is clear but weak and he exhibits decreased laryngeal elevation per palpation. He had MBS in 2019 which revealed mod-severe pharyngeal phase dysphagia with gross pharyngeal secretions that patient had difficulty clearing. SLP recommending NPO except small amount of ice chips PRN after oral care and when alert. - Will complete 5th day of remdesivir/abx 1/26, then will request repeat SLP evaluation on 1/27.  - Per previous provider "part of goals of care discussions included artificial feeding which the patient declines and is also unlikely to mitigate risk of aspiration".  Covid-19 pneumonia: SARS-CoV-2 PCR positive on 1/22 despite vaccination reported by patient.  - Continue remdesivir x5 days (1/22-26) - Continue steroids. CRP improving. - Encourage OOB, IS, FV, and awake proning if able - strict I/O, daily weights - Continue airborne, contact precautions   - Monitor CMP and inflammatory markers - D-dimer grossly elevated though no large PE on CTA and no DVT on U/S, note Hx DVT > continue lovenox, decrease to 0.5mg /kg q12h  COVID-19 Labs  Recent Labs    11/24/20 0418 11/25/20 0357 11/26/20 0500  DDIMER 7.18* 4.84* 3.59*  CRP 12.8* 6.0* 2.6*    Lab Results  Component Value Date   SARSCOV2NAA POSITIVE (Devorah Givhan) 11/22/2020   Atrial fibrillation/flutter with RVR: Improving.  - Continue telemetry. Minimizing nodal agents with relative hypotension currently.   Hypokalemia:  - Supplement by IV  Hypernatremia:  - Continue hypotonic IVF and monitor  Severe PAD:  -  Continue ASA, statin once able to take po.  HLD:  - Restart meds if/when taking po  BPH:  - Restart meds once taking po.   Muscular dystrophy, noted. Suggests that severe  dysphagia is an irreversible process.   DVT prophylaxis: lovenox Code Status: DNR Family Communication: none at bedside Disposition:   Status is: Inpatient  Remains inpatient appropriate because:Inpatient level of care appropriate due to severity of illness   Dispo: The patient is from: long term care, hospice              Anticipated d/c is to: pending              Anticipated d/c date is: > 3 days              Patient currently is not medically stable to d/c.   Difficult to place patient No  Consultants:   none  Procedures: LE [place] Summary:  RIGHT:  - No cystic structure found in the popliteal fossa.  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   Antimicrobials:  Anti-infectives (From admission, onward)   Start     Dose/Rate Route Frequency Ordered Stop   11/23/20 1000  remdesivir 100 mg in sodium chloride 0.9 % 100 mL IVPB       "Followed by" Linked Group Details   100 mg 200 mL/hr over 30 Minutes Intravenous Daily 11/22/20 1757 11/26/20 1130   11/22/20 1930  remdesivir 200 mg in sodium chloride 0.9% 250 mL IVPB       "Followed by" Linked Group Details   200 mg 580 mL/hr over 30 Minutes Intravenous Once 11/22/20 1757 11/22/20 2052   11/22/20 1215  Ampicillin-Sulbactam (UNASYN) 3 g in sodium chloride 0.9 % 100 mL IVPB        3 g 200 mL/hr over 30 Minutes Intravenous Every 6 hours 11/22/20 1210 11/27/20 1214   11/22/20 0015  cefTRIAXone (ROCEPHIN) 2 g in sodium chloride 0.9 % 100 mL IVPB        2 g 200 mL/hr over 30 Minutes Intravenous  Once 11/22/20 0010 11/22/20 0106         Subjective: Just feels bad in general C/o pain all over C/o SOB   Objective: Vitals:   11/26/20 0809 11/26/20 1000 11/26/20 1200 11/26/20 1400  BP:  (!) 127/97 (!) 131/95 (!)  143/89  Pulse:  85 71 80  Resp:  (!) 24 (!) 27 20  Temp: 98.5 F (36.9 C)     TempSrc: Oral     SpO2:  92% 96% 94%  Weight:      Height:        Intake/Output Summary (Last 24 hours) at 11/26/2020 1530 Last data filed at 11/26/2020 1500 Gross per 24 hour  Intake 1510.92 ml  Output 1000 ml  Net 510.92 ml   Filed Weights   11/22/20 1806  Weight: 59 kg    Examination:  General exam: Appears calm and comfortable  Respiratory system: no increased wob appreciated, no clear adventitious lung sounds Cardiovascular system: S1 & S2 heard, RRR. Gastrointestinal system: Abdomen is nondistended, soft and nontender. Central nervous system: Alert and oriented. No focal neurological deficits. Extremities: no lee Skin: No rashes, lesions or ulcers Psychiatry: Judgement and insight appear normal. Mood &  affect appropriate.     Data Reviewed: I have personally reviewed following labs and imaging studies  CBC: Recent Labs  Lab 11/22/20 0048 11/23/20 0811 11/24/20 0418  WBC 8.7 9.3 6.3  NEUTROABS 7.6 8.8*  --   HGB 18.4* 17.2* 17.2*  HCT 58.9* 53.7* 54.0*  MCV 99.0 95.6 96.4  PLT 163 158 179    Basic Metabolic Panel: Recent Labs  Lab 11/22/20 0449 11/23/20 0811 11/24/20 0418 11/25/20 0357 11/26/20 0500  NA 142 145 148* 144 145  K 4.0 3.7 3.3* 3.8 2.9*  CL 105 106 105 102 103  CO2 28 24 28 27 30   GLUCOSE 179* 153* 174* 224* 262*  BUN 10 12 17 11 11   CREATININE 0.76 0.53* 0.55* 0.35* 0.44*  CALCIUM 8.7* 9.1 9.1 8.4* 8.3*  MG  --  1.9  --   --   --     GFR: Estimated Creatinine Clearance: 86 mL/min (Jacobs Golab) (by C-G formula based on SCr of 0.44 mg/dL (L)).  Liver Function Tests: Recent Labs  Lab 11/23/20 0811 11/24/20 0418 11/25/20 0357 11/26/20 0500  AST 58* 43* 45* 40  ALT 79* 67* 59* 53*  ALKPHOS 119 110 89 81  BILITOT 1.3* 1.2 1.1 1.2  PROT 6.5 6.9 5.9* 5.8*  ALBUMIN 3.3* 3.6 3.1* 2.9*    CBG: Recent Labs  Lab 11/22/20 1131 11/26/20 0807 11/26/20 1200   GLUCAP 176* 252* 201*     Recent Results (from the past 240 hour(s))  SARS CORONAVIRUS 2 (TAT 6-24 HRS) Nasopharyngeal Nasopharyngeal Swab     Status: Abnormal   Collection Time: 11/22/20 12:48 AM   Specimen: Nasopharyngeal Swab  Result Value Ref Range Status   SARS Coronavirus 2 POSITIVE (Becka Lagasse) NEGATIVE Final    Comment: (NOTE) SARS-CoV-2 target nucleic acids are DETECTED.  The SARS-CoV-2 RNA is generally detectable in upper and lower respiratory specimens during the acute phase of infection. Positive results are indicative of the presence of SARS-CoV-2 RNA. Clinical correlation with patient history and other diagnostic information is  necessary to determine patient infection status. Positive results do not rule out bacterial infection or co-infection with other viruses.  The expected result is Negative.  Fact Sheet for Patients: 11/11/2020  Fact Sheet for Healthcare Providers: 11/24/20  This test is not yet approved or cleared by the [place] FDA and  has been authorized for detection and/or diagnosis of SARS-CoV-2 by FDA under an Emergency Use Authorization (EUA). This EUA will remain  in effect (meaning this test can be used) for the duration of the COVID-19 declaration under Section 564(b)(1) of the Act, 21 U. S.C. section 360bbb-3(b)(1), unless the authorization is terminated or revoked sooner.   Performed at [facility], [address], [place], [address] [place]          Radiology Studies: CT ANGIO CHEST PE W OR WO CONTRAST  Result Date: 11/24/2020 CLINICAL DATA:  Hypoxia and shortness of breath.  Positive D-dimer. EXAM: CT ANGIOGRAPHY CHEST WITH CONTRAST TECHNIQUE: Multidetector CT imaging of the chest was performed using the standard protocol during bolus administration of intravenous contrast. Multiplanar CT image reconstructions and MIPs were obtained to evaluate the vascular  anatomy. CONTRAST:  [id number] OMNIPAQUE IOHEXOL 350 MG/ML SOLN COMPARISON:  10/09/2018 FINDINGS: Cardiovascular: Contrast injection is sufficient to demonstrate satisfactory opacification of the pulmonary arteries to the segmental level. There is no pulmonary embolus or evidence of right heart strain. The size of the main pulmonary artery is normal. Heart size is normal,  with no pericardial effusion. The course and caliber of the aorta are normal. There is no atherosclerotic calcification. Opacification decreased due to pulmonary arterial phase contrast bolus timing. Mediastinum/Nodes: Patulous esophagus. No mediastinal adenopathy. Normal thyroid. Lungs/Pleura: Left pleural effusion with consolidation/collapse much of the left lung. There is also consolidation in the right lower lobe. Upper Abdomen: Contrast bolus timing is not optimized for evaluation of the abdominal organs. The visualized portions of the organs of the upper abdomen are normal. Musculoskeletal: Diffuse muscular atrophy. Review of the MIP images confirms the above findings. IMPRESSION: 1. No pulmonary embolus. 2. Left pleural effusion and left-greater-than-right multifocal consolidation, likely indicating pneumonia. Electronically Signed   By: Deatra Robinson M.D.   On: 11/24/2020 21:31        Scheduled Meds: . enoxaparin (LOVENOX) injection  30 mg Subcutaneous Q12H  . insulin aspart  0-9 Units Subcutaneous TID WC  . Ipratropium-Albuterol  1 puff Inhalation Q6H  . methylPREDNISolone (SOLU-MEDROL) injection  40 mg Intravenous Q12H  . sodium chloride flush  10-40 mL Intracatheter Q12H   Continuous Infusions: . ampicillin-sulbactam (UNASYN) IV 3 g (11/26/20 1208)  . dextrose 5 % and 0.45 % NaCl with KCl 40 mEq/L 50 mL/hr at 11/26/20 1500     LOS: 4 days    Time spent: over 30 min    Lacretia Nicks, MD [facility]   To contact the attending provider between 7A-7P or the covering provider during after hours 7P-7A, please  log into the web site [email or web address] and access using universal [place] password for that web site. If you do not have the password, please call the hospital operator.  11/26/2020, 3:30 PM

## 2020-11-26 NOTE — Progress Notes (Signed)
WL 1424  - Civil engineer, contracting [facility]) - Hospitalized Hospice Patient Note:   Mr. Molyneux is a current hospice patient with a terminal diagnosis of peripheral artery disease. On 11/13/2020, staff at his facility found him to be confused, "blue with sats in the 50s and food smashed around his face". Staff immediately cleared his airway and provided oxygen. Mr. Eisler was increasingly agitated and they were unable to calm him down.  Staff called his sister Agustin Cree and the decision was made to transfer him to the ED as he seemed to be uncomfortable and agitated. He is being admitted with acute hypoxic respiratory failure secondary to aspiration PNA. This is a related hospital admission. Patient is a DNR.   Unable to visit patient at bedside due to positive Covid infection on 11/22/20 - patient is on airborne precautions.  Checked in with bedside RN for report: awaiting a call back at this time. Per EPIC notes: Patient is Alert/oriented xs4 with generalized weakness, experiencing a weak cough with rhonchi needing suctioning via yankauer, on telemetry monitoring with NSR noted this shift, medicated for pain/agitation early this morning. Patient current symptoms require continued GIP monitoring during this hospitalization - see MD EPIC notes below for POC.    V/S:  97.8, 131/95, 71, 27, sats 96% on 2L/min via [place]  I&O: 1099.07/1099 (-0.2) Abnormal lab work: K 2.9, Create 0.44, Cal 8.3, Glucose 262 Diagnostics:  11/24/20 CT angio chest: no pulmonary embolus, left pleural effusion/left greater than right multifocal consolidation indicating pneumonia Per SLP: Mod-severe oropharyngeal dysphagia with overt s/s potential aspiration/requiring some oral suctioining. His cough is weak, voice is clear but weak and he exhibits decreased laryngeal elevation per palpation.  SLP recommending NPO except small amount of ice chips PRN after oral care and when alert. IVs/PRNs: D5  NS withKCL at 50 ml/hr, KCL IVPB 10 meq in  adm at 1207 on 1/26, Dilaudid adm 0.5mg  at 0521 on 1/26, Ativan adm 1 mg at 1116 on 1/26   Active Problems per MD EPIC notes:   Pneumonia/Acute hypoxemic respiratory failure due to aspiration pneumonia and covid-19 pneumonia, left pleural effusion:  - Minimally hypoxic, will continue weaning efforts. Since not terribly symptomatic and a hospice patient, will not plan to drain pleural fluid. - Will complete 5th day of remdesivir/abx 1/26, then will request repeat SLP evaluation.  - Part of goals of care discussions included artificial feeding which the patient declines and is also unlikely to mitigate risk of aspiration. Covid-19 pneumonia: SARS-CoV-2 PCR positive on 1/22 despite vaccination reported by patient. Continue remdesivir x5 days (started 1/22).- Continue steroids. CRP improving 22 > 12 > 6 - Encourage OOB, IS, FV, and awake proning if able- Continue airborne, contact precautions  - Monitor CMP and inflammatory markers- D-dimer grossly elevated though no large PE on CTA and no DVT on U/S, note Hx DVT > continue lovenox, decrease to 0.5mg /kg q12h Atrial fibrillation/flutter with RVR: Improving.  - Continue telemetry. Minimizing nodal agents with relative hypotension currently.  Hypokalemia: - Supplement by IV Hypernatremia: - Continue hypotonic IVF and monitor Severe PAD: - Continue ASA, statin once able to take po. HLD: - Restart meds if/when taking po BPH: - Restart meds once taking po.  Muscular dystrophy, noted. Suggests that severe dysphagia is an irreversible process.  D/C planning- Patient is a resident at [facility] with hospice support, if medically stable to return - family wishes to continue hospice support.  Please use [facility] for all ACC patient transportation needs.  Goals of Care: Clear - patient is a DNR Communication with IDT- ACC team updated on patient condition. Communication with PCG- Spoke with patient sister Gavin Pound to offer support - family remains in  quarantine after visiting exposure to patient. Family has ACC contact information and encouraged to call for support/questions - family aware that ACC will continue to follow patient daily during this hospitalization.  Please call with any hospice related questions/concerns,   Roda Shutters, RN [facility] Liaison (in [place]) [phone]

## 2020-11-26 NOTE — Plan of Care (Signed)
  Problem: Education: Goal: Knowledge of risk factors and measures for prevention of condition will improve Outcome: Progressing   Problem: Coping: Goal: Psychosocial and spiritual needs will be supported Outcome: Progressing   Problem: Respiratory: Goal: Will maintain a patent airway Outcome: Progressing Goal: Complications related to the disease process, condition or treatment will be avoided or minimized Outcome: Progressing   Problem: Activity: Goal: Ability to tolerate increased activity will improve Outcome: Progressing   Problem: Clinical Measurements: Goal: Ability to maintain a body temperature in the normal range will improve Outcome: Progressing   Problem: Respiratory: Goal: Ability to maintain adequate ventilation will improve Outcome: Progressing Goal: Ability to maintain a clear airway will improve Outcome: Progressing   

## 2020-11-27 ENCOUNTER — Inpatient Hospital Stay (HOSPITAL_COMMUNITY)

## 2020-11-27 DIAGNOSIS — J690 Pneumonitis due to inhalation of food and vomit: Secondary | ICD-10-CM | POA: Diagnosis not present

## 2020-11-27 LAB — COMPREHENSIVE METABOLIC PANEL
ALT: 57 U/L — ABNORMAL HIGH (ref 0–44)
AST: 44 U/L — ABNORMAL HIGH (ref 15–41)
Albumin: 3.1 g/dL — ABNORMAL LOW (ref 3.5–5.0)
Alkaline Phosphatase: 76 U/L (ref 38–126)
Anion gap: 12 (ref 5–15)
BUN: 10 mg/dL (ref 6–20)
CO2: 29 mmol/L (ref 22–32)
Calcium: 8.6 mg/dL — ABNORMAL LOW (ref 8.9–10.3)
Chloride: 103 mmol/L (ref 98–111)
Creatinine, Ser: 0.5 mg/dL — ABNORMAL LOW (ref 0.61–1.24)
GFR, Estimated: >60 mL/min (ref >60)
Glucose, Bld: 239 mg/dL — ABNORMAL HIGH (ref 70–99)
Potassium: 3.4 mmol/L — ABNORMAL LOW (ref 3.5–5.1)
Sodium: 144 mmol/L (ref 135–145)
Total Bilirubin: 1.1 mg/dL (ref 0.3–1.2)
Total Protein: 6 g/dL — ABNORMAL LOW (ref 6.5–8.1)

## 2020-11-27 LAB — C-REACTIVE PROTEIN: CRP: 1.5 mg/dL — ABNORMAL HIGH (ref <1.0)

## 2020-11-27 LAB — CBC WITH DIFFERENTIAL/PLATELET
Abs Immature Granulocytes: 0.08 10*3/uL — ABNORMAL HIGH (ref 0.00–0.07)
Basophils Absolute: 0 10*3/uL (ref 0.0–0.1)
Basophils Relative: 0 %
Eosinophils Absolute: 0 10*3/uL (ref 0.0–0.5)
Eosinophils Relative: 0 %
HCT: 53.4 % — ABNORMAL HIGH (ref 39.0–52.0)
Hemoglobin: 17 g/dL (ref 13.0–17.0)
Immature Granulocytes: 1 %
Lymphocytes Relative: 4 %
Lymphs Abs: 0.4 10*3/uL — ABNORMAL LOW (ref 0.7–4.0)
MCH: 30.3 pg (ref 26.0–34.0)
MCHC: 31.8 g/dL (ref 30.0–36.0)
MCV: 95.2 fL (ref 80.0–100.0)
Monocytes Absolute: 0.6 10*3/uL (ref 0.1–1.0)
Monocytes Relative: 6 %
Neutro Abs: 8.2 10*3/uL — ABNORMAL HIGH (ref 1.7–7.7)
Neutrophils Relative %: 89 %
Platelets: 228 10*3/uL (ref 150–400)
RBC: 5.61 MIL/uL (ref 4.22–5.81)
RDW: 15.7 % — ABNORMAL HIGH (ref 11.5–15.5)
WBC: 9.2 10*3/uL (ref 4.0–10.5)
nRBC: 0 % (ref 0.0–0.2)

## 2020-11-27 LAB — FERRITIN: Ferritin: 216 ng/mL (ref 24–336)

## 2020-11-27 LAB — GLUCOSE, CAPILLARY
Glucose-Capillary: 226 mg/dL — ABNORMAL HIGH (ref 70–99)
Glucose-Capillary: 242 mg/dL — ABNORMAL HIGH (ref 70–99)
Glucose-Capillary: 235 mg/dL — ABNORMAL HIGH (ref 70–99)

## 2020-11-27 LAB — MAGNESIUM: Magnesium: 2 mg/dL (ref 1.7–2.4)

## 2020-11-27 LAB — D-DIMER, QUANTITATIVE: D-Dimer, Quant: 3.14 ug/mL-FEU — ABNORMAL HIGH (ref 0.00–0.50)

## 2020-11-27 LAB — PHOSPHORUS: Phosphorus: <1 mg/dL — CL (ref 2.5–4.6)

## 2020-11-27 MED ORDER — ACETAMINOPHEN 325 MG PO TABS: 650.0000 mg | ORAL_TABLET | Freq: Four times a day (QID) | ORAL | Status: DC | PRN

## 2020-11-27 MED ORDER — GLYCOPYRROLATE 0.2 MG/ML IJ SOLN: 0.2000 mg | INTRAMUSCULAR | Status: DC | PRN

## 2020-11-27 MED ORDER — ONDANSETRON HCL 4 MG/2ML IJ SOLN: 4.0000 mg | Freq: Four times a day (QID) | INTRAMUSCULAR | Status: DC | PRN

## 2020-11-27 MED ORDER — POTASSIUM PHOSPHATES 15 MMOLE/5ML IV SOLN
30.0000 mmol | Freq: Once | INTRAVENOUS | Status: AC
  Administered 2020-11-27: 30 mmol via INTRAVENOUS
  Filled 2020-11-27: qty 10

## 2020-11-27 MED ORDER — GLYCOPYRROLATE 1 MG PO TABS
1.0000 mg | ORAL_TABLET | ORAL | Status: DC | PRN
Start: 2020-11-27 — End: 2020-11-28
  Filled 2020-11-27: qty 1

## 2020-11-27 MED ORDER — POLYVINYL ALCOHOL 1.4 % OP SOLN
1.0000 [drp] | Freq: Four times a day (QID) | OPHTHALMIC | Status: DC | PRN
  Filled 2020-11-27: qty 15

## 2020-11-27 MED ORDER — ACETAMINOPHEN 650 MG RE SUPP: 650.0000 mg | Freq: Four times a day (QID) | RECTAL | Status: DC | PRN

## 2020-11-27 MED ORDER — HALOPERIDOL LACTATE 5 MG/ML IJ SOLN: 0.5000 mg | INTRAMUSCULAR | Status: DC | PRN

## 2020-11-27 MED ORDER — GLYCOPYRROLATE 0.2 MG/ML IJ SOLN
0.2000 mg | INTRAMUSCULAR | Status: DC | PRN
Start: 2020-11-27 — End: 2020-11-28

## 2020-11-27 MED ORDER — K PHOS MONO-SOD PHOS DI & MONO 155-852-130 MG PO TABS
500.0000 mg | ORAL_TABLET | Freq: Three times a day (TID) | ORAL | Status: DC
  Filled 2020-11-27: qty 2

## 2020-11-27 MED ORDER — BIOTENE DRY MOUTH MT LIQD: 15.0000 mL | OROMUCOSAL | Status: DC | PRN

## 2020-11-27 MED ORDER — ONDANSETRON 4 MG PO TBDP: 4.0000 mg | ORAL_TABLET | Freq: Four times a day (QID) | ORAL | Status: DC | PRN

## 2020-11-27 MED ORDER — HALOPERIDOL LACTATE 2 MG/ML PO CONC
0.5000 mg | ORAL | Status: DC | PRN
  Filled 2020-11-27: qty 0.3

## 2020-11-27 MED ORDER — HALOPERIDOL 0.5 MG PO TABS
0.5000 mg | ORAL_TABLET | ORAL | Status: DC | PRN
  Filled 2020-11-27: qty 1

## 2020-11-27 NOTE — Plan of Care (Signed)
  Problem: Education: Goal: Knowledge of risk factors and measures for prevention of condition will improve Outcome: Progressing   Problem: Coping: Goal: Psychosocial and spiritual needs will be supported Outcome: Progressing   Problem: Respiratory: Goal: Will maintain a patent airway Outcome: Progressing Goal: Complications related to the disease process, condition or treatment will be avoided or minimized Outcome: Progressing   Problem: Activity: Goal: Ability to tolerate increased activity will improve Outcome: Progressing   Problem: Clinical Measurements: Goal: Ability to maintain a body temperature in the normal range will improve Outcome: Progressing   Problem: Respiratory: Goal: Ability to maintain adequate ventilation will improve Outcome: Progressing Goal: Ability to maintain a clear airway will improve Outcome: Progressing   

## 2020-11-27 NOTE — TOC Initial Note (Addendum)
Transition of Care [facility]) - Initial/Assessment Note    Patient Details  Name: Malik [name] MRN: [id number] Date of Birth: 1966/07/11  Transition of Care [facility]) CM/SW Contact:    Darleene Cleaver, LCSW Phone Number: 11/27/2020, 5:01 PM  Clinical Narrative:                 Patient is a LTC at [facility], currently being followed by hospice through [employer].  Patient is alert and oriented x2.  Patient's plan is to either return back to SNF or transition to [facility].  CSW to continue to follow patient's progress throughout discharge planning.  Expected Discharge Plan: Hospice Medical Facility Barriers to Discharge: Continued Medical Work up   Patient Goals and CMS Choice Patient states their goals for this hospitalization and ongoing recovery are:: To go to [facility] for end of life care. CMS Medicare.gov Compare Post Acute Care list provided to:: Patient Represenative (must comment) Choice offered to / list presented to : [facility] POA / Guardian  Expected Discharge Plan and Services Expected Discharge Plan: Hospice Medical Facility In-house Referral: Hospice / Palliative Care   Post Acute Care Choice: Hospice Living arrangements for the past 2 months: Skilled Nursing Facility                                      Prior Living Arrangements/Services Living arrangements for the past 2 months: Skilled Nursing Facility Lives with:: Facility Resident Patient language and need for interpreter reviewed:: Yes Do you feel safe going back to the place where you live?: No   Patient will go to [facility] once he has been 10 days post covid diagnosis, and once a bed is available for end of life care.  Need for Family Participation in Patient Care: Yes (Comment) Care giver support system in place?: Yes (comment)   Criminal Activity/Legal Involvement Pertinent to Current Situation/Hospitalization: No - Comment as needed  Activities of Daily Living Home Assistive  Devices/Equipment: Wheelchair,Other (Comment) (lift needed) ADL Screening (condition at time of admission) Patient's cognitive ability adequate to safely complete daily activities?: No Is the patient deaf or have difficulty hearing?: No Does the patient have difficulty seeing, even when wearing glasses/contacts?: No Does the patient have difficulty concentrating, remembering, or making decisions?: Yes Patient able to express need for assistance with ADLs?: No Does the patient have difficulty dressing or bathing?: Yes Independently performs ADLs?: No Communication: Dependent,Needs assistance Dressing (OT): Needs assistance,Dependent Grooming: Needs assistance,Dependent Feeding: Needs assistance,Dependent Bathing: Needs assistance,Dependent Toileting: Needs assistance,Dependent In/Out Bed: Needs assistance,Dependent Walks in Home: Dependent Does the patient have difficulty walking or climbing stairs?: Yes Weakness of Legs: Both Weakness of Arms/Hands: Both  Permission Sought/Granted Permission sought to share information with : Facility Teacher, music Permission granted to share information with : Yes, Release of Information Signed  Share Information with NAME: Sunny, Gains [phone] or Jolyn Nap   [phone]  Permission granted to share info w AGENCY: Hospice facility [facility]        Emotional Assessment Appearance:: Appears stated age   Affect (typically observed): Calm,Appropriate,Accepting Orientation: : Oriented to Self,Oriented to Place Alcohol / Substance Use: Not Applicable Psych Involvement: No (comment)  Admission diagnosis:  Pneumonia [J18.9] Aspiration pneumonia of both lower lobes, unspecified aspiration pneumonia type [facility]) [J69.0] Patient Active Problem List   Diagnosis Date Noted  . Pneumonia 11/22/2020  . Hypoxia 10/09/2018  .  Abdominal pain 01/06/2018  . Pressure injury of skin 01/06/2018  . Ureteral stone  with hydronephrosis 01/06/2018  . Hypernatremia 01/06/2018  . Bilateral ureteral calculi   . PAD (peripheral artery disease) (HCC) 06/07/2016  . Leg pain, bilateral 06/02/2016  . Esophageal obstruction due to food impaction   . DVT of upper extremity (deep vein thrombosis) (HCC) 01/29/2016  . Residence in long term care facility 03/04/2014  . EKG abnormality 11/09/2013  . Community acquired pneumonia 11/08/2013  . Palliative care encounter 06/22/2013  . Epidermal cyst 06/22/2013  . Skin lesion of hand 06/22/2013  . 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 Hypertension, benign 01/20/2012  . Unspecified vitamin D deficiency 08/05/2011  . Vitamin B 12 deficiency 07/14/2011  . 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 Muscle weakness (generalized) 04/16/2011  . Weight finding 04/16/2011  . History of tobacco use 04/16/2011   PCP:  [name], MD Pharmacy:   [facility] [id number] - [place], [place] - [address] AT [facility] [address] & [facility] [address] [place] [address] Phone: [phone] Fax: [phone]  [id number] [place], [place] - [address] AT [facility] OF [address] [place] [address] Phone: [phone] Fax: [phone]  [facility] [place], [place] - [address] [place] [address] Phone: [phone] Fax: [phone]  [facility] - [place], [place] - [address] [place] [address] Phone: [phone] Fax: [phone]     Social Determinants of Health (SDOH) Interventions    Readmission Risk Interventions No flowsheet data found.

## 2020-11-27 NOTE — Progress Notes (Signed)
[facility] 1424  - Civil engineer, contracting [facility]) - Hospitalized Hospice Patient Note:  [name] is a current hospice patient with a terminal diagnosis of peripheral artery disease. On 05-Dec-2020, staff at his facility found him to be confused, "blue with sats in the 50s and food smashed around his face". Staff immediately cleared his airway and provided oxygen. [name] was increasingly agitated and they were unable to calm him down.  Staff called his sister Malik [name] and the decision was made to transfer him to the ED as he seemed to be uncomfortable and agitated. He is being admitted with acute hypoxic respiratory failure secondary to aspiration PNA. This is a related hospital admission. Patient is a DNR.  Unable to visit patient at bedside due to Covid possitive infection on 11/22/20- patient is on airborne precautions.  Spoke with Malik [name] regarding patients condition. Patient continues to aspirate. Malik [name] is recommending comfort feeds and transfer to [facility]. Patient is orient to person,place. Disoriented to time and situation. Slurred speech.   VS: 97, 108/90, 77, 24, 97% 2Lnc I/O: 1066/750  Abnormal Labs: 11/27/2020 05:20 Potassium: 3.4 (L) Glucose: 239 (H) Creatinine: 0.50 (L) Calcium: 8.6 (L) Phosphorus: <1.0 (LL) Albumin: 3.1 (L) AST: 44 (H) ALT: 57 (H) Total Protein: 6.0 (L) CRP: 1.5 (H) HCT: 53.4 (H) RDW: 15.7 (H) NEUT#: 8.2 (H) Lymphocyte #: 0.4 (L) Abs Immature Granulocytes: 0.08 (H) D-Dimer, Quant: 3.14 (H)  Epic Imaging CXR FINDINGS: Unchanged mild cardiomegaly. Interval increase of left lung opacity. Strandy opacities at the right lung base are unchanged. IMPRESSION: Interval increase of left basilar opacity likely due to worsening pleural effusion. Similar degree of airspace opacity still present within both lungs.  Problem list:   Pneumonia  Acute hypoxemic respiratory failure due to aspiration pneumonia and covid-19 pneumonia, left pleural  effusion  Aspiration pneumonia, recurrent, severe dysphagia  Covid-19 pneumonia  Atrial fibrillation/flutter with RVR  Hypokalemia  Hypernatremia  D/C planning- Patient is now being recommended to transfer to [facility] at [facility].    Goals of Care: Clear - patient is a DNR Communication with IDT- ACC team updated on patient condition. Communication with PCG- Spoke with patient sister Malik [name].   Please use [facility] if ambulance transport is needed at discharge.  A Please do not hesitate to call with questions.    Thank you,    Elsie Saas, RN, [facility]       [facility] Liaison (listed on [facility] under Hospice /[facility])     [phone]

## 2020-11-27 NOTE — TOC Progression Note (Signed)
Transition of Care [facility]) - Progression Note    Patient Details  Name: Malik [name] MRN: [id number] Date of Birth: 12-08-1965  Transition of Care [facility]) CM/SW Contact  Darleene Cleaver, [place] Phone Number: 11/27/2020, 5:13 PM  Clinical Narrative:     CSW was informed that patient and family have now decided they want to go to [facility] for hospice care.  Per [facility], patient has to be 10 days post first Covid result before they can be accepted to hospice facility.  CSW to continue to follow patient's progress throughout discharge planning.  Expected Discharge Plan: Hospice Medical Facility Barriers to Discharge: Continued Medical Work up  Expected Discharge Plan and Services Expected Discharge Plan: Hospice Medical Facility In-house Referral: Hospice / Palliative Care   Post Acute Care Choice: Hospice Living arrangements for the past 2 months: Skilled Nursing Facility                                       Social Determinants of Health (SDOH) Interventions    Readmission Risk Interventions No flowsheet data found.

## 2020-11-27 NOTE — Progress Notes (Addendum)
Speech Language Pathology Treatment: Dysphagia  Patient Details Name: Malik [name] MRN: [id number] DOB: [birth year] Today's Date: 11/27/2020 Time: 9242-6834 SLP Time Calculation (min) (ACUTE ONLY): 23 min  Assessment / Plan / Recommendation Clinical Impression  Upon entrance to room, pt lying in bed with eyes closed, upon moderately loud verbal stimulation, pt inhaled deeply and heart rate elevated from 59.    Pt declined to consume any po that SLP had offered him after oral care including ice chips, juice, applesauce, etc.  He allowed minimal oral care but would only accept with HOB lowered to 35*.  Baseline weak voice that was intermittently wet and did not clear with cued cough.  With oral moisture from toothette, pt with delayed swallow followed by throat clearing and weak nonprotective cough.  Pt repeatedly stated "I just want to sit her and rest" despite being informed of need to reassess swallowing.  Pt continues with difficulty managing secretions and weak cough impairing pulmonary clearance.  He likely is asprating secretions and this will not be prevented.  With total cues, pt makes effort to cough but can not clear.  Do not recommend MBS nor further SLP as pt with likley irreversible dysphagia due to his muscular dystrophy.  SLP advised him that he may be informed that he has reached his maximum benefit of COVID treatment and given his desire for absolutely NO feeding tube (which SLP endorses would not change his outcomes) comfort intake with accepted aspiration risk.   Encouraged pt to continue to attempt to cough and expectorate as able for comfort/ease of respiratory status.    Pt requested recurrently for [facility] to be elevated, lowered- which SLP provided x6 times and for him to be slid up in bed - which SLP provided x4 but he slides down everytime and for bed to be moved to be straight to tv *as he looked to the window.  This communication causes SLP to question pt's cognition - Nurse  who worked with pt Tuesday endorses same behavior.     At this time, pt does verbalize he desires "comfort intake" with accepted aspiration risk and follow up with hospice per SLP direct question.   Will sign off as all education completed and spoke to RN, MD re: results of session.     HPI HPI: Patient is a 55 y.o. 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 CXR revealed bibasilar infiltrates and hypoxia was confirmed. COVID-19 testing was positive and inflammatory markers were grossly elevated. Patient was made NPO pending SLP evaluation. MBS in 2019 revealed mild oral and moderately severe pharyngo-esophageal dysphagia with gross pharyngeal residuals which patient had difficulty clearing and concern noted for dysphagia impacting respiratory and nutritional status. Recommend modified diet with strict precautions.  Now pt being followed up inhouse after BSE on Monday 1/24 with recommendation for NPO.  Pt has deemed he does NOT want any feeding tubes but also did not want to eat for "comfort".  MD ordered SLP to follow up for reevaluation Thursday 11/28/2019.      SLP Plan  Discharge SLP treatment due to (comment) (pt's lack of participation and his verbalization of comfort po goal)       Recommendations  Diet recommendations:  (? comfort po) Medication Administration: Via alternative means                Oral Care Recommendations:  Oral care QID;Staff/trained caregiver to provide oral care Follow up Recommendations: Other (comment) (TBD) SLP Visit Diagnosis: Dysphagia, pharyngeal phase (R13.13);Dysphagia, pharyngoesophageal phase (R13.14) Plan: Discharge SLP treatment due to (comment) (pt's lack of participation and his verbalization of comfort po goal)       GO                [name] 11/27/2020, 10:50 AM   [name], MS [facility] SLP Acute  Rehab Services Office [phone] Pager [phone]

## 2020-11-27 NOTE — Progress Notes (Signed)
PROGRESS NOTE    Malik [name]  LOV:[id number] DOB: [birth year] DOA: 12/06/2020 PCP: [name], MD   Chief Complaint  Patient presents with  . Aspiration    Malik [name]    Brief Narrative:  Malik [name] is Malik [name] 55 y.o. male with Malik [name] history of muscular dystrophy, dysphagia, severe PAD on hospice at facility, and HTN who was brought to the ED 1/21 after appearing cyanotic and severely hypoxic with high suspicion for aspiration. CXR demonstrated bibasilar infiltrates and hypoxia was confirmed, screening SARS-CoV-2 PCR was positive and inflammatory markers are grossly elevated. Unasyn, remdesivir, and solumedrol have been given with stability in respiratory status. He remains NPO in the setting of very severe aspiration during SLP evaluation. Required suctioning with 4 ice chips. Goals of care counseling has been continued daily. Currently the patient/family's wishes are to continue NPO, IVF, IV abx and recheck SLP evaluation later this week. If aspiration risk remains severe, plan is to transition formally to hospice.  Assessment & Plan:   Active Problems:   Pneumonia  Goal of Care: SLP eval today and recommended follow up with hospice as well as comfort intake per pt desire with accepted aspiration risk.  I had conversation with Malik [name] about his diagnosis and next steps.  Discussed his swallowing and speech recommendations for comfort feeding and hospice follow up, which he notes understanding.  He hasn't improved with treatment of aspiration pneumonia and covid.  He's not interested in alternative means of feeding.  Discussed L sided opacity with effusion (he's not interested in thoracentesis for diagnostic or therapeutic purposes).  I discussed with his aunt on speaker phone plan for next steps (due to occasional confusion).  I think with his dysphagia and aspiration pneumonia in setting of his muscular dystrophy, inpatient hospice is appropriate at this time.  Planning for  comfort measures and [facility], discussed with pt, Malik [name] who were in agreement Malik [name] notes that they had tried to get him to [facility] before hospital admission).   Comfort measures [facility] when bed available  Acute hypoxemic respiratory failure due to aspiration pneumonia and covid-19 pneumonia, left pleural effusion:  - Minimally hypoxic, currently requiring 2 L, will continue weaning efforts.  - CT chest with L pleural effusion and L>R multifocal consolidation  - CXR 1/27 increase of L basilar opacity, likely worsening effusion - Treat conditions as below - discussed possibility of thoracentesis (therapeutic or diagnostic), which he declined  Aspiration pneumonia, recurrent, severe dysphagia:  - s/p unasyn.  - Aspiration precautions. Suction available at bedside constantly. - Pt is at very high risk of aspirating due to irreversible dystrophy. Hospice is on board.  Plan at this point is for comfort feeds and inpatient hospice. Per SLP: Patient presents with Malik [name] mod-severe oropharyngeal dysphagia with overt s/s potential aspiration and/or penetration with small amount of ice chips. Patient exhibited delayed throat clearing and cough with ice chips (4 ice chips total) and did require some oral suctioining as he was able to cough up some secretions. His cough is weak, voice is clear but weak and he exhibits decreased laryngeal elevation per palpation. He had MBS in 2019 which revealed mod-severe pharyngeal phase dysphagia with gross pharyngeal secretions that patient had difficulty clearing. SLP recommending NPO except small amount of ice chips PRN after oral care and when alert. SLP reeval 1/27: continues with difficult managing secretions and weak cough impairing pulm clearance.   No recommendation for MBS or further SLP as likely irreversible dysphagia  due to muscular dystrophy.  Plan for comfort feeds and follow up with hospice.   - completed 5th day of remdesivir/abx 1/26, then  will request repeat SLP evaluation on 1/27.  - Per previous provider "part of goals of care discussions included artificial feeding which the patient declines and is also unlikely to mitigate risk of aspiration".  Covid-19 pneumonia: SARS-CoV-2 PCR positive on 1/22 despite vaccination reported by patient.  - Continue remdesivir x5 days (1/22-26) - s/p steroids. CRP improving. - Encourage OOB, IS, FV, and awake proning if able - strict I/O, daily weights - Continue airborne, contact precautions   - Monitor CMP and inflammatory markers - D-dimer grossly elevated though no large PE on CTA and no DVT on U/S, note Hx DVT > continue lovenox, decrease to 0.5mg /kg q12h  COVID-19 Labs  Recent Labs    11/25/20 0357 11/26/20 0500 11/27/20 0520  DDIMER 4.84* 3.59* 3.14*  FERRITIN  --   --  216  CRP 6.0* 2.6* 1.5*    Lab Results  Component Value Date   SARSCOV2NAA POSITIVE ([place]) 11/22/2020   Atrial fibrillation/flutter with RVR: Improving.  - Continue telemetry. Minimizing nodal agents with relative hypotension currently.   Hypokalemia:  - Supplement by IV  Hypernatremia:  - Continue hypotonic IVF and monitor  Severe PAD:  - Continue ASA, statin once able to take po.  HLD:  - Restart meds if/when taking po  BPH:  - Restart meds once taking po.   Muscular dystrophy, noted. Suggests that severe dysphagia is an irreversible process.   DVT prophylaxis: lovenox Code Status: DNR Family Communication: none at bedside - Malik [name], aunt Disposition:   Status is: Inpatient  Remains inpatient appropriate because:Inpatient level of care appropriate due to severity of illness   Dispo: The patient is from: long term care, hospice              Anticipated d/c is to: pending              Anticipated d/c date is: > 3 days              Patient currently is not medically stable to d/c.   Difficult to place patient No  Consultants:   none  Procedures: LE [place] Summary:  RIGHT:   - No cystic structure found in the popliteal fossa.  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   Antimicrobials:  Anti-infectives (From admission, onward)   Start     Dose/Rate Route Frequency Ordered Stop   11/23/20 1000  remdesivir 100 mg in sodium chloride 0.9 % 100 mL IVPB       "Followed by" Linked Group Details   100 mg 200 mL/hr over 30 Minutes Intravenous Daily 11/22/20 1757 11/26/20 1130   11/22/20 1930  remdesivir 200 mg in sodium chloride 0.9% 250 mL IVPB       "Followed by" Linked Group Details   200 mg 580 mL/hr over 30 Minutes Intravenous Once 11/22/20 1757 11/22/20 2052   11/22/20 1215  Ampicillin-Sulbactam (UNASYN) 3 g in sodium chloride 0.9 % 100 mL IVPB        3 g 200 mL/hr over 30 Minutes Intravenous Every 6 hours 11/22/20 1210 11/27/20 0622   11/22/20 0015  cefTRIAXone (ROCEPHIN) 2 g  in sodium chloride 0.9 % 100 mL IVPB        2 g 200 mL/hr over 30 Minutes Intravenous  Once 11/22/20 0010 11/22/20 0106         Subjective: "same old same old" Discussed SLP eval He's not interested in feeding tubes or thoracentesis Understand his swallowing not better Discussed recommendation for hospice/[facility] with aunt - he was agreeable  Objective: Vitals:   11/26/20 1700 11/26/20 2232 11/27/20 0200 11/27/20 0625  BP:  (!) 120/93 118/88 108/90  Pulse:  (!) 116 70 77  Resp:  (!) 22 (!) 24 (!) 24  Temp:   (!) 97 F (36.1 C)   TempSrc:   Axillary   SpO2: 94% 91% 95% 97%  Weight:      Height:        Intake/Output Summary (Last 24 hours) at 11/27/2020 1103 Last data filed at 11/26/2020 2200 Gross per 24 hour  Intake 1066.7 ml  Output 750 ml  Net 316.7 ml   Filed Weights   11/22/20 1806  Weight: 59 kg    Examination:  General: No acute  distress. Cardiovascular: RRR Lungs: unlabored, on 2 L [place] Abdomen: Soft, nontender, nondistended Neurological: Alert, though some intermittent confusion. Moves all extremities 4. Cranial nerves II through XII grossly intact. Skin: Warm and dry. No rashes or lesions. Extremities: No clubbing or cyanosis. No edema.  Data Reviewed: I have personally reviewed following labs and imaging studies  CBC: Recent Labs  Lab 11/22/20 0048 11/23/20 0811 11/24/20 0418 11/27/20 0520  WBC 8.7 9.3 6.3 9.2  NEUTROABS 7.6 8.8*  --  8.2*  HGB 18.4* 17.2* 17.2* 17.0  HCT 58.9* 53.7* 54.0* 53.4*  MCV 99.0 95.6 96.4 95.2  PLT 163 158 179 228    Basic Metabolic Panel: Recent Labs  Lab 11/23/20 0811 11/24/20 0418 11/25/20 0357 11/26/20 0500 11/26/20 2006 11/27/20 0520  NA 145 148* 144 145 144 144  K 3.7 3.3* 3.8 2.9* 3.4* 3.4*  CL 106 105 102 103 103 103  CO2 24 28 27 30 30 29   GLUCOSE 153* 174* 224* 262* 199* 239*  BUN 12 17 11 11 8 10   CREATININE 0.53* 0.55* 0.35* 0.44* 0.36* 0.50*  CALCIUM 9.1 9.1 8.4* 8.3* 8.4* 8.6*  MG 1.9  --   --   --   --  2.0  PHOS  --   --   --   --   --  <1.0*    GFR: Estimated Creatinine Clearance: 86 mL/min (Anaika Santillano) (by C-G formula based on SCr of 0.5 mg/dL (L)).  Liver Function Tests: Recent Labs  Lab 11/23/20 0811 11/24/20 0418 11/25/20 0357 11/26/20 0500 11/27/20 0520  AST 58* 43* 45* 40 44*  ALT 79* 67* 59* 53* 57*  ALKPHOS 119 110 89 81 76  BILITOT 1.3* 1.2 1.1 1.2 1.1  PROT 6.5 6.9 5.9* 5.8* 6.0*  ALBUMIN 3.3* 3.6 3.1* 2.9* 3.1*    CBG: Recent Labs  Lab 11/26/20 0807 11/26/20 1200 11/26/20 1657 11/26/20 2136 11/27/20 0736  GLUCAP 252* 201* 218* 207* 226*     Recent Results (from the past 240 hour(s))  SARS CORONAVIRUS 2 (TAT 6-24 HRS) Nasopharyngeal Nasopharyngeal Swab     Status: Abnormal   Collection Time: 11/22/20 12:48 AM   Specimen: Nasopharyngeal Swab  Result Value Ref Range Status   SARS Coronavirus 2 POSITIVE (Jenavi Beedle) NEGATIVE  Final    Comment: (NOTE) SARS-CoV-2 target nucleic acids are DETECTED.  The SARS-CoV-2 RNA is generally detectable  in upper and lower respiratory specimens during the acute phase of infection. Positive results are indicative of the presence of SARS-CoV-2 RNA. Clinical correlation with patient history and other diagnostic information is  necessary to determine patient infection status. Positive results do not rule out bacterial infection or co-infection with other viruses.  The expected result is Negative.  Fact Sheet for Patients: HairSlick.no  Fact Sheet for Healthcare Providers: quierodirigir.com  This test is not yet approved or cleared by the [place] FDA and  has been authorized for detection and/or diagnosis of SARS-CoV-2 by FDA under an Emergency Use Authorization (EUA). This EUA will remain  in effect (meaning this test can be used) for the duration of the COVID-19 declaration under Section 564(b)(1) of the Act, 21 U. S.C. section 360bbb-3(b)(1), unless the authorization is terminated or revoked sooner.   Performed at [facility], [address], [place], [place] [address]          Radiology Studies: DG CHEST PORT 1 VIEW  Result Date: 11/27/2020 CLINICAL DATA:  COVID Shortness of breath EXAM: PORTABLE CHEST 1 VIEW COMPARISON:  11/23/2020 FINDINGS: Unchanged mild cardiomegaly. Interval increase of left lung opacity. Strandy opacities at the right lung base are unchanged. IMPRESSION: Interval increase of left basilar opacity likely due to worsening pleural effusion. Similar degree of airspace opacity still present within both lungs. Electronically Signed   By: Acquanetta Belling M.D.   On: 11/27/2020 07:43        Scheduled Meds: . enoxaparin (LOVENOX) injection  30 mg Subcutaneous Q12H  . insulin aspart  0-9 Units Subcutaneous TID WC  . Ipratropium-Albuterol  1 puff Inhalation Q6H  . methylPREDNISolone  (SOLU-MEDROL) injection  40 mg Intravenous Q12H  . sodium chloride flush  10-40 mL Intracatheter Q12H   Continuous Infusions: . potassium PHOSPHATE IVPB (in mmol) 30 mmol (11/27/20 0821)     LOS: 5 days    Time spent: over 30 min    Lacretia Nicks, MD [facility]   To contact the attending provider between 7A-7P or the covering provider during after hours 7P-7A, please log into the web site [email or web address] and access using universal [place] password for that web site. If you do not have the password, please call the hospital operator.  11/27/2020, 11:03 AM

## 2020-11-28 DIAGNOSIS — U071 COVID-19: Secondary | ICD-10-CM

## 2020-11-28 DIAGNOSIS — Z515 Encounter for palliative care: Secondary | ICD-10-CM

## 2020-12-02 NOTE — Progress Notes (Signed)
Entered room to give combivent inhaler and patient found to be unresponsive with no pulse and no respirations.  Contacted charge nurse for notification as well as on-call provider.  Second nurse verifier with [employer].  Called next of kin Ashely Joshua (aunt) for notification as well as Leotis Pain (sister).  Gavin Pound will come to visit in approximately 1 hour, informed her that would be fine.  Post mortem care provided by nurse and nurse tech.  Gavin Pound and spouse arrived around 2:30am to visit.  At approximately 3:15am Gavin Pound and spouse were leaving and provided name of funeral home [employer] [facility] [employer], only patient belongings were a pair of white socks which she did not want to take, notified charge nurse for patient placement notification.  Patient placed in body bag per protocol.  Transport to floor to take body to morgue at 5am.

## 2020-12-02 NOTE — Death Summary Note (Signed)
DEATH SUMMARY   Patient Details  Name: Malik [name] MRN: [id number] DOB: [birth year]  Admission/Discharge Information   Admit Date:  2020-12-12  Date of Death: Date of Death: 19-Dec-2020  Time of Death: Time of Death: 0110  Length of Stay: 6  Referring Physician: [name], MD   Reason(s) for Hospitalization  COVID, aspiration pneumonia, muscular dystrophy  Diagnoses  Preliminary cause of death:  Secondary Diagnoses (including complications and co-morbidities):  Active Problems:   Muscle weakness (generalized)   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HCC)   Aspiration pneumonia of both lower lobes (HCC)   COVID-19 virus infection   Hospice care patient   Brief Hospital Course (including significant findings, care, treatment, and services provided and events leading to death)  Malik [name] 55 y.o.malewith Malik [name] history of muscular dystrophy, dysphagia, severe PAD on hospice at facility, and HTN who was brought to the ED 12/12/2022 after appearing cyanotic and severely hypoxic with high suspicion for aspiration. CXR demonstrated bibasilar infiltrates and hypoxia was confirmed, screening SARS-CoV-2 PCR was positive and inflammatory markers are grossly elevated. Unasyn, remdesivir, and solumedrol have been [name] with stability in respiratory status. He was kept NPOin the setting of very severe aspiration during SLP evaluation. Required suctioning with 4 ice chips. Goals of care counseling was continued. After reevaluation by SLP on December 18, 2022 (after 5 days treatment of covid and aspiration pneumonia) the plan was for comfort feeds and transition to comfort measures and inpatient hospice due to continued significant aspiration risk (after conversation with Malik [name] and Malik [name], his aunt).  He passed on Dec 18, 2026 overnight (per discussion with RN this AM, sounds like he requested his oxygen Malik off at sometime overnight and was found pulseless earlier this AM).  I called Malik [name] 12-19-2022 AM to express  my condolences and answer any questions she may have.  See below for additional details from last progress note  Goal of Care: SLP eval 18-Dec-2022 and recommended follow up with hospice as well as comfort intake per pt desire with accepted aspiration risk.  I had conversation with Malik [name] about his diagnosis and next steps.  Discussed his swallowing and speech recommendations for comfort feeding and hospice follow up, which he notes understanding.  He hasn't improved with treatment of aspiration pneumonia and covid.  He's not interested in alternative means of feeding.  Discussed L sided opacity with effusion (he's not interested in thoracentesis for diagnostic or therapeutic purposes).  I discussed with his aunt on speaker phone plan for next steps (due to his occasional confusion).  I think with his dysphagia and aspiration pneumonia in setting of his muscular dystrophy, inpatient hospice is appropriate at this time.  Planning for comfort measures and [facility], discussed with pt, Malik [name] who were in agreement Malik [name] notes that they had tried to get him to [facility] before hospital admission).   Comfort measures only on 18-Dec-2022 [facility] when bed available  Acute hypoxemic respiratory failure due to aspiration pneumonia and covid-19 pneumonia, left pleural effusion:  -Minimally hypoxic, currently requiring 2 L, will continue weaning efforts.  - CT chest with L pleural effusion and L>R multifocal consolidation  - CXR 2022-12-18 increase of L basilar opacity, likely worsening effusion - Treat conditions as below - discussed possibility of thoracentesis (therapeutic or diagnostic), which he declined  Aspiration pneumonia, recurrent, severe dysphagia:  - s/p unasyn.  - Aspiration precautions. Suction available at bedside constantly. - Pt is at very high risk of aspirating due to irreversible dystrophy. Hospice  is on board.  Plan at this point is for comfort feeds and inpatient hospice. Per SLP:  Patient presents with Malik [name] mod-severe oropharyngeal dysphagia with overt s/s potential aspiration and/or penetration with small amount of ice chips. Patient exhibited delayed throat clearing and cough with ice chips (4 ice chips total) and did require some oral suctioining as he was able to cough up some secretions. His cough is weak, voice is clear but weak and he exhibits decreased laryngeal elevation per palpation. He had MBS in 2019 which revealed mod-severe pharyngeal phase dysphagia with gross pharyngeal secretions that patient had difficulty clearing. SLP recommending NPO except small amount of ice chips PRN after oral care and when alert. SLP reeval 1/27: continues with difficult managing secretions and weak cough impairing pulm clearance.   No recommendation for MBS or further SLP as likely irreversible dysphagia due to muscular dystrophy.  Plan for comfort feeds and follow up with hospice.   - completed 5th day of remdesivir/abx 1/26, then will request repeat SLP evaluation on 1/27. - Per previous provider "part of goals of care discussions included artificial feeding which the patient declines and is also unlikely to mitigate risk of aspiration".  Covid-19 pneumonia: SARS-CoV-2 PCR positive on 1/22 despite vaccination reported by patient.  - Continue remdesivir x5 days (1/22-26) - s/p steroids. CRPimproving. - Encourage OOB, IS, FV, and awake proning if able - strict I/O, daily weights - Continue airborne, contact precautions  - Monitor CMP and inflammatory markers - D-dimer grossly elevatedthough no large PE on CTA and no DVT on U/S, note Hx DVT >continue lovenox, decrease to 0.5mg /kg q12h -> d/c with comfort measures  Atrial fibrillation/flutter with RVR: Improving.  - comfort measures  Hypokalemia:  - comfort measures  Hypernatremia:  -comfort measures  Severe PAD:  - comfort measures  HLD:  - cmo  BPH:  - cmo  Muscular dystrophy, noted. Suggests that severe  dysphagia is an irreversible process.     Pertinent Labs and Studies  Significant Diagnostic Studies CT ANGIO CHEST PE W OR WO CONTRAST  Result Date: 11/24/2020 CLINICAL DATA:  Hypoxia and shortness of breath.  Positive D-dimer. EXAM: CT ANGIOGRAPHY CHEST WITH CONTRAST TECHNIQUE: Multidetector CT imaging of the chest was performed using the standard protocol during bolus administration of intravenous contrast. Multiplanar CT image reconstructions and MIPs were obtained to evaluate the vascular anatomy. CONTRAST:  OMNIPAQUE IOHEXOL 350 MG/ML SOLN COMPARISON:  10/09/2018 FINDINGS: Cardiovascular: Contrast injection is sufficient to demonstrate satisfactory opacification of the pulmonary arteries to the segmental level. There is no pulmonary embolus or evidence of right heart strain. The size of the main pulmonary artery is normal. Heart size is normal, with no pericardial effusion. The course and caliber of the aorta are normal. There is no atherosclerotic calcification. Opacification decreased due to pulmonary arterial phase contrast bolus timing. Mediastinum/Nodes: Patulous esophagus. No mediastinal adenopathy. Normal thyroid. Lungs/Pleura: Left pleural effusion with consolidation/collapse much of the left lung. There is also consolidation in the right lower lobe. Upper Abdomen: Contrast bolus timing is not optimized for evaluation of the abdominal organs. The visualized portions of the organs of the upper abdomen are normal. Musculoskeletal: Diffuse muscular atrophy. Review of the MIP images confirms the above findings. IMPRESSION: 1. No pulmonary embolus. 2. Left pleural effusion and left-greater-than-right multifocal consolidation, likely indicating pneumonia. Electronically Signed   By: Deatra Robinson M.D.   On: 11/24/2020 21:31   DG CHEST PORT 1 VIEW  Result Date: 11/27/2020 CLINICAL DATA:  COVID  Shortness of breath EXAM: PORTABLE CHEST 1 VIEW COMPARISON:  11/23/2020 FINDINGS: Unchanged mild  cardiomegaly. Interval increase of left lung opacity. Strandy opacities at the right lung base are unchanged. IMPRESSION: Interval increase of left basilar opacity likely due to worsening pleural effusion. Similar degree of airspace opacity still present within both lungs. Electronically Signed   By: Acquanetta Belling M.D.   On: 11/27/2020 07:43   DG CHEST PORT 1 VIEW  Result Date: 11/23/2020 CLINICAL DATA:  Shortness of breath, COVID positive EXAM: PORTABLE CHEST 1 VIEW COMPARISON:  11/06/2020 FINDINGS: Mild patchy opacities in the bilateral perihilar regions and lower lobes, lingular and left lower lobe predominant, improved. Possible small left pleural effusion, improved. No pneumothorax. The heart is normal in size. Cholecystectomy clips. IMPRESSION: Multifocal pneumonia in this patient with known COVID, improved. Possible small left pleural effusion, improved. Electronically Signed   By: Charline Bills M.D.   On: 11/23/2020 08:59   DG Chest Port 1 View  Result Date: 11/22/2020 CLINICAL DATA:  Hypoxia EXAM: PORTABLE CHEST 1 VIEW COMPARISON:  CT chest 10/09/2018 FINDINGS: Lung volumes are small, but are symmetric. There is bibasilar atelectasis or infiltrate present. Small left pleural effusion present. No pneumothorax. Cardiac size within normal limits. Pulmonary vascularity is normal. IMPRESSION: Bibasilar atelectasis or infiltrate.  Small left pleural effusion. Electronically Signed   By: Helyn Numbers MD   On: 11/22/2020 00:02   VAS [place] LOWER EXTREMITY VENOUS (DVT)  Result Date: 11/23/2020  Lower Venous DVT Study Indications: Swelling. Other Indications: COVID Hospice patient. 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 Anticoagulation: Lovenox. Comparison Study: 01/2016 Performing Technologist: Clint Guy RVT  Examination Guidelines: Kamari Bilek complete evaluation includes B-mode imaging, spectral Doppler, color Doppler, and power Doppler as needed of all accessible portions of  each vessel. Bilateral testing is considered an integral part of Jaena Brocato complete examination. Limited examinations for reoccurring indications may Malik performed as noted. The reflux portion of the exam is performed with the patient in reverse Trendelenburg.  +---------+---------------+---------+-----------+----------+------------------+ RIGHT    CompressibilityPhasicitySpontaneityPropertiesThrombus Aging     +---------+---------------+---------+-----------+----------+------------------+ CFV      Full           Yes      Yes                                     +---------+---------------+---------+-----------+----------+------------------+ SFJ      Full                                                            +---------+---------------+---------+-----------+----------+------------------+ FV Prox  Full                                                            +---------+---------------+---------+-----------+----------+------------------+ FV Mid   Full                                                            +---------+---------------+---------+-----------+----------+------------------+  FV Distal                                             Post phlebotic                                                           syndrome           +---------+---------------+---------+-----------+----------+------------------+ PFV      Full                                                            +---------+---------------+---------+-----------+----------+------------------+ POP      Full           No       No                                      +---------+---------------+---------+-----------+----------+------------------+ PTV      Full                                                            +---------+---------------+---------+-----------+----------+------------------+ PERO     Full                                                             +---------+---------------+---------+-----------+----------+------------------+   +---------+---------------+---------+-----------+----------+------------------+ LEFT     CompressibilityPhasicitySpontaneityPropertiesThrombus Aging     +---------+---------------+---------+-----------+----------+------------------+ CFV      Full           Yes      Yes                                     +---------+---------------+---------+-----------+----------+------------------+ SFJ      Full                                                            +---------+---------------+---------+-----------+----------+------------------+ FV Prox                                               Post phlebotic  syndrome           +---------+---------------+---------+-----------+----------+------------------+ FV Mid                                                Post phlebotic                                                           syndrome           +---------+---------------+---------+-----------+----------+------------------+ FV Distal                                             Post phlebotic                                                           syndrome           +---------+---------------+---------+-----------+----------+------------------+ PFV      Full                                                            +---------+---------------+---------+-----------+----------+------------------+ POP      Full                                                            +---------+---------------+---------+-----------+----------+------------------+ PTV      Full                                                            +---------+---------------+---------+-----------+----------+------------------+ PERO     Full                                                             +---------+---------------+---------+-----------+----------+------------------+  Summary: RIGHT: - No cystic structure found in the popliteal fossa. 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 Electronically signed by Waverly Ferrari MD on 11/23/2020 at 4:29:05 PM.    Final     Microbiology  Recent Results (from the past 240 hour(s))  SARS CORONAVIRUS 2 (TAT 6-24 HRS) Nasopharyngeal Nasopharyngeal Swab     Status: Abnormal   Collection Time: 11/22/20 12:48 AM   Specimen: Nasopharyngeal Swab  Result Value Ref Range Status   SARS Coronavirus 2 POSITIVE (Antuan Limes) NEGATIVE Final    Comment: (NOTE) SARS-CoV-2 target nucleic acids are DETECTED.  The SARS-CoV-2 RNA is generally detectable in upper and lower respiratory specimens during the acute phase of infection. Positive results are indicative of the presence of SARS-CoV-2 RNA. Clinical correlation with patient history and other diagnostic information is  necessary to determine patient infection status. Positive results do not rule out bacterial infection or co-infection with other viruses.  The expected result is Negative.  Fact Sheet for Patients: HairSlick.nohttps://www.fda.gov/media/138098/download  Fact Sheet for Healthcare Providers: quierodirigir.comhttps://www.fda.gov/media/138095/download  This test is not yet approved or cleared by the [place] FDA and  has been authorized for detection and/or diagnosis of SARS-CoV-2 by FDA under an Emergency Use Authorization (EUA). This EUA will remain  in effect (meaning this test can Malik used) for the duration of the COVID-19 declaration under Section 564(b)(1) of the Act, 21 U. S.C. section 360bbb-3(b)(1), unless the authorization is terminated or revoked  sooner.   Performed at [facility], [address] [place], [place] [address]     Lab Basic Metabolic Panel: Recent Labs  Lab 11/23/20 [phone] 11/24/20 0418 11/25/20 0357 11/26/20 0500 11/26/20 2006 11/27/20 0520  NA 145 148* 144 145 144 144  K 3.7 3.3* 3.8 2.9* 3.4* 3.4*  CL 106 105 102 103 103 103  CO2 24 28 27 30 30 29   GLUCOSE 153* 174* 224* 262* 199* 239*  BUN 12 17 11 11 8 10   CREATININE 0.53* 0.55* 0.35* 0.44* 0.36* 0.50*  CALCIUM 9.1 9.1 8.4* 8.3* 8.4* 8.6*  MG 1.9  --   --   --   --  2.0  PHOS  --   --   --   --   --  <1.0*   Liver Function Tests: Recent Labs  Lab 11/23/20 0811 11/24/20 0418 11/25/20 0357 11/26/20 0500 11/27/20 0520  AST 58* 43* 45* 40 44*  ALT 79* 67* 59* 53* 57*  ALKPHOS 119 110 89 81 76  BILITOT 1.3* 1.2 1.1 1.2 1.1  PROT 6.5 6.9 5.9* 5.8* 6.0*  ALBUMIN 3.3* 3.6 3.1* 2.9* 3.1*   No results for input(s): LIPASE, AMYLASE in the last 168 hours. No results for input(s): AMMONIA in the last 168 hours. CBC: Recent Labs  Lab 11/22/20 0048 11/23/20 0811 11/24/20 0418 11/27/20 0520  WBC 8.7 9.3 6.3 9.2  NEUTROABS 7.6 8.8*  --  8.2*  HGB 18.4* 17.2* 17.2* 17.0  HCT 58.9* 53.7* 54.0* 53.4*  MCV 99.0 95.6 96.4 95.2  PLT 163 158 179 228   Cardiac Enzymes: No results for input(s): CKTOTAL, CKMB, CKMBINDEX, TROPONINI in the last 168 hours. Sepsis Labs: Recent Labs  Lab 11/22/20 0048 11/23/20 0811 11/24/20 0418 11/25/20 0357 11/27/20 0520  PROCALCITON  --  5.48 3.41 1.15  --   WBC 8.7 9.3 6.3  --  9.2    Procedures/Operations  See previous notes   Lacretia NicksCaldwell Powell 12/01/2020, 9:41 AM

## 2020-12-02 DEATH — deceased

## 2021-04-01 ENCOUNTER — Encounter (HOSPITAL_COMMUNITY): Payer: Medicaid Other
